# Patient Record
Sex: Female | Born: 1955 | Race: White | Hispanic: No | Marital: Single | Smoking: Former smoker
Health system: Southern US, Community
[De-identification: ages and names within clinical notes are randomized; demographics above are authoritative.]

## PROBLEM LIST (undated history)

## (undated) DIAGNOSIS — E079 Disorder of thyroid, unspecified: Secondary | ICD-10-CM

## (undated) DIAGNOSIS — M722 Plantar fascial fibromatosis: Secondary | ICD-10-CM

## (undated) DIAGNOSIS — C4490 Unspecified malignant neoplasm of skin, unspecified: Secondary | ICD-10-CM

## (undated) DIAGNOSIS — E7800 Pure hypercholesterolemia, unspecified: Secondary | ICD-10-CM

## (undated) DIAGNOSIS — E785 Hyperlipidemia, unspecified: Secondary | ICD-10-CM

## (undated) DIAGNOSIS — D179 Benign lipomatous neoplasm, unspecified: Secondary | ICD-10-CM

## (undated) DIAGNOSIS — F40240 Claustrophobia: Secondary | ICD-10-CM

## (undated) DIAGNOSIS — M545 Low back pain, unspecified: Secondary | ICD-10-CM

## (undated) DIAGNOSIS — R21 Rash and other nonspecific skin eruption: Secondary | ICD-10-CM

## (undated) DIAGNOSIS — J302 Other seasonal allergic rhinitis: Secondary | ICD-10-CM

## (undated) HISTORY — DX: Other seasonal allergic rhinitis: J30.2

## (undated) HISTORY — DX: Claustrophobia: F40.240

## (undated) HISTORY — PX: EYE SURGERY: SHX253

## (undated) HISTORY — PX: SKIN BIOPSY: SHX1

## (undated) HISTORY — DX: Hyperlipidemia, unspecified: E78.5

## (undated) HISTORY — DX: Plantar fascial fibromatosis: M72.2

## (undated) HISTORY — DX: Rash and other nonspecific skin eruption: R21

---

## 1996-05-18 ENCOUNTER — Emergency Department: Admit: 1996-05-18 | Payer: Self-pay | Admitting: Emergency Medicine

## 1999-04-23 ENCOUNTER — Ambulatory Visit
Admit: 1999-04-23 | Disposition: A | Discharge status: Home / Self Care | Payer: Self-pay | Source: Ambulatory Visit | Admitting: Gynecology

## 1999-05-07 ENCOUNTER — Ambulatory Visit
Admit: 1999-05-07 | Disposition: A | Discharge status: Home / Self Care | Payer: Self-pay | Source: Ambulatory Visit | Admitting: Gynecology

## 2000-08-10 ENCOUNTER — Ambulatory Visit
Admit: 2000-08-10 | Disposition: A | Discharge status: Home / Self Care | Payer: Self-pay | Source: Ambulatory Visit | Admitting: Gynecology

## 2001-06-09 HISTORY — PX: LASIK: SHX215

## 2001-11-04 ENCOUNTER — Emergency Department: Admit: 2001-11-04 | Payer: Self-pay | Source: Ambulatory Visit | Admitting: Emergency Medicine

## 2002-04-27 ENCOUNTER — Ambulatory Visit
Admit: 2002-04-27 | Disposition: A | Discharge status: Home / Self Care | Payer: Self-pay | Source: Ambulatory Visit | Admitting: Gynecology

## 2002-05-25 ENCOUNTER — Ambulatory Visit
Admit: 2002-05-25 | Disposition: A | Discharge status: Home / Self Care | Payer: Self-pay | Source: Ambulatory Visit | Admitting: Gynecology

## 2003-03-24 ENCOUNTER — Ambulatory Visit: Admit: 2003-03-24 | Disposition: A | Discharge status: Home / Self Care | Payer: Self-pay | Source: Ambulatory Visit

## 2003-05-05 ENCOUNTER — Ambulatory Visit
Admit: 2003-05-05 | Disposition: A | Discharge status: Home / Self Care | Payer: Self-pay | Source: Ambulatory Visit | Admitting: Gynecology

## 2004-05-23 ENCOUNTER — Ambulatory Visit
Admit: 2004-05-23 | Disposition: A | Discharge status: Home / Self Care | Payer: Self-pay | Source: Ambulatory Visit | Admitting: Gynecology

## 2005-05-05 ENCOUNTER — Ambulatory Visit
Admission: RE | Admit: 2005-05-05 | Disposition: A | Discharge status: Home / Self Care | Payer: Self-pay | Source: Ambulatory Visit | Admitting: Gastroenterology

## 2005-05-23 ENCOUNTER — Ambulatory Visit
Admit: 2005-05-23 | Disposition: A | Discharge status: Home / Self Care | Payer: Self-pay | Source: Ambulatory Visit | Admitting: Gynecology

## 2005-07-14 ENCOUNTER — Ambulatory Visit
Admit: 2005-07-14 | Disposition: A | Discharge status: Home / Self Care | Payer: Self-pay | Source: Ambulatory Visit | Admitting: Gynecology

## 2005-09-12 ENCOUNTER — Ambulatory Visit
Admit: 2005-09-12 | Disposition: A | Discharge status: Home / Self Care | Payer: Self-pay | Source: Ambulatory Visit | Admitting: Dermatology

## 2005-09-12 LAB — CBC WITH AUTO DIFFERENTIAL CERNER
Basophils Absolute: 0 /mm3 (ref 0.0–0.2)
Basophils: 1 % (ref 0–2)
Eosinophils Absolute: 0 /mm3 (ref 0.0–0.7)
Eosinophils: 1 % (ref 0–5)
Granulocytes Absolute: 2.8 /mm3 (ref 1.8–8.1)
Hematocrit: 35.2 % — ABNORMAL LOW (ref 37.0–47.0)
Hgb: 12 G/DL — ABNORMAL LOW (ref 13.0–17.0)
Lymphocytes Absolute: 1 /mm3 (ref 0.5–4.4)
Lymphocytes: 24 % (ref 15–41)
MCH: 34.4 PG — ABNORMAL HIGH (ref 28.0–32.0)
MCHC: 34.1 G/DL (ref 32.0–36.0)
MCV: 100.8 FL — ABNORMAL HIGH (ref 80.0–100.0)
MPV: 7.3 FL — ABNORMAL LOW (ref 7.4–10.4)
Monocytes Absolute: 0.3 /mm3 (ref 0.0–1.2)
Monocytes: 7 % (ref 0–11)
Neutrophils %: 68 % (ref 52–75)
Platelets: 280 /mm3 (ref 140–400)
RBC: 3.49 /mm3 — ABNORMAL LOW (ref 4.20–5.40)
RDW: 13.4 % (ref 11.5–15.0)
WBC: 4.1 /mm3 (ref 3.5–10.8)

## 2005-09-12 LAB — COMPREHENSIVE METABOLIC PANEL
ALT: 26 U/L (ref 9–52)
AST (SGOT): 26 U/L (ref 8–39)
Albumin/Globulin Ratio: 1.4 (ref 1.1–1.8)
Albumin: 3.9 G/DL (ref 3.7–5.1)
Alkaline Phosphatase: 51 U/L (ref 43–122)
BUN: 12 MG/DL (ref 7–21)
Bilirubin, Total: 0.2 MG/DL (ref 0.2–1.3)
CO2: 25 MEQ/L (ref 22–31)
Calcium: 8.9 MG/DL (ref 8.6–10.2)
Chloride: 105 MEQ/L (ref 98–107)
Creatinine: 0.9 MG/DL (ref 0.5–1.4)
Globulin: 2.8 G/DL (ref 2.0–3.7)
Glucose: 94 MG/DL (ref 70–105)
Potassium: 4.2 MEQ/L (ref 3.6–5.0)
Protein, Total: 6.7 G/DL (ref 6.0–8.0)
Sodium: 141 MEQ/L (ref 136–143)

## 2005-09-12 LAB — CALCIUM IONIZED-CALC. CERNER: Calcium Ionized Calculated: 2 mEQ/L (ref 1.9–2.3)

## 2005-09-12 LAB — GFR

## 2005-09-13 LAB — ANA SCREEN REFLEX

## 2005-09-14 LAB — SJOGREN'S SYNDROME AB CERNER

## 2005-09-15 LAB — STREPTOZYME HA: Streptozyme: NEGATIVE

## 2006-07-23 ENCOUNTER — Ambulatory Visit
Admit: 2006-07-23 | Disposition: A | Discharge status: Home / Self Care | Payer: Self-pay | Source: Ambulatory Visit | Admitting: Gynecology

## 2007-01-29 ENCOUNTER — Emergency Department: Admit: 2007-01-29 | Payer: Self-pay | Source: Emergency Department | Admitting: Emergency Medicine

## 2007-08-19 ENCOUNTER — Ambulatory Visit
Admit: 2007-08-19 | Disposition: A | Discharge status: Home / Self Care | Payer: Self-pay | Source: Ambulatory Visit | Admitting: Gynecology

## 2007-09-21 ENCOUNTER — Ambulatory Visit
Admit: 2007-09-21 | Disposition: A | Discharge status: Home / Self Care | Payer: Self-pay | Source: Ambulatory Visit | Admitting: Gynecology

## 2008-03-15 ENCOUNTER — Ambulatory Visit
Admit: 2008-03-15 | Disposition: A | Discharge status: Home / Self Care | Payer: Self-pay | Source: Ambulatory Visit | Admitting: Gynecology

## 2008-09-04 ENCOUNTER — Ambulatory Visit
Admit: 2008-09-04 | Disposition: A | Discharge status: Home / Self Care | Payer: Self-pay | Source: Ambulatory Visit | Admitting: Gynecology

## 2008-09-20 ENCOUNTER — Ambulatory Visit
Admit: 2008-09-20 | Disposition: A | Discharge status: Home / Self Care | Payer: Self-pay | Source: Ambulatory Visit | Admitting: Physician Assistant

## 2009-06-04 ENCOUNTER — Ambulatory Visit
Admit: 2009-06-04 | Disposition: A | Discharge status: Home / Self Care | Payer: Self-pay | Source: Ambulatory Visit | Admitting: Gynecology

## 2009-09-14 ENCOUNTER — Ambulatory Visit
Admit: 2009-09-14 | Disposition: A | Discharge status: Home / Self Care | Payer: Self-pay | Source: Ambulatory Visit | Admitting: Gynecology

## 2010-09-27 ENCOUNTER — Ambulatory Visit: Admit: 2010-09-27 | Discharge: 2010-09-27 | Payer: Self-pay | Source: Ambulatory Visit

## 2011-03-19 ENCOUNTER — Ambulatory Visit
Admit: 2011-03-19 | Discharge: 2011-03-19 | Disposition: A | Discharge status: Home / Self Care | Payer: Self-pay | Source: Ambulatory Visit

## 2011-10-02 ENCOUNTER — Ambulatory Visit
Admit: 2011-10-02 | Discharge: 2011-10-02 | Disposition: A | Discharge status: Home / Self Care | Payer: Self-pay | Source: Ambulatory Visit

## 2012-06-11 NOTE — Op Note (Signed)
PATIENTLEXA, [name]      MEDICAL RECORD NUMBER:         [id number]      PATIENT LOCATION/SERVICE:       [facility]            DATE OF SURGERY:               05/05/2005      SURGEON:                       Nadean Corwin, MD      ASSISTANT 1:            PREOPERATIVE DIAGNOSIS:  Hematochezia.            POSTOPERATIVE DIAGNOSIS:  Internal hemorrhoids.            PROCEDURE PERFORMED:  Colonoscopy.            ANESTHESIA:  IV general.            NOTE:  Informed consent was obtained prior to the procedure. Both risks and      benefits of the procedure were discussed with the patient. Potential      complications discussed included perforation, bleeding, abdominal pain,      adverse reaction to medications, and aspiration.  It was further explained      that any or all of these complications could result in the need for      extended hospitalization, emergency surgery, and/or transfusion with packed      red blood cells (with risk of HIV or hepatitis virus).  It was also      explained that an existing tumor, polyp, or mucosal abnormality might not      be identified at the time of the procedure, thus resulting in a missed      opportunity for early diagnosis and treatment of gastrointestinal      malignancy or disease as well as the development of a gastrointestinal      malignancy or disease.  Last it was explained that complications are not      limited to those listed above. The patient was given the opportunity to ask      questions and to have them answered as well as to discuss opportunity.            OPERATIVE FINDINGS:  The patient was placed in the left lateral decubitus      position, and conscious sedation was administered and titrated to comfort.      A rectal exam was performed which revealed normal tone and no masses.  The      Olympus PCF160AL video colonoscope was advanced under direct vision to the      level of the cecum.  The cecum was easily identified by internal  landmarks.      The appendiceal orifice and ileocecal valve were clearly visualized.  With      the colonoscope in the forward viewing position, it was slowly withdrawn      over a period of seven minutes. The cecum, ascending colon, hepatic      flexure, transverse colon, splenic flexure, descending colon, sigmoid      colon, rectosigmoid colon, as well as retroflexed view of the rectum, were      all fully visualized.  Retroflexed view of  the rectum revealed small,      nonbleeding internal hemorrhoids.  Other than this, there was a  normal      mucosal and vascular pattern throughout.  There were no ulcers, masses, or      polyps noted. The patient tolerated  the procedure well and was transported      to recovery without incident.            IMPRESSION:  Internal hemorrhoids.            RECOMMENDATIONS:      1.    The patient should have repeat colonoscopy in 7 to 10 years.      2.    The patient is to call me immediately post-procedure for nausea,            vomiting, abdominal pain, fever, shaking chills, or any change in            clinical condition.                  Electronic Signing MD: Nadean Corwin, MD                  DEL:amw:SC      D:    05/05/2005      T:    05/06/2005      #:    [id number]      N:    [id number]            cc:   Nadean Corwin, MD            Lurlean Leyden, MD

## 2012-09-30 ENCOUNTER — Other Ambulatory Visit: Payer: Self-pay | Admitting: Gynecology

## 2012-09-30 DIAGNOSIS — Z1231 Encounter for screening mammogram for malignant neoplasm of breast: Secondary | ICD-10-CM

## 2012-10-05 ENCOUNTER — Other Ambulatory Visit: Payer: Self-pay | Admitting: Surgery

## 2012-10-05 ENCOUNTER — Other Ambulatory Visit: Payer: Self-pay | Admitting: Gynecology

## 2012-10-05 ENCOUNTER — Ambulatory Visit
Admission: RE | Admit: 2012-10-05 | Discharge: 2012-10-05 | Disposition: A | Discharge status: Home / Self Care | Payer: BC Managed Care – PPO | Source: Ambulatory Visit | Attending: Gynecology | Admitting: Gynecology

## 2012-10-05 DIAGNOSIS — Z1231 Encounter for screening mammogram for malignant neoplasm of breast: Secondary | ICD-10-CM

## 2012-10-06 ENCOUNTER — Encounter (INDEPENDENT_AMBULATORY_CARE_PROVIDER_SITE_OTHER): Payer: Self-pay | Admitting: Family Medicine

## 2012-10-28 ENCOUNTER — Ambulatory Visit (INDEPENDENT_AMBULATORY_CARE_PROVIDER_SITE_OTHER): Payer: BC Managed Care – PPO | Admitting: Family Medicine

## 2012-10-28 ENCOUNTER — Encounter (INDEPENDENT_AMBULATORY_CARE_PROVIDER_SITE_OTHER): Payer: Self-pay | Admitting: Family Medicine

## 2012-10-28 VITALS — BP 132/84 | HR 94 | Temp 98.7°F | Resp 12 | Ht 61.5 in | Wt 126.0 lb

## 2012-10-28 NOTE — Progress Notes (Signed)
Subjective:       Patient ID: [name] [name] is a 57 y.o. female.    HPI  New patient.  Previously saw Dr. Baldwin [facility])    Reports has high cholesterol.  Reports has had for years.  Reports has been on lipitor off and on for a few years. ?maybe 10 years. Reports previous was given medication that started with z but reports it was ineffective.     Previous medical record was reviewed, patietn was previously given trilipix, fenofibrate, and zocor in the past.      Last lipid check in December 2013 and reports no change in med at that time.   From her previous lab results her triglycerides have been as high as ~ 450 and her LDL as low as single digit.     Reports that she saw gyn in 09/2012 and had endometrial biopsy. 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 Cutting. She was hesitant to return as she stated it was a painfulr procedure but states that Dr. Merdis Delay wanted her to return for another biopsy.     The following portions of the patient's history were reviewed and updated as appropriate: allergies, current medications, past family history, past medical history, past social history, past surgical history and problem list.    Review of Systems   Constitutional: Negative.    Respiratory: Negative for shortness of breath.    Cardiovascular: Negative for chest pain.   Musculoskeletal: Negative for myalgias.           Objective:    Physical Exam   Constitutional: She is oriented to person, place, and time.        Body mass index is 23.42 kg/(m^2).     Eyes: Conjunctivae normal are normal.   Neck: Neck supple. Carotid bruit is not present. No mass and no thyromegaly present.   Cardiovascular: Normal rate, regular rhythm, S1 normal and S2 normal.  Exam reveals no gallop and no friction rub.    No murmur heard.       There is no peripheral edema noted in the lower extremities.      Pulmonary/Chest: Effort normal and breath sounds normal.  She has no decreased breath sounds. She has no wheezes. She has no rhonchi. She has no rales.   Lymphadenopathy:     She has no cervical adenopathy.        Right: No supraclavicular adenopathy present.        Left: No supraclavicular adenopathy present.   Neurological: She is alert and oriented to person, place, and time.   Skin: Skin is warm and dry.     Records also reveal she previous saw Dr. Grier Rocher (gastro and LIpid disorders) and has previous sstarted on levothyroxine 50 mcg for TSh of 4.75 and 4.76.      Assessment:       1. Hypertriglyceridemia  CBC and differential, Comprehensive metabolic panel, Lipid panel, TSH with Free T4, Reflex   2. Hyperlipidemia LDL goal < 100  CBC and differential, Comprehensive metabolic panel, Lipid panel, TSH with Free T4, Reflex   3. Allergy, initial encounter             Plan:    advised to continue to f/u with physicians and midwives as recommended by DR. Cutting  Continue current rx  Continue dietary and lifestyle modification  Return in about 4 weeks (around 11/25/2012).

## 2012-10-29 ENCOUNTER — Encounter (INDEPENDENT_AMBULATORY_CARE_PROVIDER_SITE_OTHER): Payer: Self-pay | Admitting: Family Medicine

## 2012-11-20 LAB — COMPREHENSIVE METABOLIC PANEL
ALT: 26 U/L (ref 6–29)
AST (SGOT): 23 U/L (ref 10–35)
Albumin/Globulin Ratio: 1.6 (ref 1.0–2.5)
Albumin: 4 G/DL (ref 3.6–5.1)
Alkaline Phosphatase: 64 U/L (ref 33–130)
BUN: 12 MG/DL (ref 7–25)
Bilirubin, Total: 0.4 MG/DL (ref 0.2–1.2)
CO2: 23 mmol/L (ref 19–30)
Calcium: 9.1 MG/DL (ref 8.6–10.4)
Chloride: 106 mmol/L (ref 98–110)
Creatinine: 0.72 mg/dL (ref 0.50–1.05)
EGFR African American: 109 mL/min/{1.73_m2} (ref >=60)
EGFR: 94 mL/min/{1.73_m2} (ref >=60)
Globulin: 2.5 G/DL (ref 1.9–3.7)
Glucose: 94 MG/DL (ref 65–99)
Potassium: 4.3 mmol/L (ref 3.5–5.3)
Protein, Total: 6.5 G/DL (ref 6.1–8.1)
Sodium: 140 mmol/L (ref 135–146)

## 2012-11-20 LAB — T4, FREE: T4 Free: 0.9 ng/dL (ref 0.8–1.8)

## 2012-11-20 LAB — LIPID PANEL
Cholesterol / HDL Ratio: 1.7 (ref 0.0–5.0)
Cholesterol: 126 MG/DL (ref 125–200)
HDL: 73 MG/DL (ref >=46)
LDL Calculated: 17 MG/DL (ref <130)
Non HDL Cholesterol (LDL and VLDL): 53 mg/dL
Triglycerides: 179 MG/DL — ABNORMAL HIGH (ref <150)

## 2012-11-20 LAB — CBC AND DIFFERENTIAL
Atypical Lymphocytes %: 0 %
Baso(Absolute): 42 cells/uL (ref 0–200)
Basophils: 0.8 %
Eosinophils Absolute: 80 cells/uL (ref 15–500)
Eosinophils: 1.5 %
Hematocrit: 38 % (ref 35.0–45.0)
Hemoglobin: 12.6 g/dL (ref 11.7–15.5)
Lymphocytes Absolute: 1224 cells/uL (ref 850–3900)
Lymphocytes: 23.1 %
MCH: 32.5 pg (ref 27–33)
MCHC: 33 g/dL (ref 32–36)
MCV: 98 fL (ref 80–100)
MPV: 8.1 fL (ref 7.5–11.5)
Monocytes Absolute: 392 cells/uL (ref 200–950)
Monocytes: 7.4 %
Neutrophils Absolute: 3562 cells/uL (ref 1500–7800)
Neutrophils: 67.2 %
Platelets: 177 10*3/uL (ref 140–400)
RBC: 3.86 10*6/uL (ref 3.80–5.10)
RDW: 13.4 % (ref 11.0–15.0)
WBC: 5.3 10*3/uL (ref 3.8–10.8)

## 2012-11-20 LAB — TSH WITH REFLEX TO FREE T4: TSH: 4.98 mIU/L — ABNORMAL HIGH (ref 0.40–4.50)

## 2012-11-26 ENCOUNTER — Encounter (INDEPENDENT_AMBULATORY_CARE_PROVIDER_SITE_OTHER): Payer: Self-pay | Admitting: Family Medicine

## 2012-11-26 ENCOUNTER — Ambulatory Visit (INDEPENDENT_AMBULATORY_CARE_PROVIDER_SITE_OTHER): Payer: BC Managed Care – PPO | Admitting: Family Medicine

## 2012-11-26 VITALS — BP 152/80 | HR 80 | Temp 97.6°F | Resp 15 | Ht 61.5 in | Wt 127.0 lb

## 2012-11-26 DIAGNOSIS — E038 Other specified hypothyroidism: Secondary | ICD-10-CM

## 2012-11-26 DIAGNOSIS — R030 Elevated blood-pressure reading, without diagnosis of hypertension: Secondary | ICD-10-CM

## 2012-11-26 DIAGNOSIS — E785 Hyperlipidemia, unspecified: Secondary | ICD-10-CM

## 2012-11-26 DIAGNOSIS — E781 Pure hyperglyceridemia: Secondary | ICD-10-CM

## 2012-11-26 MED ORDER — ATORVASTATIN CALCIUM 10 MG PO TABS
10.0000 mg | ORAL_TABLET | Freq: Every day | ORAL | Status: DC
Start: 2012-11-26 — End: 2013-05-19

## 2012-11-28 ENCOUNTER — Encounter (INDEPENDENT_AMBULATORY_CARE_PROVIDER_SITE_OTHER): Payer: Self-pay | Admitting: Family Medicine

## 2012-11-28 NOTE — Progress Notes (Signed)
Subjective:       Patient ID: [name] [name] is a 57 y.o. female.    HPI  Subclinical hypothyroidism - she had "abnorma" thyroid test about 3 years ago.  Reports no previous medication.      Hyperlipidemia -   Lab Results   Component Value Date    CHOL 126 11/19/2012     Lab Results   Component Value Date    HDL 73 11/19/2012     Lab Results   Component Value Date    LDL 17 11/19/2012     Lab Results   Component Value Date    TRIG 179* 11/19/2012   diet - she "loves" carbohydrates.  Excessive consumption of pizza and pasta.  Drinks 2-3 glasses of winter per day.      Elevated bp - no home bp checks, but reports she does have a home bp cuff.    The following portions of the patient's history were reviewed and updated as appropriate: allergies, current medications, past family history, past medical history, past social history, past surgical history and problem list.    Review of Systems   Constitutional: Negative for fatigue.   HENT: Negative for sore throat and neck pain.    Respiratory: Negative for shortness of breath.    Cardiovascular: Negative for chest pain and palpitations.         Objective:    Physical Exam   Constitutional: She appears well-developed and well-nourished. No distress.        Elevated bp   Eyes: Conjunctivae normal and EOM are normal. Pupils are equal, round, and reactive to light.   Neck: Normal range of motion and phonation normal. No mass and no thyromegaly present.   Cardiovascular: Normal rate and regular rhythm.    Pulmonary/Chest: Effort normal and breath sounds normal.   Skin: She is not diaphoretic.     Wt Readings from Last 3 Encounters:   11/26/12 57.607 kg (127 lb)   10/28/12 57.153 kg (126 lb)     Body mass index is 23.61 kg/(m^2).        Assessment:       1. Hypertriglyceridemia  atorvastatin (LIPITOR) 10 MG tablet   2. Hyperlipidemia LDL goal < 100  atorvastatin (LIPITOR) 10 MG tablet   3. Subclinical hypothyroidism  Thyroid peroxidase antibody   4. Elevated blood pressure  reading without diagnosis of hypertension     no thyroid replacement indicated at this time, will order TPO to assess likely progression to overt hypothyroidism      Plan:       Provided patient with handouts and discussed dietary changes -   Reduce simple carbs  Reduce ETOH  Recommend about 2.5 hours per week of moderate intensity aerobic physical activity plus muscle strengthening activities that involve all major muscle groups on 2 or more days per week  Decrease atorvastatin 20 to 10 mg  Further recommendations pending test results.   Return in about 2 weeks (around 12/10/2012).  Will bring her home bp machine for comparison

## 2012-11-29 LAB — THYROID PEROXIDASE ANTIBODY: Thyroid Peroxidase (TPO) AB: <10 IU/mL

## 2012-12-14 ENCOUNTER — Ambulatory Visit (INDEPENDENT_AMBULATORY_CARE_PROVIDER_SITE_OTHER): Payer: BC Managed Care – PPO | Admitting: Family Medicine

## 2012-12-14 ENCOUNTER — Encounter (INDEPENDENT_AMBULATORY_CARE_PROVIDER_SITE_OTHER): Payer: Self-pay | Admitting: Family Medicine

## 2012-12-14 VITALS — BP 144/93 | HR 98 | Temp 98.7°F | Resp 12 | Ht 61.5 in | Wt 129.0 lb

## 2012-12-14 DIAGNOSIS — E039 Hypothyroidism, unspecified: Secondary | ICD-10-CM | POA: Insufficient documentation

## 2012-12-14 DIAGNOSIS — R030 Elevated blood-pressure reading, without diagnosis of hypertension: Secondary | ICD-10-CM | POA: Insufficient documentation

## 2012-12-14 NOTE — Progress Notes (Signed)
1. Have you self referred yourself since we last saw you? no  Refer to care team   Or   Add specialists:

## 2012-12-14 NOTE — Progress Notes (Signed)
Subjective:       Patient ID: [name] [name] is a 57 y.o. female.    HPI    HTN- home bp log brought in today.  Range [phone].  bp elevated in office today.  Taken with her bp monitor was 150/103.  States she is under a lot of stress.      HL- compliant with med    C/o bilateral whole foot pain on the bottom for > 1year.  Hurts to walk on the floor without shoes.  occasional shooting pain in feet.  Occasional cramps.  No previous injury.  No previous treatment.  Slightly worsening.     The following portions of the patient's history were reviewed and updated as appropriate: allergies, current medications, past family history, past medical history, past social history, past surgical history and problem list.    Review of Systems   Eyes: Negative for visual disturbance.   Musculoskeletal: Negative for myalgias and back pain.   Neurological: Negative for light-headedness and headaches.           Objective:    Physical Exam   Constitutional: No distress.        Elevated bp   Cardiovascular: Normal rate and regular rhythm.    Pulmonary/Chest: Effort normal and breath sounds normal.   Musculoskeletal:        Feet bilaterally no edema, there is tenderness of the plantar fascial insertion bilaterally.    No LE edema.  No deformity.    Walks independently.     Skin: She is not diaphoretic.           Assessment:       1. Elevated blood pressure reading without diagnosis of hypertension - white coat hypertension, continue home monitory twice weekly and return if >140/90   2. Hyperlipidemia LDL goal < 100  - continue atorvastatin 10 mg and dietary modification as previously reviewed.  Recheck cholesterol at 6 month f/u; order is in   3. Plantar fasciitis, bilateral  -avoid bare feet and flat shoes. Exercise discussed with patient and handout instructions given (toe curls, calf stretch, foot, ankle ROM exercise. Discussed other treatment options should worsen or no better; ibuprofen 600 mg q6-8 hours prn   4.  Subclinical hypothyroidism   - recheck TSH, T4 in 6 months.            Plan:       Return in about 6 months (around 06/16/2013).

## 2012-12-14 NOTE — Patient Instructions (Signed)
Plantar Fasciitis  The "plantar fascia" is a thick fibrous layer of tissue that covers the bones on the bottom of your foot. It supports the foot bones in an arched position. "Plantar fasciitis" is a painful inflammation of the plantar fascia. This can develop gradually or suddenly. It usually affects one foot at a time. Heel pain can be sharp and feel like a knife sticking in the bottom of your foot. Pain may occur after exercising, long distance jogging, stair climbing, long periods of standing, or after getting up from a seated position.  Risk factors include arthritis, diabetes, obesity or recent weight gain, flat-foot, high arch, wearing high heels or loose shoes or shoes with a poor arch support.  Foot pain from this condition is usually worse in the morning and improves with walking. By the end of the day there may be a dull aching. Treatment requires short-term rest and controlling inflammation. It may take up to nine months before all symptoms go away with the measures described below. Rarely, a steroid injection into the foot or surgery may be needed.  Home Care  1. If you are overweight, lose weight to promote healing.  2. Choose supportive shoes with good arch support and shock absorbency. Replace athletic shoes when they become worn out. Don't walk or run barefoot.  3. Shoe inserts are an important part of treatment. These will provide optimal arch support. While you can buy off-the-shelf shoe inserts inexpensively, the best ones are those made for you by a podiatrist (foot specialist).  4. Night splints (provided by a podiatrist) keep the heel stretched out while you sleep and prevent morning pain.  5. Avoid activities that stress the feet: jogging, prolonged standing or walking, contact sports, etc.  6. First thing in the morning and before sports, stretch the bottom of your feet. Gently flex your ankle so the foot moves toward your knee.  7. Icing may help control heel pain. Apply an ice pack (ice  cubes in a plastic bag, wrapped in a towel) to the heel for 10-20 minutes as a preventive or after an acute flare of symptoms. You may repeat this every 1-2 hours as needed.  8. You may use acetaminophen (Tylenol) or ibuprofen (Motrin, Advil) to control pain, unless another medicine was prescribed. [NOTE: If you have chronic liver or kidney disease or ever had a stomach ulcer or GI bleeding, talk with your doctor before using these medicines.]  Follow Up  with your doctor or a podiatrist (foot specialist) as advised by our staff. Call for an appointment if pain worsens or there is no relief after a few weeks of home treatment. Shoe inserts, a night splint or a special boot may be required.  [NOTE: If x-rays were taken, they will be reviewed by a radiologist. You will be notified of any new findings that may affect your care.]  Return Promptly  or contact your physician if any of the following occurs:   Foot swelling or redness with increasing pain         2000-2014 [employer], [address], [place] [address]. All rights reserved. This information is not intended as a substitute for professional medical care. Always follow your healthcare professional's instructions.

## 2013-05-19 ENCOUNTER — Other Ambulatory Visit (INDEPENDENT_AMBULATORY_CARE_PROVIDER_SITE_OTHER): Payer: Self-pay | Admitting: Family Medicine

## 2013-05-20 MED ORDER — ATORVASTATIN CALCIUM 10 MG PO TABS
10.0000 mg | ORAL_TABLET | Freq: Every day | ORAL | Status: DC
Start: 2013-05-19 — End: 2013-07-05

## 2013-05-25 ENCOUNTER — Telehealth (INDEPENDENT_AMBULATORY_CARE_PROVIDER_SITE_OTHER): Payer: Self-pay | Admitting: Family Medicine

## 2013-05-25 NOTE — Telephone Encounter (Signed)
Lm requesting pt call back to reschduale appt. Dr. Nechama Guard will no longer work on Mondays.

## 2013-06-20 ENCOUNTER — Ambulatory Visit (INDEPENDENT_AMBULATORY_CARE_PROVIDER_SITE_OTHER): Payer: BC Managed Care – PPO | Admitting: Family Medicine

## 2013-07-05 ENCOUNTER — Encounter (INDEPENDENT_AMBULATORY_CARE_PROVIDER_SITE_OTHER): Payer: Self-pay | Admitting: Family Medicine

## 2013-07-05 ENCOUNTER — Ambulatory Visit (INDEPENDENT_AMBULATORY_CARE_PROVIDER_SITE_OTHER): Payer: BC Managed Care – PPO | Admitting: Family Medicine

## 2013-07-05 VITALS — BP 134/80 | HR 98 | Temp 98.5°F | Resp 16 | Ht 61.5 in | Wt 129.4 lb

## 2013-07-05 DIAGNOSIS — E038 Other specified hypothyroidism: Secondary | ICD-10-CM

## 2013-07-05 DIAGNOSIS — L259 Unspecified contact dermatitis, unspecified cause: Secondary | ICD-10-CM

## 2013-07-05 DIAGNOSIS — R030 Elevated blood-pressure reading, without diagnosis of hypertension: Secondary | ICD-10-CM

## 2013-07-05 DIAGNOSIS — E785 Hyperlipidemia, unspecified: Secondary | ICD-10-CM

## 2013-07-05 DIAGNOSIS — L309 Dermatitis, unspecified: Secondary | ICD-10-CM

## 2013-07-05 DIAGNOSIS — E781 Pure hyperglyceridemia: Secondary | ICD-10-CM

## 2013-07-05 MED ORDER — ATORVASTATIN CALCIUM 10 MG PO TABS
10.0000 mg | ORAL_TABLET | Freq: Every day | ORAL | Status: DC
Start: 2013-07-05 — End: 2014-02-15

## 2013-07-05 NOTE — Progress Notes (Signed)
Has patient sought any care outside the [facility]? NO    -Refer to Care Team-    Or  List of Specialties

## 2013-07-05 NOTE — Patient Instructions (Addendum)
[phone] if you are having difficulty accessing your MyChart account.      Eating Heart-Healthy Food: Using the DASH Plan  Eating for your heart doesn't have to be hard or boring. You just need to know how to make healthier choices. The DASH eating plan has been developed to help you do just that. DASH stands for Dietary Approaches to Stop Hypertension. It is a plan that has been proven to be healthier for your heart and to lower your risk for high blood pressure. It can also help lower your risk for cancer, heart disease, osteoporosis, and diabetes.    Choosing from Each Food Group  Choose foods from each of the food groups below each day. Try to get the recommended number of servings for each food group. The serving numbers are based on a diet of 2,000 calories a day.Talk to your doctor if you're unsure about your calorie needs.      Grains  Servings: 7-8 a day  A serving is:   1 slice bread   1 ounce dry cereal   half a cup cooked rice or pasta  Best choices: Whole grains and any grains high in fiber. Vegetables  Servings: 4-5 a day  A serving is:   1 cup raw leafy vegetable   Half a cup cooked vegetable   Three-quarter cup vegetable juice  Best choices: Fresh or frozen vegetable prepared without too much added salt or fat.   Fruits  Servings: 4-5 a day  A serving is:   Three-quarter cup fruit juice   1 medium fruit   One-quarter cup dried fruit   One-half cup fresh, frozen, or canned fruit  Best choices: A variety of fresh fruits of different colors. Whole fruits are a much better choice than fruit juices. Low-fat or Fat Free Dairy  Servings: 2-3 a day  A serving is:   8 ounces milk   1 cup yogurt   One and a half ounces cheese  Best choices: Skim or 1% milk, low-fat or fat free yogurt or buttermilk, and low-fat cheeses.             Meat, Poultry, Fish  Servings: 2 or fewer a day  A serving is:   3 ounces cooked meat, poultry, or fish  Best choices: Lean meats and fish. Trim away  visible fat. Broil, roast, or boil instead of frying. Remove skin from poultry before eating. Nuts, Seeds, Beans  Servings: 4-5 a week  A serving is:   One third cup nuts (or one and a half ounces)   2 tablespoons sunflower seeds   Half a cup cooked beans  Best choices: "Dry roasted" nuts with no salt added, lentils, kidney beans, garbanzo beans, and whole pinto beans.   Fats and Oils  Servings: 2 a day  A serving is:   1 teaspoon vegetable oil   1 teaspoon soft margarine   1 tablespoon low-fat mayonnaise   1 teaspoon regular mayonnaise   2 tablespoons light salad dressing   1 tablespoon regular salad dressing  Best choices: Monounsaturated and polyunsaturated fats such as olive, canola, or safflower oil. Sweets  Servings: 5 a week or fewer  A serving is:   1 tablespoon sugar, maple syrup, or honey   1 tablespoon jam or jelly   1 half-ounce jelly beans (about 15)   8 ounces lemonade  Best choices: Dried fruit can be a satisfying sweet. Choose low-fat sweets when possible. And watch your serving sizes!  For more on the DASH eating plan, visit:  [employer]    [address], [address] [place], [place] [address]. All rights reserved. This information is not intended as a substitute for professional medical care. Always follow your healthcare professional's instructions.

## 2013-07-05 NOTE — Progress Notes (Addendum)
Subjective:       Patient ID: [name] [name] is a 58 y.o. female.  Chief Complaint   Patient presents with   . Follow-up     Hyperlipidemia   . Medication Refill     Lipitor   . Rash     right arm; right hip       HPI  HTN- not checking BP at home; states had a stressful drive in    HLD - diet has been good; noted that she gained weight.  States exercising regularly- uses treadmill and cardio machine 1 hour; went on cruise for 10 days;     Plantar fasciitis - doing the exercises so not as bad, not cramping like before.    Denies arthralgias, cold intolerance, constipation, depression, difficulty concentrating, dry skin, fatigue, hair thinning/loss, memory impairment, myalgias, or weakness    Noted red spot on right arm about 1 week ago less red now has been putting lotion.  Also similar spot on right hip. No pain no itch.    Patient Active Problem List    Diagnosis Date Noted   . Subclinical hypothyroidism 12/14/2012   . Elevated blood pressure reading without diagnosis of hypertension 12/14/2012   . Plantar fasciitis, bilateral 12/14/2012   . Hypertriglyceridemia 10/28/2012   . Hyperlipidemia LDL goal < 100 10/28/2012   . Allergy 10/28/2012     Dust and cats  Receiving immunotherapy       The following portions of the patient's history were reviewed and updated as appropriate: allergies, current medications, past family history, past medical history, past social history, past surgical history and problem list.    Review of Systems   Respiratory: Negative for shortness of breath.    Cardiovascular: Negative for chest pain.   Genitourinary: Negative for difficulty urinating.           Objective:    Physical Exam   Vitals reviewed.  Constitutional: She appears well-developed and well-nourished. No distress.   Neck: Carotid bruit is not present. No mass and no thyromegaly present.   Cardiovascular: Normal rate and regular rhythm.         There is no peripheral edema noted in the lower extremities.       Pulmonary/Chest: Effort normal and breath sounds normal.   Skin: She is not diaphoretic.          Wt Readings from Last 3 Encounters:   07/05/13 58.695 kg (129 lb 6.4 oz)   12/14/12 58.514 kg (129 lb)   11/26/12 57.607 kg (127 lb)           A/P:     1. Hyperlipidemia LDL goal < 100  Lipid panel    TSH with Free T4, Reflex    Urinalysis    High sensitivity CRP    atorvastatin (LIPITOR) 10 MG tablet   2. Subclinical hypothyroidism  TSH with Free T4, Reflex   3. Hypertriglyceridemia  atorvastatin (LIPITOR) 10 MG tablet   4. Elevated blood pressure reading without diagnosis of hypertension     5. Dermatitis  Possible nummular eczema vs tinea Use OTC hydrocortisone, if persists or worsens to return     Further recommendations pending test results.   Continue current rx  Heart healthy diet

## 2013-08-31 LAB — URINALYSIS
Bilirubin, UA: NEGATIVE
Blood, UA: NEGATIVE
Glucose, UA: NEGATIVE
Ketones UA: NEGATIVE
Leukocyte Esterase, UA: NEGATIVE
Nitrite, UA: NEGATIVE
Protein, UA: NEGATIVE
Urine Specific Gravity: 1.02 (ref 1.005–1.030)
Urobilinogen, Ur: 0.2 mg/dL (ref 0.0–1.9)
pH, UA: 6 (ref 5.0–7.5)

## 2013-08-31 LAB — LIPID PANEL, WITHOUT TOTAL CHOLESTEROL/HDL RATIO, SERUM
Cholesterol: 128 mg/dL (ref 100–199)
HDL: 67 mg/dL (ref >39)
LDL Calculated: 26 mg/dL (ref 0–99)
Triglycerides: 175 mg/dL — ABNORMAL HIGH (ref 0–149)
VLDL Calculated: 35 mg/dL (ref 5–40)

## 2013-08-31 LAB — AMBIG ABBREV LP DEFAULT

## 2013-08-31 LAB — HIGH SENSITIVITY CRP: C-Reactive Protein, High Sensitive: 2.63 mg/L (ref 0.00–3.00)

## 2013-09-01 LAB — REFLEX - T4,FREE (DIRECT): T4, Free: 0.97 ng/dL (ref 0.82–1.77)

## 2013-09-01 LAB — THYROID STIMULATING HORMONE (TSH), REFLEX ON ABNORMAL TO FREE T4, SERUM: TSH: 6.67 u[IU]/mL — ABNORMAL HIGH (ref 0.450–4.500)

## 2013-09-09 ENCOUNTER — Encounter (INDEPENDENT_AMBULATORY_CARE_PROVIDER_SITE_OTHER): Payer: Self-pay | Admitting: Family Medicine

## 2013-10-25 ENCOUNTER — Encounter (INDEPENDENT_AMBULATORY_CARE_PROVIDER_SITE_OTHER): Payer: Self-pay | Admitting: Family Medicine

## 2013-12-08 ENCOUNTER — Other Ambulatory Visit: Payer: Self-pay | Admitting: Obstetrics & Gynecology

## 2013-12-08 DIAGNOSIS — Z01419 Encounter for gynecological examination (general) (routine) without abnormal findings: Secondary | ICD-10-CM

## 2013-12-13 ENCOUNTER — Ambulatory Visit
Admission: RE | Admit: 2013-12-13 | Discharge: 2013-12-13 | Disposition: A | Discharge status: Home / Self Care | Payer: BC Managed Care – PPO | Source: Ambulatory Visit | Attending: Obstetrics & Gynecology | Admitting: Obstetrics & Gynecology

## 2013-12-13 ENCOUNTER — Ambulatory Visit: Payer: BC Managed Care – PPO

## 2013-12-13 DIAGNOSIS — Z01419 Encounter for gynecological examination (general) (routine) without abnormal findings: Secondary | ICD-10-CM | POA: Insufficient documentation

## 2013-12-16 ENCOUNTER — Ambulatory Visit
Admission: RE | Admit: 2013-12-16 | Discharge: 2013-12-16 | Disposition: A | Discharge status: Home / Self Care | Payer: BC Managed Care – PPO | Source: Ambulatory Visit | Attending: Allergy | Admitting: Allergy

## 2013-12-16 DIAGNOSIS — R233 Spontaneous ecchymoses: Secondary | ICD-10-CM | POA: Insufficient documentation

## 2013-12-16 DIAGNOSIS — R21 Rash and other nonspecific skin eruption: Secondary | ICD-10-CM | POA: Insufficient documentation

## 2013-12-16 LAB — PT AND APTT
PT INR: 1.1 (ref 0.9–1.1)
PT: 13.6 s (ref 12.6–15.0)
PTT: 24 s (ref 23–37)

## 2013-12-16 LAB — COMPREHENSIVE METABOLIC PANEL
ALT: 47 U/L (ref 0–55)
AST (SGOT): 41 U/L — ABNORMAL HIGH (ref 5–34)
Albumin/Globulin Ratio: 1.3 (ref 0.9–2.2)
Albumin: 3.6 g/dL (ref 3.5–5.0)
Alkaline Phosphatase: 72 U/L (ref 37–106)
BUN: 7 mg/dL (ref 7–19)
Bilirubin, Total: 0.7 mg/dL (ref 0.2–1.2)
CO2: 26 mEq/L (ref 22–29)
Calcium: 8.9 mg/dL (ref 8.5–10.5)
Chloride: 106 mEq/L (ref 100–111)
Creatinine: 0.8 mg/dL (ref 0.6–1.0)
Globulin: 2.7 g/dL (ref 2.0–3.6)
Glucose: 108 mg/dL — ABNORMAL HIGH (ref 70–100)
Potassium: 4.6 mEq/L (ref 3.5–5.1)
Protein, Total: 6.3 g/dL (ref 6.0–8.3)
Sodium: 143 mEq/L (ref 136–145)

## 2013-12-16 LAB — CBC AND DIFFERENTIAL
Basophils Absolute Automated: 0.02 10*3/uL (ref 0.00–0.20)
Basophils Automated: 0 %
Eosinophils Absolute Automated: 0.05 10*3/uL (ref 0.00–0.70)
Eosinophils Automated: 1 %
Hematocrit: 36.5 % — ABNORMAL LOW (ref 37.0–47.0)
Hgb: 12.1 g/dL (ref 12.0–16.0)
Immature Granulocytes Absolute: 0.01 10*3/uL
Immature Granulocytes: 0 %
Lymphocytes Absolute Automated: 0.65 10*3/uL (ref 0.50–4.40)
Lymphocytes Automated: 13 %
MCH: 33.6 pg — ABNORMAL HIGH (ref 28.0–32.0)
MCHC: 33.2 g/dL (ref 32.0–36.0)
MCV: 101.4 fL — ABNORMAL HIGH (ref 80.0–100.0)
MPV: 9.2 fL — ABNORMAL LOW (ref 9.4–12.3)
Monocytes Absolute Automated: 0.3 10*3/uL (ref 0.00–1.20)
Monocytes: 6 %
Neutrophils Absolute: 4.06 10*3/uL (ref 1.80–8.10)
Neutrophils: 80 %
Nucleated RBC: 0 /100 WBC (ref 0–1)
Platelets: 148 10*3/uL (ref 140–400)
RBC: 3.6 10*6/uL — ABNORMAL LOW (ref 4.20–5.40)
RDW: 13 % (ref 12–15)
WBC: 5.08 10*3/uL (ref 3.50–10.80)

## 2013-12-16 LAB — URINALYSIS, REFLEX TO MICROSCOPIC EXAM IF INDICATED
Bilirubin, UA: NEGATIVE
Blood, UA: NEGATIVE
Glucose, UA: NEGATIVE
Ketones UA: NEGATIVE
Leukocyte Esterase, UA: NEGATIVE
Nitrite, UA: NEGATIVE
Protein, UR: NEGATIVE
Specific Gravity UA: 1.004 (ref 1.001–1.035)
Urine pH: 6 (ref 5.0–8.0)
Urobilinogen, UA: NEGATIVE mg/dL

## 2013-12-16 LAB — SEDIMENTATION RATE: Sed Rate: 8 mm/Hr (ref 0–20)

## 2013-12-16 LAB — GFR: EGFR: >60

## 2013-12-19 LAB — IGE: Immunoglobulin E: 68 (ref <=114)

## 2013-12-20 ENCOUNTER — Encounter (INDEPENDENT_AMBULATORY_CARE_PROVIDER_SITE_OTHER): Payer: Self-pay | Admitting: Family Medicine

## 2013-12-23 ENCOUNTER — Encounter (INDEPENDENT_AMBULATORY_CARE_PROVIDER_SITE_OTHER): Payer: Self-pay | Admitting: Family Medicine

## 2014-02-15 ENCOUNTER — Other Ambulatory Visit (INDEPENDENT_AMBULATORY_CARE_PROVIDER_SITE_OTHER): Payer: Self-pay | Admitting: Family Medicine

## 2014-02-15 DIAGNOSIS — E781 Pure hyperglyceridemia: Secondary | ICD-10-CM

## 2014-02-15 MED ORDER — ATORVASTATIN CALCIUM 10 MG PO TABS
10.0000 mg | ORAL_TABLET | Freq: Every day | ORAL | Status: DC
Start: 2014-02-15 — End: 2014-03-09

## 2014-02-15 NOTE — Telephone Encounter (Signed)
Please schedule f/u appointment

## 2014-02-21 NOTE — Telephone Encounter (Signed)
Lm requesting pt contact the office in regards to med refill request. Pt needs fu appt

## 2014-03-09 ENCOUNTER — Encounter (INDEPENDENT_AMBULATORY_CARE_PROVIDER_SITE_OTHER): Payer: Self-pay | Admitting: Family Medicine

## 2014-03-09 ENCOUNTER — Ambulatory Visit (INDEPENDENT_AMBULATORY_CARE_PROVIDER_SITE_OTHER): Payer: BC Managed Care – PPO | Admitting: Family Medicine

## 2014-03-09 ENCOUNTER — Other Ambulatory Visit (FREE_STANDING_LABORATORY_FACILITY): Payer: BC Managed Care – PPO

## 2014-03-09 VITALS — BP 135/81 | HR 80 | Temp 98.7°F | Resp 16 | Ht 62.0 in | Wt 119.0 lb

## 2014-03-09 DIAGNOSIS — E038 Other specified hypothyroidism: Secondary | ICD-10-CM

## 2014-03-09 DIAGNOSIS — Z1331 Encounter for screening for depression: Secondary | ICD-10-CM

## 2014-03-09 DIAGNOSIS — Z23 Encounter for immunization: Secondary | ICD-10-CM

## 2014-03-09 DIAGNOSIS — D7589 Other specified diseases of blood and blood-forming organs: Secondary | ICD-10-CM

## 2014-03-09 DIAGNOSIS — E781 Pure hyperglyceridemia: Secondary | ICD-10-CM

## 2014-03-09 DIAGNOSIS — F1020 Alcohol dependence, uncomplicated: Secondary | ICD-10-CM

## 2014-03-09 DIAGNOSIS — Z1389 Encounter for screening for other disorder: Secondary | ICD-10-CM

## 2014-03-09 LAB — COMPREHENSIVE METABOLIC PANEL
ALT: 30 U/L (ref 0–55)
AST (SGOT): 27 U/L (ref 5–34)
Albumin/Globulin Ratio: 1.2 (ref 0.9–2.2)
Albumin: 3.6 g/dL (ref 3.5–5.0)
Alkaline Phosphatase: 57 U/L (ref 37–106)
BUN: 11 mg/dL (ref 7.0–19.0)
Bilirubin, Total: 0.5 mg/dL (ref 0.1–1.2)
CO2: 25 mEq/L (ref 21–30)
Calcium: 9.5 mg/dL (ref 8.5–10.5)
Chloride: 105 mEq/L (ref 100–111)
Creatinine: 0.8 mg/dL (ref 0.4–1.5)
Globulin: 2.9 g/dL (ref 2.0–3.7)
Glucose: 94 mg/dL (ref 70–100)
Potassium: 4.4 mEq/L (ref 3.5–5.3)
Protein, Total: 6.5 g/dL (ref 6.0–8.3)
Sodium: 142 mEq/L (ref 135–146)

## 2014-03-09 LAB — CBC AND DIFFERENTIAL
Basophils Absolute Automated: 0.02 10*3/uL (ref 0.00–0.20)
Basophils Automated: 0 %
Eosinophils Absolute Automated: 0.03 10*3/uL (ref 0.00–0.70)
Eosinophils Automated: 1 %
Hematocrit: 37.1 % (ref 37.0–47.0)
Hgb: 12 g/dL (ref 12.0–16.0)
Immature Granulocytes Absolute: 0.01 10*3/uL
Immature Granulocytes: 0 %
Lymphocytes Absolute Automated: 1.07 10*3/uL (ref 0.50–4.40)
Lymphocytes Automated: 23 %
MCH: 33.8 pg — ABNORMAL HIGH (ref 28.0–32.0)
MCHC: 32.3 g/dL (ref 32.0–36.0)
MCV: 104.5 fL — ABNORMAL HIGH (ref 80.0–100.0)
MPV: 10.4 fL (ref 9.4–12.3)
Monocytes Absolute Automated: 0.31 10*3/uL (ref 0.00–1.20)
Monocytes: 7 %
Neutrophils Absolute: 3.13 10*3/uL (ref 1.80–8.10)
Neutrophils: 68 %
Nucleated RBC: 0 /100 WBC (ref 0–1)
Platelets: 190 10*3/uL (ref 140–400)
RBC: 3.55 10*6/uL — ABNORMAL LOW (ref 4.20–5.40)
RDW: 13 % (ref 12–15)
WBC: 4.57 10*3/uL (ref 3.50–10.80)

## 2014-03-09 LAB — HEMOLYSIS INDEX: Hemolysis Index: 8 (ref 0–18)

## 2014-03-09 LAB — T4, FREE: T4 Free: 0.87 ng/dL (ref 0.70–1.48)

## 2014-03-09 LAB — LIPID PANEL
Cholesterol / HDL Ratio: 2.3
Cholesterol: 139 mg/dL (ref 0–199)
HDL: 60 mg/dL (ref >40)
LDL Calculated: 4 mg/dL (ref 0–99)
Triglycerides: 374 mg/dL — ABNORMAL HIGH (ref 34–149)
VLDL Calculated: 75 mg/dL — ABNORMAL HIGH (ref 10–40)

## 2014-03-09 LAB — VITAMIN B12: Vitamin B-12: 196 pg/mL — ABNORMAL LOW (ref 211–911)

## 2014-03-09 LAB — TSH: TSH: 3.63 u[IU]/mL (ref 0.35–4.94)

## 2014-03-09 LAB — FOLATE: Folate: 8.8 ng/mL

## 2014-03-09 LAB — GFR: EGFR: >60

## 2014-03-09 MED ORDER — ATORVASTATIN CALCIUM 10 MG PO TABS
10.0000 mg | ORAL_TABLET | Freq: Every day | ORAL | Status: DC
Start: 2014-03-09 — End: 2014-11-22

## 2014-03-09 NOTE — Progress Notes (Signed)
Subjective:       Patient ID: [name] [name] is a 58 y.o. female.    Chief Complaint   Patient presents with   . Medication Refill     Hypothyroidism  Presents for follow-up visit. Symptoms include depressed mood and weight loss. Patient reports no cold intolerance, constipation, fatigue or menstrual problem.   no family history of thyroid problems aside from a niece.     Broke up with her boyfriend of long duration about 5-6 months ago. Has caused great deal of stress, but has strong support network.     HLD - Has been working out more.  Working out almost daily cardio/treadmil, "matrix" weights.   Rare soda  Usually gatorade  Drinks wine > 2 glasses per day    Gyn wants her to stop HRT for a while.     Patient Active Problem List    Diagnosis Date Noted   . Subclinical hypothyroidism 12/14/2012   . Elevated blood pressure reading without diagnosis of hypertension 12/14/2012   . Plantar fasciitis, bilateral 12/14/2012   . Hypertriglyceridemia 10/28/2012   . Hyperlipidemia LDL goal < 100 10/28/2012   . Allergy 10/28/2012     Dust and cats  Receiving immunotherapy       The following portions of the patient's history were reviewed and updated as appropriate: allergies, current medications, past family history, past medical history, past social history, past surgical history and problem list.    Review of Systems   Constitutional: Positive for weight loss. Negative for fatigue.   Gastrointestinal: Negative for constipation.   Endocrine: Negative for cold intolerance.   Genitourinary: Negative for menstrual problem.           Objective:    Physical Exam  BP 135/81 mmHg  Pulse 80  Temp(Src) 98.7 F (37.1 C) (Oral)  Resp 16  Ht 1.575 m (5\' 2" )  Wt 53.978 kg (119 lb)  BMI 21.76 kg/m2  Wt Readings from Last 3 Encounters:   03/09/14 53.978 kg (119 lb)   07/05/13 58.695 kg (129 lb 6.4 oz)   12/14/12 58.514 kg (129 lb)     Lab Results   Component Value Date    WBC 5.08 12/16/2013    HGB 12.1 12/16/2013    HCT 36.5*  12/16/2013    PLT 148 12/16/2013    CHOL 128 08/30/2013    TRIG 175* 08/30/2013    HDL 67 08/30/2013    LDL 26 08/30/2013    ALT 47 12/16/2013    AST 41* 12/16/2013    NA 143 12/16/2013    K 4.6 12/16/2013    CL 106 12/16/2013    CREAT 0.8 12/16/2013    BUN 7 12/16/2013    CO2 26 12/16/2013    TSH 6.670* 08/30/2013    INR 1.1 12/16/2013    GLU 108* 12/16/2013       Component      Latest Ref Rng 11/19/2012 11/26/2012 08/30/2013   TSH      0.450 - 4.500 uIU/mL 4.98 (H)  6.670 (H)   T4 Free      0.82 - 1.77 ng/dL 0.9  1.61   Thyroid Peroxidase (TPO) AB      Less than 35 IU/mL  <10      Lab Results   Component Value Date    CHOL 128 08/30/2013    CHOL 126 11/19/2012     Lab Results   Component Value Date    HDL 67 08/30/2013  HDL 73 11/19/2012     Lab Results   Component Value Date    LDL 26 08/30/2013    LDL 17 11/19/2012     Lab Results   Component Value Date    TRIG 175* 08/30/2013    TRIG 179* 11/19/2012     Lab Results   Component Value Date    WBC 5.08 12/16/2013    HGB 12.1 12/16/2013    HCT 36.5* 12/16/2013    PLT 148 12/16/2013    CHOL 128 08/30/2013    TRIG 175* 08/30/2013    HDL 67 08/30/2013    LDL 26 08/30/2013    ALT 47 12/16/2013    AST 41* 12/16/2013    NA 143 12/16/2013    K 4.6 12/16/2013    CL 106 12/16/2013    CREAT 0.8 12/16/2013    BUN 7 12/16/2013    CO2 26 12/16/2013    TSH 6.670* 08/30/2013    INR 1.1 12/16/2013    GLU 108* 12/16/2013          Assessment:       1. Hypertriglyceridemia    2. Flu vaccine need    3. Macrocytosis    4. ETOHism    5. Screening for depression    6. Subclinical hypothyroidism          Plan:      Procedures      Hypertriglyceridemia  - atorvastatin (LIPITOR) 10 MG tablet; Take 1 tablet (10 mg total) by mouth daily.  - Comprehensive metabolic panel; Future  - Lipid panel; Future  Discussed reduction in ETOH  Dietary improvement   Limit high-fat foods containing saturated fats. These are animal fats found in meat, butter, and whole milk.    Limit trans fats, which are  found in many processed foods like chips and store-bought cookies    Cut back on drinks with added sugars, such as soda    Limit your alcohol intake    Exercise for at least 30 minutes a day, five days a week       Subclinical hypothyroidism  - TSH; Future  - T4, free; Future    Flu vaccine need  - Flu vaccine QUAD 3 yrs & greater PRESERVED IM    Macrocytosis  - Comprehensive metabolic panel; Future  - Lipid panel; Future  - TSH; Future  - T4, free; Future  - CBC and differential; Future  - Vitamin B12; Future  - Folate; Future      Depression screen  BMI level reviewed  Further recommendations pending test results.   Return in about 4 months (around 07/10/2014), or if symptoms worsen or fail to improve.

## 2014-03-12 DIAGNOSIS — D7589 Other specified diseases of blood and blood-forming organs: Secondary | ICD-10-CM | POA: Insufficient documentation

## 2014-03-12 DIAGNOSIS — F1090 Alcohol use, unspecified, uncomplicated: Secondary | ICD-10-CM | POA: Insufficient documentation

## 2014-03-15 ENCOUNTER — Encounter (INDEPENDENT_AMBULATORY_CARE_PROVIDER_SITE_OTHER): Payer: Self-pay | Admitting: Family Medicine

## 2014-03-15 DIAGNOSIS — E538 Deficiency of other specified B group vitamins: Secondary | ICD-10-CM | POA: Insufficient documentation

## 2014-03-15 NOTE — Progress Notes (Signed)
Update med list

## 2014-04-13 ENCOUNTER — Encounter (INDEPENDENT_AMBULATORY_CARE_PROVIDER_SITE_OTHER): Payer: Self-pay

## 2014-04-14 ENCOUNTER — Encounter (INDEPENDENT_AMBULATORY_CARE_PROVIDER_SITE_OTHER): Payer: Self-pay

## 2014-04-17 ENCOUNTER — Encounter (INDEPENDENT_AMBULATORY_CARE_PROVIDER_SITE_OTHER): Payer: Self-pay

## 2014-04-17 NOTE — Progress Notes (Signed)
Date 04/06/14  Accession Number [id number])  Location Lt Post Shoulder  Dx MM   Clark Level IV   Thickness 1.11mm   Miotic Rate 2 per mm   Ulceration Absent   Regression Absent

## 2014-04-19 ENCOUNTER — Ambulatory Visit (INDEPENDENT_AMBULATORY_CARE_PROVIDER_SITE_OTHER): Payer: BC Managed Care – PPO | Admitting: Surgery

## 2014-04-19 ENCOUNTER — Ambulatory Visit (INDEPENDENT_AMBULATORY_CARE_PROVIDER_SITE_OTHER): Payer: BC Managed Care – PPO | Admitting: Hematology & Oncology

## 2014-04-19 ENCOUNTER — Encounter (INDEPENDENT_AMBULATORY_CARE_PROVIDER_SITE_OTHER): Payer: Self-pay | Admitting: Dermatology

## 2014-04-19 ENCOUNTER — Ambulatory Visit: Payer: BC Managed Care – PPO | Attending: Surgery

## 2014-04-19 ENCOUNTER — Encounter (INDEPENDENT_AMBULATORY_CARE_PROVIDER_SITE_OTHER): Payer: Self-pay | Admitting: Hematology & Oncology

## 2014-04-19 ENCOUNTER — Ambulatory Visit (INDEPENDENT_AMBULATORY_CARE_PROVIDER_SITE_OTHER): Payer: BC Managed Care – PPO | Admitting: Dermatology

## 2014-04-19 VITALS — BP 138/66 | HR 89 | Temp 98.5°F | Ht 62.0 in | Wt 116.8 lb

## 2014-04-19 VITALS — BP 138/66 | HR 89 | Temp 98.5°F | Ht 62.0 in | Wt 116.6 lb

## 2014-04-19 DIAGNOSIS — C4359 Malignant melanoma of other part of trunk: Secondary | ICD-10-CM

## 2014-04-19 DIAGNOSIS — C4362 Malignant melanoma of left upper limb, including shoulder: Secondary | ICD-10-CM | POA: Insufficient documentation

## 2014-04-19 DIAGNOSIS — C439 Malignant melanoma of skin, unspecified: Secondary | ICD-10-CM

## 2014-04-19 LAB — HEMOLYSIS INDEX: Hemolysis Index: 2 (ref 0–18)

## 2014-04-19 LAB — LACTATE DEHYDROGENASE: LDH: 170 U/L (ref 125–331)

## 2014-04-19 NOTE — Progress Notes (Signed)
[facility]    Chief Complaint   Patient presents with   . dx MM lt shoulder     HISTORY OF PRESENT ILLNESS  [name] [name] is 58 y.o. female who noted a growing mole which became like "blister" over the past 5 months. Also noted bleeding from the lesion. Saw her dermatologist Dr. Christella Noa who recommended excisional biopsy which was performed by Dr. Webb Silversmith on 04/06/14. Pathology showed 1.47 mm thick, non-ulcerated melanoma with 2 mitoses/mm2. Peripheral margin was close with melanoma in situ. Patient denies any palpable lymph nodes. Patient denies any rash/itching, fatigue, weight loss, headache, cough, shortness of breath, nausea, vomiting, diarrhea or abdominal pain.       Current Outpatient Prescriptions   Medication Sig Dispense Refill   . Ascorbic Acid (VITAMIN C) 500 MG tablet Take 500 mg by mouth daily.     Marland Kitchen aspirin EC 81 MG EC tablet Take 81 mg by mouth daily.     Marland Kitchen atorvastatin (LIPITOR) 10 MG tablet Take 1 tablet (10 mg total) by mouth daily. 90 tablet 1   . Cholecalciferol (VITAMIN D-3) 5000 UNITS TABS Take by mouth.     . loratadine (CLARITIN) 10 MG tablet Take 10 mg by mouth daily.     . Omega-3 Fatty Acids (FISH OIL) 1200 MG CAPS Take 1,200 mg by mouth 2 (two) times daily.     Marland Kitchen PREMARIN 0.3 MG tablet Take 0.3 mg by mouth daily.      . progesterone (PROMETRIUM) 200 MG capsule Take 200 mg by mouth nightly.      . vitamin B-12 (CYANOCOBALAMIN) 1000 MCG tablet Take 1,000 mcg by mouth once a week.       No current facility-administered medications for this visit.     Allergies   Allergen Reactions   . Ciprofloxacin Swelling     Past Medical History   Diagnosis Date   . Seasonal allergies    . Asthma      reports given an inhaler one summer   . Hypertensive disorder    . Hyperlipidemia      Past Surgical History   Procedure Laterality Date   . Lasik       Family History   Problem Relation Age of Onset   . Breast cancer Maternal Grandmother    . Cancer Maternal Grandmother       breast   . Hypertension Father    . Hyperlipidemia Father    . Aneurysm Father    . Heart disease Father      unsure of age   . Thyroid disease Sister    . Asthma Brother    . Hyperlipidemia Brother    . ADD / ADHD Daughter    . Asthma Daughter    . Alcohol abuse Maternal Uncle    . Alcohol abuse Maternal Grandfather      History     Social History   . Marital Status: Single     Spouse Name: N/A     Number of Children: N/A   . Years of Education: N/A     Occupational History   . Not on file.     Social History Main Topics   . Smoking status: Former Smoker -- 1.00 packs/day for 35 years     Types: Cigarettes     Quit date: 10/29/2002   . Smokeless tobacco: Never Used   . Alcohol Use: 0.0 oz/week      Comment: drinks wine nightly (  2 glasses); very rare other alcohol   . Drug Use: No   . Sexual Activity:     Partners: Male     Birth Control/ Protection: None     Other Topics Concern   . Not on file     Social History Narrative       REVIEW OF SYSTEMS  All other systems were reviewed and are negative except as previously noted in the HPI.       Objective:     Filed Vitals:    04/19/14 1025   BP: 138/66   Pulse: 89   Temp: 98.5 F (36.9 C)   SpO2: 99%     ECOG PS: 0  General: Vitals Reviewed. NAD  Eyes: Anicteric, normal conjunctiva  ENT: No sores in mouth  CV: RRR  RESP: CTA B  GI: NT/ND, no HSM  Neuro: AAO x 3  MS: no swelling in arms or legs  SKIN: 3 cm excision scar in left shoulder without swelling, bleeding or tenderness. No evidence of satellite or in-transit lesion.      LYMPH: negative cervical suplarclavicular axillary inguinal.       Radiologic Studies None    Pathology reports   Date 04/06/14  Accession Number [id number])  Location Lt Post Shoulder  Dx MM   Clark Level IV   Thickness 1.53mm   Mitotic Rate 2 per mm   Ulceration Absent   Regression Absent       Assessment & Plan:   1. Melanoma: Stage 1B Left posterior shoulder   I discussed her case at melanoma tumor board. Second path review is  pending.    Based on outside pathology report, I discussed with patient about diagnosis, working stage and further recommendation for melanoma. Patient understands that the melanoma is a type of skin cancer which can be life-threatening if it spreads to distant organs. Based on today's history and exam, patient does not have clinical evidence of metastasis. Therefore, clinical staging of melanoma is IB (T2a). I ordered chest xray and LDH to assess for distant metastasis.  If those are normal, next step would be wide local excision of primary melanoma as well as sentinel lymph node biopsy which will provide pathologic nodal status. Patient is evaluated by surgeon today.  I will see patient postop to review the pathologic staging and further recommendation approximately in 4 weeks.     Tiney Rouge, MD   Director, Melanoma and Cutaneous Oncology Therapeutics and Research  [facility]  P [phone]

## 2014-04-19 NOTE — Progress Notes (Signed)
[place] [facility]  [facility]     CC:  New melanoma     HISTORY OF PRESENT ILLNESS  [name] [name] is 58 y.o. female with PMH of hypertriglyceridemia who comes in for a new diagnosis of malignant melanoma (1.47mm, nonulcerated, left shoulder).   About 5 months ago saw the lesion growing on her left shoulder. Saw her dermatologist Dr. Christella Noa who recommended excisional biopsy, performed by Dr. Webb Silversmith about two weeks ago.     No personal/family history of skin cancer.  No tanning bed use. No blistering sunburns.     Current Outpatient Prescriptions   Medication Sig Dispense Refill   . Ascorbic Acid (VITAMIN C) 500 MG tablet Take 500 mg by mouth daily.     Marland Kitchen aspirin EC 81 MG EC tablet Take 81 mg by mouth daily.     Marland Kitchen atorvastatin (LIPITOR) 10 MG tablet Take 1 tablet (10 mg total) by mouth daily. 90 tablet 1   . Cholecalciferol (VITAMIN D-3) 5000 UNITS TABS Take by mouth.     . loratadine (CLARITIN) 10 MG tablet Take 10 mg by mouth daily.     . Omega-3 Fatty Acids (FISH OIL) 1200 MG CAPS Take 1,200 mg by mouth 2 (two) times daily.     Marland Kitchen PREMARIN 0.3 MG tablet Take 0.3 mg by mouth daily.      . progesterone (PROMETRIUM) 200 MG capsule Take 200 mg by mouth nightly.      . vitamin B-12 (CYANOCOBALAMIN) 1000 MCG tablet Take 1,000 mcg by mouth once a week.       No current facility-administered medications for this visit.     Allergies   Allergen Reactions   . Ciprofloxacin Swelling     Past Medical History   Diagnosis Date   . Seasonal allergies    . Asthma      reports given an inhaler one summer   . Hypertensive disorder    . Hyperlipidemia      Past Surgical History   Procedure Laterality Date   . Lasik       Family History   Problem Relation Age of Onset   . Breast cancer Maternal Grandmother    . Cancer Maternal Grandmother      breast   . Hypertension Father    . Hyperlipidemia Father    . Aneurysm Father    . Heart disease Father      unsure of age   . Thyroid  disease Sister    . Asthma Brother    . Hyperlipidemia Brother    . ADD / ADHD Daughter    . Asthma Daughter    . Alcohol abuse Maternal Uncle    . Alcohol abuse Maternal Grandfather      History     Social History   . Marital Status: Single     Spouse Name: N/A     Number of Children: N/A   . Years of Education: N/A     Occupational History   . Not on file.     Social History Main Topics   . Smoking status: Former Smoker -- 1.00 packs/day for 35 years     Types: Cigarettes     Quit date: 10/29/2002   . Smokeless tobacco: Never Used   . Alcohol Use: 0.0 oz/week      Comment: drinks wine nightly (2 glasses); very rare other alcohol   . Drug Use: No   . Sexual Activity:  Partners: Male     Copy: None     Other Topics Concern   . Not on file     Social History Narrative       REVIEW OF SYSTEMS  Const - no fever no chills  CV - no chest pain  Resp- no cough  Neuro - no headache  Abd - no pain  Integ - no lesions of concern  Psych - no depression or anxiety  All other systems reviewed and negative    Objective:     Filed Vitals:    04/19/14 0953   BP: 138/66   Pulse: 89   Temp: 98.5 F (36.9 C)   SpO2: 99%     Constitutional: General appearance: NAD, conversant    HEENT: normocephalic, atraumatic. Conjunctiva, lids, lips, teeth, gums and oropharynx examined with no suspicious lesions    SKIN:A total body skin examination is completed including:   - palpation of scalp and inspection of hair of scalp, eyebrows, face, chest and extremities   - inspection of eccrine and apocrine glands.   - inspection and/or palpation of the skin and subcutaneous tissues with the following anatomic skin sites examined with the following findings:   1. Head including face - no lesions of concern  2. Neck  - no lesions of concern  3. Chest including breasts, and axillae - no lesions of concern  4. Abdomen - no lesions of concern  5. Genitalia, groin, buttocks - no lesions of concern  6. Back - 5cm right of midline and  5cm above T10 is a 1.2cm pink plaque (rec bx); on back, over atlas is an 8cm lipoma and at the 8:00 position there is a 1.5cm hypopigmented patch  7. Right upper extremity - no lesions of concern  8. Left upper extremity - posterior shoulder vertical scar site of melanoma excisional biopsy well healed (melanoma site)   9. Right lower extremity - no lesions of concern  10. Left lower extremity - no lesions of concern     - Dermatoscopy was used to evaluate the nevi.   - General skin: multiple brown macules, size 2-68mm, approximately 50 in number, distributed primarily on the trunk. Extensive photodamage and rhytids    LYMPHATIC:Examination of lymph node basins is completed, including the H and N, axilla and groin. There is no lymphadenopathy.    NECK: supple, no masses    CVS: no swelling, varicosities or edema    EXTREMITIES: inspection and palpation of digits and nails revealed no abnormalities    GI:  External anus without suspicious lesions    NEURO/PSYCH: alert and oriented, pleasant      Radiologic Studies None    Pathology reports   Date 04/06/14  Accession Number [id number])  Location Lt Post Shoulder  Dx MM   Clark Level IV   Thickness 1.59mm   Mitotic Rate 2 per mm   Ulceration Absent   Regression Absent       Assessment & Plan:   1. Melanoma: Stage 1B Left posterior shoulder   -- Based on the AJCC Staging Guidelines, this is a patient with at least Stage 1B melanoma.  We had the opportunity to review her diagnosis, stage, treatment and follow-up plan in detail. A diagram of the skin was used to discuss the context of Breslow's thickness and to review the biology of melanoma. The rationale for further skin excision and current indications for sentinel lymph node biopsy was reviewed with the patient.   --  We also discussed the potential adverse effects of surgery which will be reviewed in greater detail by the surgeon performing these procedures.   -- As part of this comprehensive consultation, the  patient was counseled on proper sun practices.  -- The patient will see her regular dermaologist for TBSE and LN exam in March and July 2016 and will return to see Dr. Ruel Favors and Dr. Crosby Oyster annually in November 2016.    2. Lesion on back  -- advise patient return to Dr Christella Noa for biopsy however she asked that it be excised at time of melanoma surgery.     3. Follow up:  March 2016 Dr Christella Noa  July 2016 Dr Christella Noa  Nov 2016 [facility] - Dr Ruel Favors (DermOnc) and Dr Crosby Oyster (Melanoma Oncology)        Marylene Land MD  Dermatology Resident PGY5      Gerre Ranum S. Ruel Favors, MD, FAAD  Medical Director, Melanoma and Skin Cancer Center  [facility]  [phone] (t)   [phone] (f)     I personally reviewed the patients history, pathology when relevant, and completed an independent skin examination. I agree with all components of this written note. I have made addendums where/if needed. S. Melquan Ernsberger.

## 2014-04-20 ENCOUNTER — Telehealth (INDEPENDENT_AMBULATORY_CARE_PROVIDER_SITE_OTHER): Payer: Self-pay

## 2014-04-20 NOTE — Telephone Encounter (Signed)
RN called pt re: cxr reviewed as normal. Pt appreciated the call and voiced no further concerns or questions

## 2014-04-20 NOTE — Progress Notes (Signed)
Subjective:   [name] [name] is a 58 y.o. 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 Roughly five months ago she became aware of a new skin lesion on her left shoulder. It started to enlarge and she saw her dermatologist, Dr. Christella Noa, who recommended excision. This was performed by Dr. Webb Silversmith on 04/06/14. The final pathology revealed a malignant melanoma, Breslow's depth of 1.47 with clear deep margin but in situ melanoma involving one margin. Mitotic rate was 2/mm2, no ulcerate, regression, or precursor lesion noted. She denies palpable adenopathy, headaches, weight loss, anorexia, or abdominal pain.        Past Medical History   Diagnosis Date   . Seasonal allergies    . Asthma      reports given an inhaler one summer   . Hypertensive disorder    . Hyperlipidemia      Past Surgical History   Procedure Laterality Date   . Lasik       Family History   Problem Relation Age of Onset   . Breast cancer Maternal Grandmother    . Cancer Maternal Grandmother      breast   . Hypertension Father    . Hyperlipidemia Father    . Aneurysm Father    . Heart disease Father      unsure of age   . Thyroid disease Sister    . Asthma Brother    . Hyperlipidemia Brother    . ADD / ADHD Daughter    . Asthma Daughter    . Alcohol abuse Maternal Uncle    . Alcohol abuse Maternal Grandfather      History     Social History   . Marital Status: Single     Spouse Name: N/A     Number of Children: N/A   . Years of Education: N/A     Occupational History   . Not on file.     Social History Main Topics   . Smoking status: Former Smoker -- 1.00 packs/day for 35 years     Types: Cigarettes     Quit date: 10/29/2002   . Smokeless tobacco: Never Used   . Alcohol Use: 0.0 oz/week      Comment: drinks wine nightly (2 glasses); very rare other alcohol   . Drug Use: No   . Sexual Activity:     Partners: Male     Birth Control/ Protection: None     Other Topics Concern   . Not on file     Social History Narrative      Ciprofloxacin  Current Outpatient Prescriptions   Medication Sig Dispense Refill   . Ascorbic Acid (VITAMIN C) 500 MG tablet Take 500 mg by mouth daily.     Marland Kitchen aspirin EC 81 MG EC tablet Take 81 mg by mouth daily.     Marland Kitchen atorvastatin (LIPITOR) 10 MG tablet Take 1 tablet (10 mg total) by mouth daily. 90 tablet 1   . Cholecalciferol (VITAMIN D-3) 5000 UNITS TABS Take by mouth.     . loratadine (CLARITIN) 10 MG tablet Take 10 mg by mouth daily.     . Omega-3 Fatty Acids (FISH OIL) 1200 MG CAPS Take 1,200 mg by mouth 2 (two) times daily.     Marland Kitchen PREMARIN 0.3 MG tablet Take 0.3 mg by mouth daily.      . progesterone (PROMETRIUM) 200 MG capsule Take 200 mg by mouth nightly.      Marland Kitchen  vitamin B-12 (CYANOCOBALAMIN) 1000 MCG tablet Take 1,000 mcg by mouth once a week.       No current facility-administered medications for this visit.       Review of Systems   Constitutional: Negative for fever, appetite change, fatigue and unexpected weight change.   HENT: Negative.    Eyes: Negative for photophobia.   Respiratory: Negative for chest tightness and shortness of breath.    Cardiovascular: Positive for chest pain. Negative for palpitations.   Gastrointestinal: Negative.    Genitourinary: Negative.    Skin: Negative for color change.   Allergic/Immunologic: Negative for immunocompromised state.   Neurological: Negative for seizures and headaches.   Hematological: Negative for adenopathy. Does not bruise/bleed easily.   Psychiatric/Behavioral: Negative.      Objective:     Filed Vitals:    04/19/14 1027   BP: 138/66   Pulse: 89   Temp: 98.5 F (36.9 C)   SpO2: 99%     Physical Exam   Constitutional: She is oriented to person, place, and time. She appears well-nourished.   HENT:   Head: Normocephalic and atraumatic.   Eyes: No scleral icterus.   Neck: Normal range of motion. Neck supple.   Cardiovascular: Normal rate and regular rhythm.    Pulmonary/Chest: Effort normal and breath sounds normal.   Abdominal: Bowel sounds are normal.    Musculoskeletal: Normal range of motion.   Neurological: She is alert and oriented to person, place, and time.   Skin: Skin is warm and dry.   Left posterior shoulder 3 cm vertical scar at melanoma biopsy site    5cm right of midline and 5cm above T10 is a 1.2cm pink plaque        Psychiatric: She has a normal mood and affect. Her behavior is normal. Judgment and thought content normal.   Nursing note and vitals reviewed.    Assessment:     1. Melanoma  Lactate dehydrogenase    X-ray chest PA and lateral     2. Atypical nevus right mid back  Previous RAD Imaging:  No results found.  Patient Active Problem List    Diagnosis Date Noted   . 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 Low vitamin B12 level 03/15/2014   . Alcohol use disorder 03/12/2014   . Macrocytosis 03/12/2014   . Subclinical hypothyroidism 12/14/2012   . Elevated blood pressure reading without diagnosis of hypertension 12/14/2012   . Plantar fasciitis, bilateral 12/14/2012   . Hypertriglyceridemia 10/28/2012   . Hyperlipidemia LDL goal < 100 10/28/2012   . Allergy 10/28/2012       Plan:     Orders Placed This Encounter   Procedures   . X-ray chest PA and lateral     Standing Status: Future      Number of Occurrences:       Standing Expiration Date: 04/20/2015     Order Specific Question:  Is the patient pregnant?     Answer:  No     Order Specific Question:  Reason for Exam:     Answer:  recently diagnosed melanoma; preop   . Lactate dehydrogenase     Standing Status: Future      Number of Occurrences: 1      Standing Expiration Date: 04/20/2015     [name] [name] was presented at our multidisciplinary tumor board. Her slides were not available for review. She was seen by Dr. Ruel Favors for a dermatology opinion and Dr. Crosby Oyster from  medical oncology, and a consensus treatment plan was reached. I had a detailed discussion with the patient about their diagnosis of melanoma. Regarding surgical management, there are two essential issues: the margin of  normal skin which should be taken in order to minimize the risk of a local recurrence, and the need to evaluate the regional lymphatic bed with a sentinel lymph node biopsy. I explained that a number of randomized trials have given US guidelines for the necessary margins: in situ melanoma can be managed with 0.5 cm margins, melanomas <1 mm can have a 1 cm margin, 1-2mm melanomas can have a 1-2 cm margin, and >2 mm melanomas should have 2 cm margins. Cosmetic allowances can be made depending on the location of the tumor. These can usually be closed primarily, but some may require rotational flaps or even skin grafts. Complications include but are not limited to bleeding, infection, hematoma or seroma formation, poor wound healing, and cosmetic deformity. Sometimes a drain is placed in the wound to evacuate fluid. I also discussed in some detail the concept of the sentinel lymph node biopsy, and I used a schematic drawing to clarify it. This is generally recommended for all intermediate thickness melanomas, as well as some high risk thin melanomas, and certain thick melanomas. I went over the tracers used (isosulfan blue and technetium-99), and their administration. We discussed the low incidence of false negative sentinel nodes, and that melanoma can also spread via the bloodstream. In patients who are found to have a negative sentinel node, no further evaluation of the nodal bed is generally necessary. In patients in whom the sentinel node cannot be identified (<5%), no further surgery is typically done on the nodal bed, but the decision may be made to follow this area with periodic ultrasound. However, in patients with a positive sentinel node, they would need to be scheduled for a completion lymph node dissection. The complication rate from the sentinel lymph node procedure is low, but could include bleeding, infection, hematoma or seroma formation, numbness, parasthesias and lymphedema. I also emphasized that  nodal recurrences or distant disease are still possible even with a negative sentinel lymph node.  I would see the patient about a week after surgery to look at the incision and to review the final pathology report.    In summary, for this clinical Stage IB patient, we have recommended a WLE with 1.5 cm margins and a SLNB. We will also removed the lesion on the right mid back. She would like to be scheduled.

## 2014-04-21 ENCOUNTER — Encounter (INDEPENDENT_AMBULATORY_CARE_PROVIDER_SITE_OTHER): Payer: Self-pay

## 2014-04-26 ENCOUNTER — Other Ambulatory Visit (INDEPENDENT_AMBULATORY_CARE_PROVIDER_SITE_OTHER): Payer: Self-pay | Admitting: Surgery

## 2014-04-26 DIAGNOSIS — C4362 Malignant melanoma of left upper limb, including shoulder: Secondary | ICD-10-CM

## 2014-05-13 ENCOUNTER — Telehealth: Payer: BC Managed Care – PPO

## 2014-05-13 NOTE — Pre-Procedure Instructions (Addendum)
Labs(cbc,cmp 03/09/14) WDL; CXR (04/19/14) WDL; no other pre-op testing required per guidelines

## 2014-05-16 ENCOUNTER — Encounter (INDEPENDENT_AMBULATORY_CARE_PROVIDER_SITE_OTHER): Payer: Self-pay | Admitting: Surgery

## 2014-05-17 ENCOUNTER — Encounter (INDEPENDENT_AMBULATORY_CARE_PROVIDER_SITE_OTHER): Payer: BC Managed Care – PPO | Admitting: Surgery

## 2014-05-17 ENCOUNTER — Encounter (INDEPENDENT_AMBULATORY_CARE_PROVIDER_SITE_OTHER): Payer: BC Managed Care – PPO | Admitting: Hematology & Oncology

## 2014-05-19 ENCOUNTER — Encounter
Admission: RE | Disposition: A | Discharge status: Home / Self Care | Payer: Self-pay | Source: Ambulatory Visit | Attending: Surgery

## 2014-05-19 ENCOUNTER — Ambulatory Visit
Admission: RE | Admit: 2014-05-19 | Discharge: 2014-05-19 | Disposition: A | Discharge status: Home / Self Care | Payer: BC Managed Care – PPO | Source: Ambulatory Visit | Attending: Surgery | Admitting: Surgery

## 2014-05-19 ENCOUNTER — Ambulatory Visit
Admission: RE | Admit: 2014-05-19 | Discharge: 2014-05-19 | Disposition: A | Discharge status: Home / Self Care | Payer: BC Managed Care – PPO | Source: Ambulatory Visit

## 2014-05-19 ENCOUNTER — Ambulatory Visit: Payer: Self-pay

## 2014-05-19 ENCOUNTER — Ambulatory Visit: Payer: BC Managed Care – PPO | Admitting: Anesthesiology

## 2014-05-19 ENCOUNTER — Other Ambulatory Visit: Payer: Self-pay

## 2014-05-19 ENCOUNTER — Ambulatory Visit: Payer: BC Managed Care – PPO | Admitting: Surgery

## 2014-05-19 DIAGNOSIS — D235 Other benign neoplasm of skin of trunk: Secondary | ICD-10-CM | POA: Insufficient documentation

## 2014-05-19 DIAGNOSIS — L989 Disorder of the skin and subcutaneous tissue, unspecified: Secondary | ICD-10-CM | POA: Insufficient documentation

## 2014-05-19 DIAGNOSIS — Z87891 Personal history of nicotine dependence: Secondary | ICD-10-CM | POA: Insufficient documentation

## 2014-05-19 DIAGNOSIS — C4362 Malignant melanoma of left upper limb, including shoulder: Secondary | ICD-10-CM

## 2014-05-19 DIAGNOSIS — E785 Hyperlipidemia, unspecified: Secondary | ICD-10-CM | POA: Insufficient documentation

## 2014-05-19 DIAGNOSIS — J309 Allergic rhinitis, unspecified: Secondary | ICD-10-CM | POA: Insufficient documentation

## 2014-05-19 HISTORY — PX: EXCISION, MELANOMA: SHX3990

## 2014-05-19 HISTORY — DX: Unspecified malignant neoplasm of skin, unspecified: C44.90

## 2014-05-19 HISTORY — PX: BIOPSY, SENTINEL NODE: SHX3265

## 2014-05-19 SURGERY — EXCISION, MELANOMA
Anesthesia: Anesthesia General | Site: Chest | Wound class: Clean

## 2014-05-19 MED ORDER — SODIUM CHLORIDE 0.9 % IV MBP
1.0000 g | INTRAVENOUS | Status: DC
Start: 2014-05-19 — End: 2014-05-19
  Administered 2014-05-19: 1 g via INTRAVENOUS

## 2014-05-19 MED ORDER — LACTATED RINGERS IV SOLN
INTRAVENOUS | Status: DC | PRN
Start: 2014-05-19 — End: 2014-05-19

## 2014-05-19 MED ORDER — ISOSULFAN BLUE 1 % SC SOLN
SUBCUTANEOUS | Status: DC | PRN
Start: 2014-05-19 — End: 2014-05-19
  Administered 2014-05-19: 2 mL via SUBCUTANEOUS

## 2014-05-19 MED ORDER — PROPOFOL INFUSION 10 MG/ML
INTRAVENOUS | Status: DC | PRN
Start: 2014-05-19 — End: 2014-05-19
  Administered 2014-05-19: 20 mg via INTRAVENOUS
  Administered 2014-05-19: 180 mg via INTRAVENOUS

## 2014-05-19 MED ORDER — LIDOCAINE-EPINEPHRINE 1 %-1:100000 IJ SOLN
INTRAMUSCULAR | Status: DC
Start: 2014-05-19 — End: 2014-05-19
  Filled 2014-05-19: qty 20

## 2014-05-19 MED ORDER — GABAPENTIN 100 MG PO CAPS
100.00 mg | ORAL_CAPSULE | Freq: Three times a day (TID) | ORAL | 0 refills | Status: AC
Start: 2014-05-19 — End: 2014-05-23
  Filled 2014-05-19: qty 10, 4d supply, fill #0

## 2014-05-19 MED ORDER — FENTANYL CITRATE 0.05 MG/ML IJ SOLN
INTRAMUSCULAR | Status: AC
Start: 2014-05-19 — End: ?
  Filled 2014-05-19: qty 2

## 2014-05-19 MED ORDER — LACTATED RINGERS IV SOLN
INTRAVENOUS | Status: DC
Start: 2014-05-19 — End: 2014-05-19
  Administered 2014-05-19: 1000 mL via INTRAVENOUS

## 2014-05-19 MED ORDER — FILTERED TECHNETIUM TC 99M SULFUR COLLOID
710.0000 | Freq: Once | Status: AC | PRN
Start: 2014-05-19 — End: 2014-05-19
  Administered 2014-05-19: 710 via INTRADERMAL
  Filled 2014-05-19: qty 20000

## 2014-05-19 MED ORDER — SODIUM CHLORIDE 0.9 % IR SOLN
Status: DC | PRN
Start: 2014-05-19 — End: 2014-05-19
  Administered 2014-05-19: 1000 mL

## 2014-05-19 MED ORDER — EPHEDRINE SULFATE 50 MG/ML IJ SOLN
INTRAMUSCULAR | Status: AC
Start: 2014-05-19 — End: ?
  Filled 2014-05-19: qty 1

## 2014-05-19 MED ORDER — BUPIVACAINE-EPINEPHRINE (PF) 0.25% -1:200000 IJ SOLN
INTRAMUSCULAR | Status: DC | PRN
Start: 2014-05-19 — End: 2014-05-19
  Administered 2014-05-19: 9 mL

## 2014-05-19 MED ORDER — ACETAMINOPHEN 500 MG PO TABS
1000.0000 mg | ORAL_TABLET | Freq: Once | ORAL | Status: AC
Start: 2014-05-19 — End: 2014-05-19
  Administered 2014-05-19: 1000 mg via ORAL

## 2014-05-19 MED ORDER — ACETAMINOPHEN 500 MG PO TABS
ORAL_TABLET | ORAL | Status: DC
Start: 2014-05-19 — End: 2014-05-19
  Filled 2014-05-19: qty 2

## 2014-05-19 MED ORDER — PHENYLEPHRINE 100 MCG/ML IV BOLUS (ANESTHESIA)
PREFILLED_SYRINGE | INTRAVENOUS | Status: AC
Start: 2014-05-19 — End: ?
  Filled 2014-05-19: qty 5

## 2014-05-19 MED ORDER — LIDOCAINE-EPINEPHRINE 1 %-1:100000 IJ SOLN
INTRAMUSCULAR | Status: DC | PRN
Start: 2014-05-19 — End: 2014-05-19
  Administered 2014-05-19: 9 mL

## 2014-05-19 MED ORDER — GABAPENTIN 300 MG PO CAPS
300.0000 mg | ORAL_CAPSULE | Freq: Once | ORAL | Status: AC
Start: 2014-05-19 — End: 2014-05-19
  Administered 2014-05-19: 300 mg via ORAL

## 2014-05-19 MED ORDER — DEXAMETHASONE SODIUM PHOSPHATE 20 MG/5ML IJ SOLN
INTRAMUSCULAR | Status: AC
Start: 2014-05-19 — End: ?
  Filled 2014-05-19: qty 5

## 2014-05-19 MED ORDER — FAMOTIDINE 20 MG/2ML IV SOLN
INTRAVENOUS | Status: DC
Start: 2014-05-19 — End: 2014-05-19
  Filled 2014-05-19: qty 2

## 2014-05-19 MED ORDER — ONDANSETRON HCL 4 MG/2ML IJ SOLN
INTRAMUSCULAR | Status: DC | PRN
Start: 2014-05-19 — End: 2014-05-19
  Administered 2014-05-19: 4 mg via INTRAVENOUS

## 2014-05-19 MED ORDER — LIDOCAINE HCL 2 % IJ SOLN
INTRAMUSCULAR | Status: DC | PRN
Start: 2014-05-19 — End: 2014-05-19
  Administered 2014-05-19: 50 mg

## 2014-05-19 MED ORDER — PHENYLEPHRINE 100 MCG/ML IV BOLUS (ANESTHESIA)
PREFILLED_SYRINGE | INTRAVENOUS | Status: DC | PRN
Start: 2014-05-19 — End: 2014-05-19
  Administered 2014-05-19 (×2): 100 ug via INTRAVENOUS

## 2014-05-19 MED ORDER — PROPOFOL 10 MG/ML IV EMUL
INTRAVENOUS | Status: AC
Start: 2014-05-19 — End: ?
  Filled 2014-05-19: qty 20

## 2014-05-19 MED ORDER — LIDOCAINE HCL (PF) 2 % IJ SOLN
INTRAMUSCULAR | Status: AC
Start: 2014-05-19 — End: ?
  Filled 2014-05-19: qty 5

## 2014-05-19 MED ORDER — BUPIVACAINE-EPINEPHRINE (PF) 0.25% -1:200000 IJ SOLN
INTRAMUSCULAR | Status: DC
Start: 2014-05-19 — End: 2014-05-19
  Filled 2014-05-19: qty 20

## 2014-05-19 MED ORDER — MIDAZOLAM HCL 2 MG/2ML IJ SOLN
INTRAMUSCULAR | Status: DC | PRN
Start: 2014-05-19 — End: 2014-05-19
  Administered 2014-05-19: 2 mg via INTRAVENOUS

## 2014-05-19 MED ORDER — ONDANSETRON HCL 4 MG/2ML IJ SOLN
INTRAMUSCULAR | Status: AC
Start: 2014-05-19 — End: ?
  Filled 2014-05-19: qty 2

## 2014-05-19 MED ORDER — ACETAMINOPHEN 500 MG PO TABS
1000.00 mg | ORAL_TABLET | Freq: Four times a day (QID) | ORAL | Status: AC
Start: 2014-05-19 — End: 2014-05-21

## 2014-05-19 MED ORDER — OXYCODONE HCL 5 MG PO TABS
5.0000 mg | ORAL_TABLET | ORAL | 0 refills | Status: DC | PRN
Start: 2014-05-19 — End: 2014-11-22
  Filled 2014-05-19: qty 20, 4d supply, fill #0

## 2014-05-19 MED ORDER — BUPIVACAINE HCL (PF) 0.5 % IJ SOLN
INTRAMUSCULAR | Status: DC
Start: 2014-05-19 — End: 2014-05-19
  Filled 2014-05-19: qty 20

## 2014-05-19 MED ORDER — MIDAZOLAM HCL 2 MG/2ML IJ SOLN
INTRAMUSCULAR | Status: AC
Start: 2014-05-19 — End: ?
  Filled 2014-05-19: qty 2

## 2014-05-19 MED ORDER — FENTANYL CITRATE 0.05 MG/ML IJ SOLN
INTRAMUSCULAR | Status: DC | PRN
Start: 2014-05-19 — End: 2014-05-19
  Administered 2014-05-19 (×5): 25 ug via INTRAVENOUS

## 2014-05-19 MED ORDER — FAMOTIDINE 10 MG/ML IV SOLN (WRAP)
20.0000 mg | Freq: Once | INTRAVENOUS | Status: AC
Start: 2014-05-19 — End: 2014-05-19
  Administered 2014-05-19: 20 mg via INTRAVENOUS

## 2014-05-19 MED ORDER — GABAPENTIN 300 MG PO CAPS
ORAL_CAPSULE | ORAL | Status: DC
Start: 2014-05-19 — End: 2014-05-19
  Filled 2014-05-19: qty 1

## 2014-05-19 MED ORDER — DEXAMETHASONE SODIUM PHOSPHATE 4 MG/ML IJ SOLN (WRAP)
INTRAMUSCULAR | Status: DC | PRN
Start: 2014-05-19 — End: 2014-05-19
  Administered 2014-05-19: 8 mg via INTRAVENOUS

## 2014-05-19 MED ORDER — ISOSULFAN BLUE 1 % SC SOLN
SUBCUTANEOUS | Status: DC
Start: 2014-05-19 — End: 2014-05-19
  Filled 2014-05-19: qty 5

## 2014-05-19 MED ORDER — EPHEDRINE SULFATE 50 MG/ML IJ SOLN
INTRAMUSCULAR | Status: DC | PRN
Start: 2014-05-19 — End: 2014-05-19
  Administered 2014-05-19: 11:00:00 10 mg via INTRAVENOUS

## 2014-05-19 MED ORDER — CEFAZOLIN 1 GM MBP (CNR)
Status: DC
Start: 2014-05-19 — End: 2014-05-19
  Filled 2014-05-19: qty 100

## 2014-05-19 SURGICAL SUPPLY — 77 items
ADHESIVE SKIN CLOSURE DERMABOND ADVANCED (Skin Closure) ×2
ADHESIVE SKIN CLOSURE DERMABOND ADVANCED .7 ML LIQUID APPLICATOR (Skin Closure) ×2 IMPLANT
ADHESIVE SKIN DERMABOND ADV (Skin Closure) ×1
ADHESIVE SKIN SURGISEAL .35ML (Suture) ×6 IMPLANT
APPLCATOR CHLORAPREP 26ML (Prep) ×3 IMPLANT
CLIP INTERNAL MEDIUM CHEVRON 6 CARTRIDGE (Clips) IMPLANT
CLIP INTERNAL MEDIUM CHEVRON 6 CARTRIDGE LIGATE TRIANGULATE CROSS (Clips) IMPLANT
CLIP INTERNAL SMALL CHEVRON 6 CARTRIDGE (Clips) ×2
CLIP INTERNAL SMALL CHEVRON 6 CARTRIDGE LIGATE TRIANGULATE CROSS (Clips) ×2 IMPLANT
CLIP TITANIUM LIGATING MED (Clips)
CLIP TITANIUM LIGATING SMALL (Clips) ×1
CNTNRW LID CLR 8OZ (Suction) ×1
CONTAINER SPEC 8OZ NS SNPON LID TRNLU (Suction) ×2 IMPLANT
COVER NEOGUARD SURGIBOOT (Drape) ×1
COVER TRANSDUCER L244 CM X W15.2 CM (Drape) ×2 IMPLANT
COVER TRANSDUCER L244 CM X W15.2 CM TELESCOPIC FOLD COLOR ELASTIC BAND (Drape) ×2 IMPLANT
DRAIN SUCTION ROUND 10F (Drain) IMPLANT
DRAPE CARDIOVASCULAR SURGICAL SMS 84IN 38IN (Drape) IMPLANT
DRAPE SRG SMS 84IN 38IN CVARTS 100X60IN (Drape)
DRAPE SURGICAL FANFOLD L98 IN X W72 IN (Procedure Accessories)
DRAPE SURGICAL FANFOLD L98 IN X W72 IN CONVERTORS TIBURON LARGE (Procedure Accessories) IMPLANT
DRAPE UNIVRSL SYS HEAVY DUTY (Drape)
DRESSING FLEXZAN 4X4 (Dressing) ×3 IMPLANT
DRESSING TEGADERM 4X4X3/4IN (Dressing) ×1
DRESSING TRANSPARENT L4 3/4 IN X W4 IN (Dressing) ×2
DRESSING TRANSPARENT L4 3/4 IN X W4 IN POLYURETHANE ADHESIVE (Dressing) ×2 IMPLANT
EVACUATOR WOUND SILICONE 100CC (Drain) IMPLANT
GLOVE SURG BIOGEL INDIC SZ 8.5 (Glove) ×3 IMPLANT
GLOVE SURG BIOGEL SZ 9.0 GREEN (Glove) ×3 IMPLANT
GOWN  XXXL  IMPERVIOUS (Gown) ×1
GOWN SURGICAL 3XL L49 IN POLYETHYLENE (Gown) ×2
GOWN SURGICAL 3XL L49 IN POLYETHYLENE BLUE LEVEL 4 IMPERVIOUS (Gown) ×2 IMPLANT
HRST DONUT HL-IN-ONE (Positioning Supplies) ×3 IMPLANT
KIT MAJOR FFX (Kits) ×3 IMPLANT
NEEDLE ELECTRODE (Cautery) IMPLANT
PAD ARMBOARD 20X8X2IN (Procedure Accessories) ×3 IMPLANT
PAD ELECTROSRG GRND REM W CRD (Procedure Accessories) ×3 IMPLANT
PAD PREP CUFF 24X41IN W 9IN (Prep) ×3 IMPLANT
SHEET LARGE DRAPE 72X86IN (Procedure Accessories)
SLEEVE CMPR MED KN LGTH KDL SCD 21- IN (Sleeve) ×2
SLEEVE COMPRESSION MEDIUM KNEE LENGTH KENDALL SEQUENTIAL OD21- IN (Sleeve) ×2 IMPLANT
SLEEVE SEQ COMP KNEE REGULAR (Sleeve) ×1
SOL NACL.9% 1000ML IRR NONLTX (Irrigation Solutions) ×1
SOLUTION IRRIGATION 0.9% SODIUM CHLORIDE (Irrigation Solutions) ×2
SOLUTION IRRIGATION 0.9% SODIUM CHLORIDE 1000 ML PLASTIC POUR BOTTLE (Irrigation Solutions) ×2 IMPLANT
SPONGE GAUZE L6 3/4 IN X W6 IN MEDIUM (Dressing) ×2 IMPLANT
SPONGE GAUZE L6 3/4 IN X W6 IN MEDIUM ABSORBENT FLUFF DRY CRINKLE (Dressing) ×2 IMPLANT
SPONGE LAP XRAY 18X18IN (Sponge)
SPONGE LAPAROTOMY L18 IN X W18 IN (Sponge)
SPONGE LAPAROTOMY L18 IN X W18 IN PREWASH WHITE (Sponge) IMPLANT
SPONGE PEANUT C5 HOLDER DISSECTOR XRAY (Sponge) ×2 IMPLANT
SPONGE PEANUT C5 HOLDER DISSECTOR XRAY DETECTABLE OD3/8 IN DUKAL (Sponge) ×2 IMPLANT
SPONGE PEANUT RADPQE STRL3/8IN (Sponge) ×1
SPONGE SUPER KRLX MED 6X6.75IN (Dressing) ×1
SPONGE SURGICAL L8 IN X W4 IN 12 PLY (Sponge) IMPLANT
SPONGE SURGICAL L8 IN X W4 IN 12 PLY RADIOPAQUE BAND VISTEC BLUE WHITE (Sponge) IMPLANT
SPONGE XRAY DET 4X8 ST (Sponge)
STOCKINETTE IMPERVIOUS LARGE (Procedure Accessories) IMPLANT
SUT VICRYL 3-0 SH 27IN (Suture) ×1
SUTURE COATED VICRYL 3-0 SH L27 IN BRAID (Suture) ×4 IMPLANT
SUTURE COATED VICRYL 3-0 SH L27 IN BRAID COATED VIOLET ABSORBABLE (Suture) ×2 IMPLANT
SUTURE ETHILON 4-0 PSL 30IN (Suture) IMPLANT
SUTURE MONOCRYL 4-0 PS2 27IN (Suture) IMPLANT
SUTURE SILK 3-0 BLK BR PS-1 18 (Suture) ×1
SUTURE SILK PERMA HAND BLACK 3-0 PS-1 (Suture) ×2
SUTURE SILK PERMA HAND BLACK 3-0 PS-1 L18 IN BRAID NONABSORBABLE (Suture) ×2 IMPLANT
SUTURE VICRYL 3-0 PS2 27IN (Suture) IMPLANT
SUTURE VICRYL 3-0 SH 27IN (Suture) ×1
SYRINGE 10CC COLEMN LUER LOCK (Syringes, Needles) ×3 IMPLANT
SYRINGE LUER-LOK 500 5ML (Syringes, Needles) ×3 IMPLANT
TAPE MICROPORE SURGICAL 3INX10YDS (Tape) ×1
TAPE SURGICAL L10 YD X W3 IN STANDARD (Tape) ×2
TAPE SURGICAL L10 YD X W3 IN STANDARD HYPOALLERGENIC BREATHABLE (Tape) ×2 IMPLANT
TOWEL STERILE REUSABLE 8PK (Procedure Accessories) ×3 IMPLANT
WRAP COBAN SLF-ADHRNT 4INX5YD (Procedure Accessories)
WRAP COMPRESSION L5 YD X W4 IN SELF (Procedure Accessories)
WRAP COMPRESSION L5 YD X W4 IN SELF ADHERENT ELASTIC LIGHTWEIGHT FULL (Procedure Accessories) IMPLANT

## 2014-05-19 NOTE — PACU (Signed)
waiting for pharmacy to fill out prescriptions.

## 2014-05-19 NOTE — Anesthesia Postprocedure Evaluation (Signed)
Anesthesia Post Evaluation    Patient: [name] [name]    Procedures performed: Procedure(s) with comments:  EXCISION, MELANOMA - LEFT SHOULDER MELANOMA WIDE LOCAL EXCISION, RIGHT MID-BACK ATYPICAL NEVUS EXCISION    BIOPSY, SENTINEL NODE -  SENTINEL LYMPH NODE BIOSPY W/ NUC MED @ 8:00AM,     Anesthesia type: General LMA    Patient location:Phase I PACU    Last vitals:   Filed Vitals:    05/19/14 1230   BP: 164/95   Pulse: 73   Temp:    Resp: 12   SpO2: 99%       Post pain: Patient not complaining of pain, continue current therapy      Mental Status:awake    Respiratory Function: tolerating face mask    Cardiovascular: stable    Nausea/Vomiting: patient not complaining of nausea or vomiting    Hydration Status: adequate    Post assessment: no apparent anesthetic complications and no reportable events

## 2014-05-19 NOTE — Brief Op Note (Signed)
BRIEF OP NOTE    Date Time: 05/19/2014 12:10 PM    Patient Name:   [name] [name]    Date of Operation:   05/19/2014    Providers Performing:   Surgeon(s) and Role:     * Lorenza Chick, MD - Primary     * Gary Fleet, MD - Resident - Assisting    Operative Procedure:   Procedure(s) with comments:  EXCISION, MELANOMA - LEFT SHOULDER MELANOMA WIDE LOCAL EXCISION, RIGHT MID-BACK ATYPICAL NEVUS EXCISION    BIOPSY, SENTINEL NODE -  SENTINEL LYMPH NODE BIOSPY W/ NUC MED @ 8:00AM,     Preoperative Diagnosis:   Pre-Op Diagnosis Codes:     * Malignant melanoma of shoulder, left [C43.62]     * Lesion of back [C43.59]    Postoperative Diagnosis:   * No post-op diagnosis entered *    Anesthesia:   General    Fluids (I/O):   EBL: min    Implants:   * No implants in log *    Drains:   None    Specimens:        SPECIMENS (last 24 hours)      Pathology Specimens       05/19/14 1000             Additional Information    Clinical Information MALIGNANT MELANOMA LEFT SHOULDER AND BACK       Send final report to: DR. HAFNER       Specimen Information    Specimen Testing Required Routine Pathology       Specimen ID  A       Specimen Description MELANOMA LEFT POSTERIOR SHOULDER (SHORT STITCH = SUPERIOR, LONG STITCH = LATERAL)       Specimen Information    Specimen Testing Required Routine Pathology       Specimen ID  B       Specimen Description SKIN LESION RIGHT MID-BACK (SHORT STITCH = SUPERIOR, LONG STITCH=LATERAL)       Specimen Information    Specimen Testing Required Routine Pathology       Specimen ID  C       Specimen Description SENTINEL [facility] NODE LEFT SUPRACLAVICULAR FOSSA             Findings:   3x7cm WLE of left posterior shoulder  2x3cm excision right back  +supraclavicular SLN identified and removed    Wound Class:   Clean    Complications:   none    Signed by: Holli [facility] OR

## 2014-05-19 NOTE — Op Note (Signed)
Procedure Date: 05/19/2014     Patient Type: A     SURGEON: Lorenza Chick MD  ASSISTANT:       FIRST ASSISTANT:     Ignacia Bayley MD     PREOPERATIVE DIAGNOSES:    1.  Malignant melanoma, left posterior shoulder.  2.  Suspicious skin lesion, right mid back.       POSTOPERATIVE DIAGNOSES:  1.  Malignant melanoma, left posterior shoulder.  2.  Suspicious skin lesion, right mid back.       TITLE OF PROCEDURE:     Wide local excision of melanoma, left posterior shoulder with intermediate  closure; excision of suspicious skin lesion, right mid back; intraoperative  lymphatic mapping, and sentinel lymph node biopsy left supraclavicular  fossa.     INDICATIONS:  This patient presented with an intermediate thickness melanoma of the left  posterior back,   On dermatologic evaluation she was also found to have a  skin lesion, which was deemed suspicious in the right mid back.  She was  evaluated at our multidisciplinary melanoma tumor board and the consensus  recommendation was for a wide local excision with 1.5 cm margins as well as  excision of the lesion on the right mid back, in addition to having a  sentinel lymph node biopsy of the melanoma.  In the preoperative holding  area the location of the suspicious lesion was confirmed by comparing a  photograph and with confirmation by the patient's daughter.  The site of  the posterior shoulder lesion was also marked.  Preoperative  lymphoscintigraphy in nuclear medicine conveyed to me directly by Dr.  Jamesetta Geralds showed that the sentinel node was located in the anterior,  supraclavicular fossa.     FINDINGS:  The sentinel lymph node was identified and removed.  The biopsy incision of  the melanoma measured 4 cm.  The dimensions were 3 x 7 cm.     DESCRIPTION OF PROCEDURE:  The patient was prepped and draped in the usual sterile fashion.  Isosulfan  blue, 2 ml, was injected intradermally around the biopsy incision of the  melanoma on the left posterior shoulder.  This was massaged  for 5 minutes.   The 2 back lesions were addressed first, with the patient in the right  lateral decubitus position.  I would note that the patient received  intravenous Ancef preoperatively and had SCDs for DVT prophylaxis.  A 1.5  cm margin was marked out around the melanoma biopsy site on the left  posterior shoulder with a total dimensions of the excised specimen of 3 x 7  cm.  After injecting local anesthesia, the incision was taken down sharply  into the subcutaneous fat and using the Bovie cautery was removed and  oriented with silk sutures.  Flaps were created using the Bovie cautery and  an intermediate closure was performed using interrupted 2-0 Vicryl,  followed by interrupted 4-0 nylon in the skin.  Next, the lesion on the  right midback was removed with grossly clear margins.  This specimen  measured approximately 2 x 1 cm.  This was closed with interrupted 3-0  Vicryl followed by interrupted 4-0 nylon.       The patient was then repositioned supine, reprepped and redraped.  I then  performed lymphatic mapping using the Neoprobe to identify the precise  location of the sentinel node, which was in the anterior supraclavicular  fossa just above the clavicle.  After administering local anesthesia, an  incision was made  over this area and taken down with the Bovie cautery  through the platysma.  Careful blunt dissection was then performed, at  which a hot blue-stained lymph node was identified.  This was removed using  a combination of the Bovie cautery as well as clamping off tissue and tying  it off with 3-0 Vicryl sutures.  After removing it.10 second counts of the  specimen were 6627, evaluation of the bed with the specimen removed yielded  a 10-second count of only 84, i.e., well below the 10% threshold to  identify the sentinel lymph node.  The incision was then closed with  interrupted 3-0 Vicryl followed by 4-0 Monocryl in a subcuticular closure.   Dermabond was applied.  Instrument and sponge  counts were reported as  correct.  The patient tolerated the procedure well and was taken to the  recovery room in stable condition.           D:  05/19/2014 11:53 AM by Dr. Lorenza Chick, MD [phone])  T:  05/19/2014 16:20 PM by Effie Berkshire      (Conf: [id number]) (Doc ID: [id number])

## 2014-05-19 NOTE — Anesthesia Preprocedure Evaluation (Signed)
Anesthesia Evaluation    AIRWAY    Mallampati: II    TM distance: >3 FB  Neck ROM: full  Mouth Opening:full   CARDIOVASCULAR    cardiovascular exam normal       DENTAL    no notable dental hx     PULMONARY    pulmonary exam normal     OTHER FINDINGS                      Anesthesia Plan    ASA 2     general                     intravenous induction   Detailed anesthesia plan: general LMA  Monitors/Adjuncts: other    Post Op: other  Post op pain management: per surgeon    informed consent obtained    Plan discussed with CRNA.                   ===============================================================  Inpatient Anesthesia Evaluation    Patient Name: [name] [name]  Surgeon: Lorenza Chick, MD  Patient Age / Sex: 58 y.o. / female    Medical History:     Past Medical History   Diagnosis Date   . Seasonal allergies    . Hyperlipidemia    . Malignant neoplasm of skin      Melanoma on left shoulder back       Past Surgical History   Procedure Laterality Date   . Lasik  2003         Allergies:     Allergies   Allergen Reactions   . Ciprofloxacin Swelling         Medications:     Current Facility-Administered Medications   Medication Dose Route Frequency Last Rate Last Dose   . acetaminophen (TYLENOL) tablet 1,000 mg  1,000 mg Oral Once       . bupivacaine (PF) (MARCAINE) 0.5 % injection           . bupivacaine-EPINEPHrine (PF) (SENSORCAINE/EPINEPHRINE) 0.25% -1:200000 injection           . ceFAZolin (ANCEF) 1 g in sodium chloride 0.9 % 100 mL IVPB mini-bag plus  1 g Intravenous 60 Min Pre-Op       . famotidine (PEPCID) injection 20 mg  20 mg Intravenous Once       . gabapentin (NEURONTIN) capsule 300 mg  300 mg Oral Once       . isosulfan blue (LYMPHAZURIN) 1 % injection           . lactated ringers infusion   Intravenous Continuous       . lidocaine-EPINEPHrine (XYLOCAINE W/EPI) 1 %-1:100000 injection                      Prior to Admission medications    Medication Sig Start Date End Date Taking? Authorizing  Provider   Ascorbic Acid (VITAMIN C) 500 MG tablet Take 500 mg by mouth daily.   Yes [provider]   aspirin EC 81 MG EC tablet Take 81 mg by mouth daily.   Yes [provider]   atorvastatin (LIPITOR) 10 MG tablet Take 1 tablet (10 mg total) by mouth daily. 03/09/14  Yes Bauer, Christina A, DO   Cholecalciferol (VITAMIN D-3) 5000 UNITS TABS Take by mouth.   Yes [provider]   loratadine (CLARITIN) 10 MG tablet Take 10 mg by mouth daily.   Yes  [provider]   Omega-3 Fatty Acids (FISH OIL) 1200 MG CAPS Take 1,200 mg by mouth 2 (two) times daily.   Yes [provider]   PREMARIN 0.3 MG tablet Take 0.3 mg by mouth daily.  10/04/12  Yes [provider]   progesterone (PROMETRIUM) 200 MG capsule Take 200 mg by mouth nightly.  10/04/12  Yes [provider]   vitamin B-12 (CYANOCOBALAMIN) 1000 MCG tablet Take 1,000 mcg by mouth once a week.   Yes [provider]     Vitals   Temp:  [36.3 C (97.3 F)] 36.3 C (97.3 F)  Heart Rate:  [79] 79  Resp Rate:  [16] 16  BP: (155)/(87) 155/87 mmHg    Wt Readings from Last 3 Encounters:   05/13/14 51.71 kg (114 lb)   04/19/14 52.889 kg (116 lb 9.6 oz)   04/19/14 52.98 kg (116 lb 12.8 oz)     BMI (Estimated body mass index is 20.85 kg/(m^2) as calculated from the following:    Height as of this encounter: 1.575 m (5\' 2" ).    Weight as of this encounter: 51.71 kg (114 lb).)  Temp Readings from Last 3 Encounters:   05/19/14 36.3 C (97.3 F) Temporal Artery   04/19/14 36.9 C (98.5 F) Oral   04/19/14 36.9 C (98.5 F) Oral     BP Readings from Last 3 Encounters:   05/19/14 155/87   04/19/14 138/66   04/19/14 138/66     Pulse Readings from Last 3 Encounters:   05/19/14 79   04/19/14 89   04/19/14 89           Labs:   CBC:  Lab Results   Component Value Date    WBC 4.57 03/09/2014    HGB 12.0 03/09/2014    HCT 37.1 03/09/2014    PLT 190 03/09/2014       Chemistries:  Lab Results   Component Value Date    NA 142  03/09/2014    K 4.4 03/09/2014    CL 105 03/09/2014    CO2 25 03/09/2014    BUN 11.0 03/09/2014    CREAT 0.8 03/09/2014    GLU 94 03/09/2014    CA 9.5 03/09/2014    AST 27 03/09/2014       Coags:  Lab Results   Component Value Date    PT 13.6 12/16/2013    PTT 24 12/16/2013    INR 1.1 12/16/2013     _____________________      Signed by: Florestine Avers  05/19/2014   9:28 AM    =============================================================

## 2014-05-19 NOTE — PACU (Signed)
Pt verbalized understanding regarding discharge, will follow with MD in office.

## 2014-05-19 NOTE — H&P (Signed)
General Surgery R3    History and physical from office visit on 04/19/14 reviewed, no changes.  Pt has been well.  Denies cp, sob, n/v, f/c.      Filed Vitals:    05/19/14 0713   BP: 155/87   Pulse: 79   Temp: 97.3 F (36.3 C)   Resp: 16   SpO2: 100%     Gen A+Ox3, NAD   CVS RRR   Pulm CTAB  Abd Soft NT ND  Ext No edema/calf tenderness     A/P [name] [name] is a 58 y.o.  female presenting for wide local excision of left shoulder melanoma, SLNB, and back lesion excisional biopsy.  Discussed risks, benefits, and alternatives with patient who understands, has had all questions answered, and would like to proceed with surgery.   - To OR for planned procedure  - Consent signed  - Preop Ancef, neurontin, tylenol  - SCDs  - Pt to be seen by Dr. Milinda Pointer, MD  General Surgery PGY3  [phone]  Patient was reexamined within 24 hours of surgery and no changes noted. Proceed with WLE melanoma L upper back, excision of lesion right mid back, and SLNB in left supraclavicular fossa. She was told the location of the node in the supraclavicular fossa may be problematic.     Floyde Parkins. Willeen Cass MD, FACS

## 2014-05-19 NOTE — Transfer of Care (Signed)
Anesthesia Transfer of Care Note    Patient: [name] [name]    Procedures performed: Procedure(s) with comments:  EXCISION, MELANOMA - LEFT SHOULDER MELANOMA WIDE LOCAL EXCISION, RIGHT MID-BACK ATYPICAL NEVUS EXCISION    BIOPSY, SENTINEL NODE -  SENTINEL LYMPH NODE BIOSPY W/ NUC MED @ 8:00AM,     Anesthesia type: General LMA    Patient location:Phase I PACU    Last vitals:   Filed Vitals:    05/19/14 1211   BP: 155/80   Pulse: 77   Temp:    Resp: 16   SpO2: 99%       Post pain: Patient not complaining of pain, continue current therapy      Mental Status:awake    Respiratory Function: tolerating face mask    Cardiovascular: stable    Nausea/Vomiting: patient not complaining of nausea or vomiting    Hydration Status: adequate    Post assessment: no apparent anesthetic complications and no reportable events

## 2014-05-19 NOTE — OR Nursing (Signed)
PT REPOSITIONED FROM RIGHT LATERAL POSITION TO SUPINE POSITION AT 1113 FOR SECOND PART OF PROCEDURE PER DR. HAFNER

## 2014-05-19 NOTE — Discharge Instructions (Signed)
Take tylenol 1000mg  every 6 hr x 48 hrs post op and then every 8 hr as needed for pain (Do not exceed 4000mg  of tylenol in a 24hr period)    Take Neurontin every 8 hr x 48 hrs post op and then every 8 hr as needed for pain.  Hold Neurontin for excessive drowsiness.    Can take oxycodone 1-2 pills (5-10mg ) every 4 hrs as needed for pain if the above medications does not control the pain adequately.    Surgical Discharge Instructions:    Procedure Date:  05/19/2014  Procedure:  EXCISION, MELANOMA, BIOPSY, SENTINEL NODE  Surgeon: Lorenza Chick, MD    Wound Care:  keep wound clean and dry  You have glue or surgical tapes on the skin.  The stitches under the skin will dissolve.       Pain Control:  Some pain and swelling is normal after surgery.  You may also ice your incision with an ice pack wrapped in a towel for 20 min as needed.     You will be given a prescription for a narcotic pain medicine; a stool softener (which may be bought over-the-counter) may be taken while using the narcotic.  After 2-5 days many patients are able to wean themselves from the narcotic medications.  You do not have to use the pain pills if your discomfort is minimal.   Many people find that Nonsteroidal Antiinflammatory drugs will help relieve their discomfort very well, without the narcotic side effects, especially if they are taken routinely for the first few days after surgery.  You may choose to take:   Ibuprofen (Advil, Motrin): 600-800mg  (3-4 of the over-the-counter pills) every 8 hours around-the-clock, with some food)   Naproxen (Aleve, Naprosyn): 250-500mg  (1-2 pills) every 12 hours around-the-clock, with some food   Celecoxib (Celebrex): 100-200mg  twice a day, around-the-clock   Acetaminophen (Tylenol): 650mg -1gm every 6 hours   Please check with your physician before using these medications if you have chronic liver or kidney diseases    Activity:   You may walk without restrictions after leaving the hospital.  Bedrest  is not encouraged; you should be walking a few times a day at least.     Driving:  You may drive when you are no longer taking narcotic pain medications.    Bathing:    You may shower 24 hours after surgery. The wound may be washed gently with soap and water. Gently blot or dab dry following cleansing. Do not take baths, use swimming pools, or use hot tubs for ten days.    Diet: regular diet  You may not feel hungry all them time or you may eat a small amount and feel full.  This is ok.  Some people find eating multiple small meals is easier than 3 large meals.  Continue to drink lots of clear fluids.    Over the Counter Medications:  You may taken an over the counter stool softener while you are taking narcotic pain medications.    If you do have trouble with constipation from the pain medication, you may use an over the counter laxative such as Milk of Magnesia, Magnesium Citrate or Miralax.   A high-fiber diet (which includes beans, bran, whole grains, peas, dried fruit, fresh fruits such as as berries, plums, pears, apples, corn, broccoli, dark leafy greens and nuts) and drinking a lot water can help prevent constipation.  Drinking a tablespoon of psyllium power (such as Metamucil, Konsyl, Fibercon, Benefiber) mixed with  water or juice in the mornings is a good way to get enough fiber.    If you develop diarrhea, you may try an over-the-counter anti-diarrhea medication from the drugstore.  Please let us know on your follow-up visit if you have persistent diarrhea.    Medications to Resume Later:  n/a    Call the doctor if:   Fever greater than 102 F (38.9 C)   Pus draining from your wound   You notice a foul smell coming from a wound or dressing.   Redness around your wound that is spreading.   There is a breaking open of the wound (edges not staying together).   Pain not controlled with medications   Persistent nausea/vomiting    Dizzy episodes or fainting while standing.    SEEK IMMEDIATE MEDICAL  CARE IF:  You develop a rash.  You have difficulty breathing.  You have chest pain  You develop any reaction or side effects to medications given.

## 2014-05-21 ENCOUNTER — Encounter: Payer: Self-pay | Admitting: Surgery

## 2014-05-21 DIAGNOSIS — L989 Disorder of the skin and subcutaneous tissue, unspecified: Secondary | ICD-10-CM | POA: Insufficient documentation

## 2014-05-23 LAB — LAB USE ONLY - HISTORICAL SURGICAL PATHOLOGY

## 2014-05-25 ENCOUNTER — Encounter (INDEPENDENT_AMBULATORY_CARE_PROVIDER_SITE_OTHER): Payer: Self-pay

## 2014-05-31 ENCOUNTER — Ambulatory Visit (INDEPENDENT_AMBULATORY_CARE_PROVIDER_SITE_OTHER): Payer: BC Managed Care – PPO | Admitting: Hematology & Oncology

## 2014-05-31 ENCOUNTER — Encounter (INDEPENDENT_AMBULATORY_CARE_PROVIDER_SITE_OTHER): Payer: Self-pay | Admitting: Surgery

## 2014-05-31 ENCOUNTER — Ambulatory Visit (INDEPENDENT_AMBULATORY_CARE_PROVIDER_SITE_OTHER): Payer: BC Managed Care – PPO | Admitting: Surgery

## 2014-05-31 VITALS — BP 137/81 | HR 102 | Temp 98.1°F | Ht 62.0 in | Wt 117.0 lb

## 2014-05-31 VITALS — BP 137/81 | HR 102 | Temp 98.1°F | Ht 62.01 in | Wt 117.0 lb

## 2014-05-31 DIAGNOSIS — Z85820 Personal history of malignant melanoma of skin: Secondary | ICD-10-CM

## 2014-05-31 DIAGNOSIS — C4362 Malignant melanoma of left upper limb, including shoulder: Secondary | ICD-10-CM

## 2014-05-31 NOTE — Progress Notes (Signed)
[name]    Chief Complaint   Patient presents with   . Post-op     HISTORY OF PRESENT ILLNESS  [name] [name] is 58 y.o. female who noted a growing mole which became like "blister" over the past 5 months. Also noted bleeding from the lesion. Saw her dermatologist Dr. Christella Noa who recommended excisional biopsy which was performed by Dr. Webb Silversmith on 04/06/14. Pathology showed 1.47 mm thick, non-ulcerated melanoma with 2 mitoses/mm2. Peripheral margin was close with melanoma in situ. Preop CXR and LDH were normal. She underwent wide local excision and sentinel lymph node biopsy on 05/19/14 which showed no residual melanoma at primary site and two negative left supraclavicular lymph nodes.  Surgical sites are healing well.   Patient denies any rash/itching, fatigue, weight loss, headache, cough, shortness of breath, nausea, vomiting, diarrhea or abdominal pain.       Current Outpatient Prescriptions   Medication Sig Dispense Refill   . atorvastatin (LIPITOR) 10 MG tablet Take 1 tablet (10 mg total) by mouth daily. 90 tablet 1   . Ascorbic Acid (VITAMIN C) 500 MG tablet Take 500 mg by mouth daily.     Marland Kitchen aspirin EC 81 MG EC tablet Take 81 mg by mouth daily.     . Cholecalciferol (VITAMIN D-3) 5000 UNITS TABS Take by mouth.     . loratadine (CLARITIN) 10 MG tablet Take 10 mg by mouth daily.     . Omega-3 Fatty Acids (FISH OIL) 1200 MG CAPS Take 1,200 mg by mouth 2 (two) times daily.     Marland Kitchen oxyCODONE (ROXICODONE) 5 MG immediate release tablet Take 1 tablet (5 mg total) by mouth every 4 (four) hours as needed for Pain. 20 tablet 0   . PREMARIN 0.3 MG tablet Take 0.3 mg by mouth daily.      . progesterone (PROMETRIUM) 200 MG capsule Take 200 mg by mouth nightly.      . vitamin B-12 (CYANOCOBALAMIN) 1000 MCG tablet Take 1,000 mcg by mouth once a week.       No current facility-administered medications for this visit.     Allergies   Allergen Reactions   . Ciprofloxacin Swelling     Past Medical  History   Diagnosis Date   . Seasonal allergies    . Hyperlipidemia    . Malignant neoplasm of skin      Melanoma on left shoulder back     Past Surgical History   Procedure Laterality Date   . Lasik  2003   . Excision, melanoma Bilateral 05/19/2014     Procedure: EXCISION, MELANOMA;  Surgeon: Lorenza Chick, MD;  Location: [facility] OR;  Service: General;  Laterality: Bilateral;  LEFT SHOULDER MELANOMA WIDE LOCAL EXCISION, RIGHT MID-BACK ATYPICAL NEVUS EXCISION     . Biopsy, sentinel node N/A 05/19/2014     Procedure: BIOPSY, SENTINEL NODE;  Surgeon: Lorenza Chick, MD;  Location: [facility] OR;  Service: General;  Laterality: N/A;   SENTINEL LYMPH NODE BIOSPY W/ NUC MED @ 8:00AM,      Family History   Problem Relation Age of Onset   . Breast cancer Maternal Grandmother    . Cancer Maternal Grandmother      breast   . Hypertension Father    . Hyperlipidemia Father    . Aneurysm Father    . Heart disease Father      unsure of age   . Thyroid disease Sister    . Asthma Brother    .  Hyperlipidemia Brother    . ADD / ADHD Daughter    . Asthma Daughter    . Alcohol abuse Maternal Uncle    . Alcohol abuse Maternal Grandfather      History     Social History   . Marital Status: Single     Spouse Name: N/A     Number of Children: N/A   . Years of Education: N/A     Occupational History   . Not on file.     Social History Main Topics   . Smoking status: Former Smoker -- 1.00 packs/day for 35 years     Types: Cigarettes     Quit date: 10/29/2002   . Smokeless tobacco: Never Used   . Alcohol Use: 0.0 oz/week      Comment: drinks wine nightly (2 glasses); very rare other alcohol   . Drug Use: No   . Sexual Activity:     Partners: Male     Birth Control/ Protection: None     Other Topics Concern   . Not on file     Social History Narrative       REVIEW OF SYSTEMS  All other systems were reviewed and are negative except as previously noted in the HPI.       Objective:     Filed Vitals:    05/31/14 1539   BP: 137/81   Pulse:  102   Temp: 98.1 F (36.7 C)   SpO2: 98%     ECOG PS: 0  General: Vitals Reviewed. NAD  Eyes: Anicteric, normal conjunctiva  ENT: No sores in mouth  CV: RRR  RESP: CTA B  GI: NT/ND, no HSM  Neuro: AAO x 3  MS: no swelling in arms or legs  SKIN: healing 8 cm wide local excision scar in left shoulder without swelling, bleeding or tenderness. No evidence of satellite or in-transit lesion.      LYMPH: negative cervical suplarclavicular axillary inguinal. Healing left supraclavicular scar    Pathology reports   Date 04/06/14  Accession Number [id number])  Location Lt Post Shoulder  Dx MM   Clark Level IV   Thickness 1.18mm   Mitotic Rate 2 per mm   Ulceration Absent   Regression Absent       Assessment & Plan:   1. Melanoma: Stage 1B Left posterior shoulder   I discussed with patient about diagnosis, stage and further recommendation for his melanoma. Patient understands that the melanoma is a type of skin cancer which can be life-threatening if it spreads to distant organs. Patient's pathologic staging is 1B. Per patient's request, I provided 5-year estimated survival of 90% per AJCC staging 7th edition. Since patient has excellent prognosis, there is no adjuvant therapy recommended. I discussed small possibility of melanoma recurrence - local, nodal, distant - and reviewed symptoms or signs potentially related to melanoma recurrence.   I recommend monthly self-exam of melanoma excision scar and SLN site. I recommend every 3months visit for first two years then every 6months for the next 3years. I will obtain CXR, LDH during every other visit.   She prefers seeing Dr.Venna for skin exam. She will return to clinic in 3 months to see me and Dr.Venna.    Tiney Rouge, MD   Director, Melanoma and Cutaneous Oncology Therapeutics and Research  [facility]  P [phone]

## 2014-05-31 NOTE — Progress Notes (Signed)
[name] underwent a wide local excision of a back melanoma as well as a sentinel lymph node biopsy in the left supraclavicular region on December 11.  I spoke with her by phone 2 days ago and told her that the final pathology report demonstrates no residual melanoma, and at the sentinel lymph nodes were negative.  She has no complaints about her postop recovery, although she did say that the Neurontin made her feel "weird".    Physical exam: Both incisions are healing well with no evidence of infection.  The sutures on the back incision were removed, and Mastisol and Steri-Strips applied.    Impression: Recovering nicely from wide local excision and sentinel lymph node biopsy for Stage IB (T2aN0) melanoma of the back.    Plan: She will be speaking with Dr. Crosby Oyster about followup strategies. No need for further surgery at this time.

## 2014-06-14 ENCOUNTER — Encounter (INDEPENDENT_AMBULATORY_CARE_PROVIDER_SITE_OTHER): Payer: Self-pay | Admitting: Family Medicine

## 2014-10-04 ENCOUNTER — Encounter (INDEPENDENT_AMBULATORY_CARE_PROVIDER_SITE_OTHER): Payer: Self-pay | Admitting: Hematology & Oncology

## 2014-10-04 ENCOUNTER — Ambulatory Visit (INDEPENDENT_AMBULATORY_CARE_PROVIDER_SITE_OTHER): Payer: BC Managed Care – PPO | Admitting: Hematology & Oncology

## 2014-10-04 ENCOUNTER — Ambulatory Visit (INDEPENDENT_AMBULATORY_CARE_PROVIDER_SITE_OTHER): Payer: BC Managed Care – PPO | Admitting: Surgery

## 2014-10-04 ENCOUNTER — Ambulatory Visit (INDEPENDENT_AMBULATORY_CARE_PROVIDER_SITE_OTHER): Payer: BC Managed Care – PPO | Admitting: Dermatology

## 2014-10-04 VITALS — BP 148/76 | HR 93 | Temp 98.7°F | Ht 62.0 in | Wt 123.6 lb

## 2014-10-04 DIAGNOSIS — D170 Benign lipomatous neoplasm of skin and subcutaneous tissue of head, face and neck: Secondary | ICD-10-CM

## 2014-10-04 DIAGNOSIS — C4362 Malignant melanoma of left upper limb, including shoulder: Secondary | ICD-10-CM

## 2014-10-04 NOTE — Progress Notes (Signed)
[name]    Chief Complaint   Patient presents with   . 3 month follow up     HISTORY OF PRESENT ILLNESS  [name] [name] is 59 y.o. female who noted a growing mole in left shoulder which became like "blister" over 5 months. Also noted bleeding from the lesion. Saw her dermatologist Dr. Christella Noa who recommended excisional biopsy which was performed by Dr. Webb Silversmith on 04/06/14. Pathology showed 1.47 mm thick, non-ulcerated [name] with 2 mitoses/mm2. Peripheral margin was close with [name] in situ. Preop CXR and LDH were normal. She underwent wide local excision and sentinel lymph node biopsy on 05/19/14 which showed no residual [name] at primary site and two negative left supraclavicular lymph nodes.    She's here for surveillance. Surgical sites are healed well. No palpable lymph node. Patient denies any rash/itching, fatigue, weight loss, headache, cough, shortness of breath, nausea, vomiting, diarrhea or abdominal pain.       Current Outpatient Prescriptions   Medication Sig Dispense Refill   . Ascorbic Acid (VITAMIN C) 500 MG tablet Take 500 mg by mouth daily.     Marland Kitchen aspirin EC 81 MG EC tablet Take 81 mg by mouth daily.     Marland Kitchen atorvastatin (LIPITOR) 10 MG tablet Take 1 tablet (10 mg total) by mouth daily. 90 tablet 1   . Cholecalciferol (VITAMIN D-3) 5000 UNITS TABS Take by mouth.     . loratadine (CLARITIN) 10 MG tablet Take 10 mg by mouth daily.     . Omega-3 Fatty Acids (FISH OIL) 1200 MG CAPS Take 1,200 mg by mouth 2 (two) times daily.     Marland Kitchen oxyCODONE (ROXICODONE) 5 MG immediate release tablet Take 1 tablet (5 mg total) by mouth every 4 (four) hours as needed for Pain. 20 tablet 0   . vitamin B-12 (CYANOCOBALAMIN) 1000 MCG tablet Take 1,000 mcg by mouth once a week.     Marland Kitchen PREMARIN 0.3 MG tablet Take 0.3 mg by mouth daily.      . progesterone (PROMETRIUM) 200 MG capsule Take 200 mg by mouth nightly.        No current facility-administered medications for this visit.      Allergies   Allergen Reactions   . Ciprofloxacin Swelling     Past Medical History   Diagnosis Date   . Seasonal allergies    . Hyperlipidemia    . Malignant neoplasm of skin      [name] on left shoulder back     Past Surgical History   Procedure Laterality Date   . Lasik  2003   . Excision, [name] Bilateral 05/19/2014     Procedure: EXCISION, [name];  Surgeon: Lorenza Chick, MD;  Location: [facility] OR;  Service: General;  Laterality: Bilateral;  LEFT SHOULDER [name] WIDE LOCAL EXCISION, RIGHT MID-BACK ATYPICAL NEVUS EXCISION     . Biopsy, sentinel node N/A 05/19/2014     Procedure: BIOPSY, SENTINEL NODE;  Surgeon: Lorenza Chick, MD;  Location: [facility] OR;  Service: General;  Laterality: N/A;   SENTINEL LYMPH NODE BIOSPY W/ NUC MED @ 8:00AM,      Family History   Problem Relation Age of Onset   . Breast cancer Maternal Grandmother    . Cancer Maternal Grandmother      breast   . Hypertension Father    . Hyperlipidemia Father    . Aneurysm Father    . Heart disease Father      unsure of age   .  Thyroid disease Sister    . Asthma Brother    . Hyperlipidemia Brother    . ADD / ADHD Daughter    . Asthma Daughter    . Alcohol abuse Maternal Uncle    . Alcohol abuse Maternal Grandfather      History     Social History   . Marital Status: Single     Spouse Name: N/A   . Number of Children: N/A   . Years of Education: N/A     Occupational History   . Not on file.     Social History Main Topics   . Smoking status: Former Smoker -- 1.00 packs/day for 35 years     Types: Cigarettes     Quit date: 10/29/2002   . Smokeless tobacco: Never Used   . Alcohol Use: 0.0 oz/week      Comment: drinks wine nightly (2 glasses); very rare other alcohol   . Drug Use: No   . Sexual Activity:     Partners: Male     Birth Control/ Protection: None     Other Topics Concern   . Not on file     Social History Narrative       REVIEW OF SYSTEMS  All other systems were reviewed and are negative except as previously noted in the  HPI.       Objective:     Filed Vitals:    10/04/14 1442   BP: 148/76   Pulse: 93   Temp: 98.7 F (37.1 C)   SpO2: 96%     ECOG PS: 0  General: Vitals Reviewed. NAD  Eyes: Anicteric, normal conjunctiva  ENT: No sores in mouth  CV: RRR  RESP: CTA B  GI: NT/ND, no HSM  Neuro: AAO x 3  MS: no swelling in arms or legs  SKIN: 8 cm wide local excision scar in left shoulder without swelling, bleeding or tenderness. No evidence of satellite or in-transit lesion.      LYMPH: negative cervical suplarclavicular axillary inguinal.     Pathology reports   Date 04/06/14  Accession Number [id number])  Location Lt Post Shoulder  Dx MM   Clark Level IV   Thickness 1.13mm   Mitotic Rate 2 per mm   Ulceration Absent   Regression Absent       Assessment & Plan:   1. [name]: Stage 1B Left posterior shoulder   No clinical evidence of recurrence.   Return to clinic in 3 months. CXR, LDH at next visit.    Tiney Rouge, MD   Medical Oncology  Director, [name] and Cutaneous Oncology Therapeutics and Research  [facility]  P [phone]

## 2014-10-04 NOTE — Progress Notes (Signed)
[facility]  [facility]     Chief Complaint   Patient presents with   . history of mealnoma       HISTORY OF PRESENT ILLNESS  [name] is 59 y.o. female with a history of Stage 1B Left posterior shoulder.Patient noted a growing mole which became like a "pinkish/purpleish blister"  over the past 5 months, + bleeding. Saw her dermatologist Dr. Christella Noa who recommended excisional biopsy which was performed by Dr. Webb Silversmith on 04/06/14. Pathology showed 1.47 mm thick, non-ulcerated melanoma with 2 mitoses/mm2. Peripheral margin was close with melanoma in situ. She underwent wide local excision and sentinel lymph node biopsy on 05/19/14 which showed no residual melanoma at primary site and two negative left supraclavicular lymph nodes. Pre op CXR and LDH were normal.    No personal/family history of skin cancer. No tanning bed use. No blistering sunburns.    Presents today for TBSE. Denies any other new, changing, bleeding, or itching skin lesions. Denies tender or palpable lymph nodes.       Current Outpatient Prescriptions   Medication Sig Dispense Refill   . Ascorbic Acid (VITAMIN C) 500 MG tablet Take 500 mg by mouth daily.     Marland Kitchen aspirin EC 81 MG EC tablet Take 81 mg by mouth daily.     Marland Kitchen atorvastatin (LIPITOR) 10 MG tablet Take 1 tablet (10 mg total) by mouth daily. 90 tablet 1   . Cholecalciferol (VITAMIN D-3) 5000 UNITS TABS Take by mouth.     . loratadine (CLARITIN) 10 MG tablet Take 10 mg by mouth daily.     . Omega-3 Fatty Acids (FISH OIL) 1200 MG CAPS Take 1,200 mg by mouth 2 (two) times daily.     Marland Kitchen oxyCODONE (ROXICODONE) 5 MG immediate release tablet Take 1 tablet (5 mg total) by mouth every 4 (four) hours as needed for Pain. 20 tablet 0   . PREMARIN 0.3 MG tablet Take 0.3 mg by mouth daily.      . progesterone (PROMETRIUM) 200 MG capsule Take 200 mg by mouth nightly.      . vitamin B-12 (CYANOCOBALAMIN) 1000 MCG tablet Take 1,000 mcg by mouth  once a week.       No current facility-administered medications for this visit.     Allergies   Allergen Reactions   . Ciprofloxacin Swelling     Past Medical History   Diagnosis Date   . Seasonal allergies    . Hyperlipidemia    . Malignant neoplasm of skin      Melanoma on left shoulder back     Past Surgical History   Procedure Laterality Date   . Lasik  2003   . Excision, melanoma Bilateral 05/19/2014     Procedure: EXCISION, MELANOMA;  Surgeon: Lorenza Chick, MD;  Location: [facility] OR;  Service: General;  Laterality: Bilateral;  LEFT SHOULDER MELANOMA WIDE LOCAL EXCISION, RIGHT MID-BACK ATYPICAL NEVUS EXCISION     . Biopsy, sentinel node N/A 05/19/2014     Procedure: BIOPSY, SENTINEL NODE;  Surgeon: Lorenza Chick, MD;  Location: [facility] OR;  Service: General;  Laterality: N/A;   SENTINEL LYMPH NODE BIOSPY W/ NUC MED @ 8:00AM,      Family History   Problem Relation Age of Onset   . Breast cancer Maternal Grandmother    . Cancer Maternal Grandmother      breast   . Hypertension Father    . Hyperlipidemia Father    .  Aneurysm Father    . Heart disease Father      unsure of age   . Thyroid disease Sister    . Asthma Brother    . Hyperlipidemia Brother    . ADD / ADHD Daughter    . Asthma Daughter    . Alcohol abuse Maternal Uncle    . Alcohol abuse Maternal Grandfather      History     Social History   . Marital Status: Single     Spouse Name: N/A   . Number of Children: N/A   . Years of Education: N/A     Occupational History   . Not on file.     Social History Main Topics   . Smoking status: Former Smoker -- 1.00 packs/day for 35 years     Types: Cigarettes     Quit date: 10/29/2002   . Smokeless tobacco: Never Used   . Alcohol Use: 0.0 oz/week      Comment: drinks wine nightly (2 glasses); very rare other alcohol   . Drug Use: No   . Sexual Activity:     Partners: Male     Birth Control/ Protection: None     Other Topics Concern   . Not on file     Social History Narrative       REVIEW OF  SYSTEMS  Const - no fever no chills  CV - no chest pain  Resp- no cough  Neuro - no headache  Abd - no pain  Integ - no lesions of concern  Psych - no depression or anxiety  All other systems reviewed and negative    Objective:     There were no vitals filed for this visit.   Constitutional: General appearance: NAD, conversant    HEENT: normocephalic, atraumatic. Conjunctiva, lids, lips, teeth, gums and oropharynx examined with no suspicious lesions    SKIN:A total body skin examination is completed including:   - palpation of scalp and inspection of hair of scalp, eyebrows, face, chest and extremities   - inspection of eccrine and apocrine glands.   - inspection and/or palpation of the skin and subcutaneous tissues with the following anatomic skin sites examined with the following findings:   1. Head including face - no lesions of concern  2. Neck  - no lesions of concern  3. Chest including breasts, and axillae - no lesions of concern  4. Abdomen - no lesions of concern  5. Genitalia, groin, buttocks - no lesions of concern  6. Back -   a. A. upper mid back 6 cm soft non tender SQ nodule  (Referred for excision with Dr. Willeen Cass)  7. Right upper extremity - no lesions of concern  8. Left upper extremity -   a. posterior shoulder 6 cm well healed scar (MM site 10/15) no pigmentation or nodulatrity  9. Right lower extremity - no lesions of concern  10. Left lower extremity - no lesions of concern     - Dermatoscopy was used to evaluate the nevi.   - General skin: multiple brown macules, size 2-49mm, approximately 50 in number, distributed primarily on the trunk. Extensive photodamage and rhytids    LYMPHATIC:Examination of lymph node basins is completed, including the H and N, axilla and groin. There is no lymphadenopathy.    NECK: supple, no masses    CVS: no swelling, varicosities or edema    EXTREMITIES: inspection and palpation of digits and nails revealed no abnormalities  GI:  External anus without suspicious  lesions    NEURO/PSYCH: alert and oriented, pleasant      Radiologic Studies None    Pathology reports   Date 04/06/14  Accession Number [id number])  Location Lt Post Shoulder  Dx MM   Clark Level IV   Thickness 1.56mm   Mitotic Rate 2 per mm   Ulceration Absent   Regression Absent       Assessment & Plan:   59 y.o. female with a history of Stage 1B Left posterior shoulder 04/06/14 s/p WLE and negative SLN     1. Melanoma: Stage 1B Left posterior shoulder 03/2014  - no evidence of recurrence    2. Lipoma upper mid back  - Referred for excision with Dr. Willeen Cass    3. TBSE  -Sun protective measure reviewed, discussed risk factors for skin cancer and how to avoid excessive sun exposure. Reviewed use of sunscreens with appropriate broad spectrum coverage, sun protective clothing and other measures to avoid further damage.   -ABCDE's of melanoma reviewed  -Monthly self skin exam was advised and the patient was instructed to return to the clinic prior to scheduled visit if any worrisome findings are discovered.    -Next TBSE in 3 months, concurrent visit with Dr. Crosby Oyster (Recommend TBSE with Dr. Christella Noa, patient prefers to have her skin checks here)      Tonye Becket, FNP-BC  Nurse Practitioner, [facility]    Suraj S. Ruel Favors, MD, FAAD  Medical Director, [facility]  [facility]  [phone] (t)   [phone] (f)     I personally reviewed the patients history, pathology when relevant, and completed an independent skin examination. I agree with all components of this written note. I have made addendums where/if needed. S. VENNA.

## 2014-10-05 NOTE — Progress Notes (Signed)
Dr. Ruel Favors asked me to see [name] [name] because of a mass at the base of her posterior neck.  This has been present for a number of years, is not uncomfortable, but it does bother her cosmetically.  She does not think it is enlarged, particularly.    Physical exam: At the base of the posterior neck is an approximately 4 x 4 centimeters smooth subcutaneous mass, typical of a lipoma.    Impression: Lipoma.    Plan: I explained the benign nature of these lesions.  Observation is certainly reasonable, although in her case, she would like to have this removed.  Given its size and location, I believe this could be done most comfortably and safely as an outpatient procedure using local anesthesia and intravenous sedation.  Complications are infrequent, but would include bleeding, infection, poor wound healing or recurrence.  I would note that recurrences of lipomas are quite rare.  The specimen would be sent to pathology for analysis and I would see her in follow-up to inspect the incision, and discuss the final report.  She would like to be scheduled.

## 2014-11-16 ENCOUNTER — Ambulatory Visit: Payer: BC Managed Care – PPO

## 2014-11-16 NOTE — Pre-Procedure Instructions (Addendum)
Interview RN note for day of surgery 11/24/14:   Connected patient with surgical scheduler Lorenda of Dr. Willeen Cass via conference call in regard to time discrepancy.   Currently, patient will arrive 0600 for 0730 surgery; patient stated she might have a post-op ride problem.   Patient will call Lorenda tomorrow to confirm whether she can come on 11/24/14 or to postpone.   No testing needed per anesthesia guidelines and none requested by surgeon per patient report.   Patient has scheduled for routine physical at [facility] PCP on 11/22/14 at 1030.

## 2014-11-22 ENCOUNTER — Encounter (INDEPENDENT_AMBULATORY_CARE_PROVIDER_SITE_OTHER): Payer: Self-pay | Admitting: Family Medicine

## 2014-11-22 ENCOUNTER — Ambulatory Visit (FREE_STANDING_LABORATORY_FACILITY): Payer: BC Managed Care – PPO | Admitting: Family Medicine

## 2014-11-22 VITALS — BP 145/78 | HR 93 | Temp 98.4°F | Resp 16 | Ht 62.0 in | Wt 124.8 lb

## 2014-11-22 DIAGNOSIS — E781 Pure hyperglyceridemia: Secondary | ICD-10-CM

## 2014-11-22 DIAGNOSIS — E039 Hypothyroidism, unspecified: Secondary | ICD-10-CM

## 2014-11-22 DIAGNOSIS — R030 Elevated blood-pressure reading, without diagnosis of hypertension: Secondary | ICD-10-CM

## 2014-11-22 DIAGNOSIS — E785 Hyperlipidemia, unspecified: Secondary | ICD-10-CM

## 2014-11-22 DIAGNOSIS — D7589 Other specified diseases of blood and blood-forming organs: Secondary | ICD-10-CM

## 2014-11-22 DIAGNOSIS — E538 Deficiency of other specified B group vitamins: Secondary | ICD-10-CM

## 2014-11-22 DIAGNOSIS — E038 Other specified hypothyroidism: Secondary | ICD-10-CM

## 2014-11-22 LAB — CBC AND DIFFERENTIAL
Basophils Absolute Automated: 0.03 10*3/uL (ref 0.00–0.20)
Basophils Automated: 0 %
Eosinophils Absolute Automated: 0.05 10*3/uL (ref 0.00–0.70)
Eosinophils Automated: 1 %
Hematocrit: 40.1 % (ref 37.0–47.0)
Hgb: 12.8 g/dL (ref 12.0–16.0)
Immature Granulocytes Absolute: 0.02 10*3/uL
Immature Granulocytes: 0 %
Lymphocytes Absolute Automated: 1.3 10*3/uL (ref 0.50–4.40)
Lymphocytes Automated: 20 %
MCH: 33.9 pg — ABNORMAL HIGH (ref 28.0–32.0)
MCHC: 31.9 g/dL — ABNORMAL LOW (ref 32.0–36.0)
MCV: 106.1 fL — ABNORMAL HIGH (ref 80.0–100.0)
MPV: 10.4 fL (ref 9.4–12.3)
Monocytes Absolute Automated: 0.37 10*3/uL (ref 0.00–1.20)
Monocytes: 6 %
Neutrophils Absolute: 4.73 10*3/uL (ref 1.80–8.10)
Neutrophils: 73 %
Nucleated RBC: 0 /100 WBC (ref 0–1)
Platelets: 197 10*3/uL (ref 140–400)
RBC: 3.78 10*6/uL — ABNORMAL LOW (ref 4.20–5.40)
RDW: 13 % (ref 12–15)
WBC: 6.5 10*3/uL (ref 3.50–10.80)

## 2014-11-22 LAB — COMPREHENSIVE METABOLIC PANEL
ALT: 30 U/L (ref 0–55)
AST (SGOT): 28 U/L (ref 5–34)
Albumin/Globulin Ratio: 1.3 (ref 0.9–2.2)
Albumin: 3.6 g/dL (ref 3.5–5.0)
Alkaline Phosphatase: 73 U/L (ref 37–106)
BUN: 9 mg/dL (ref 7.0–19.0)
Bilirubin, Total: 0.9 mg/dL (ref 0.1–1.2)
CO2: 27 mEq/L (ref 21–30)
Calcium: 9.4 mg/dL (ref 8.5–10.5)
Chloride: 104 mEq/L (ref 100–111)
Creatinine: 0.7 mg/dL (ref 0.4–1.5)
Globulin: 2.8 g/dL (ref 2.0–3.7)
Glucose: 107 mg/dL — ABNORMAL HIGH (ref 70–100)
Potassium: 4.2 mEq/L (ref 3.5–5.3)
Protein, Total: 6.4 g/dL (ref 6.0–8.3)
Sodium: 142 mEq/L (ref 135–146)

## 2014-11-22 LAB — LIPID PANEL
Cholesterol / HDL Ratio: 3.6
Cholesterol: 276 mg/dL — ABNORMAL HIGH (ref 0–199)
HDL: 76 mg/dL (ref >40)
Triglycerides: 423 mg/dL — ABNORMAL HIGH (ref 34–149)

## 2014-11-22 LAB — HEMOLYSIS INDEX: Hemolysis Index: 10 (ref 0–18)

## 2014-11-22 LAB — HEMOGLOBIN A1C: Hemoglobin A1C: 5.3 % (ref 0.0–6.0)

## 2014-11-22 LAB — VITAMIN B12: Vitamin B-12: 178 pg/mL — ABNORMAL LOW (ref 211–911)

## 2014-11-22 LAB — T4, FREE: T4 Free: 0.71 ng/dL (ref 0.70–1.48)

## 2014-11-22 LAB — TSH: TSH: 4.49 u[IU]/mL (ref 0.35–4.94)

## 2014-11-22 LAB — GFR: EGFR: >60

## 2014-11-22 MED ORDER — ATORVASTATIN CALCIUM 10 MG PO TABS
10.0000 mg | ORAL_TABLET | Freq: Every day | ORAL | Status: DC
Start: 2014-11-22 — End: 2015-05-15

## 2014-11-22 NOTE — Progress Notes (Signed)
1. Have you self referred yourself since we last saw you?    Refer to care team   Or  Add specialists:    NO

## 2014-11-22 NOTE — Progress Notes (Signed)
Subjective:       Patient ID: [name] [name] is a 59 y.o. female.    Chief Complaint   Patient presents with   . Hyperlipidemia   . Hypertension   . Thyroid Problem   . Alopecia       HPI    Hx of alcohol use disorder - still drinking 3 glasses of wine per day  Hx of 35 year smoking, quit 2004.  Denies breathing problem, cough, SOB  Getting orders/pap from gyn today Dr. Merdis Delay.     HTN- not checking at home. Has a machine.   Sees a Systems analyst on regular basis. Medical record shows trend of elevated BP.  Discussed this with patient.     Elevated TGs- most likely due to ETOH; LDL is low.    C/o hair loss, general, no scalp issue.    Hx subclinical hypothyroidism - last tsh 3.63    Lipoma on back - has plan for removal upcoming.    Going to see her gyn today    Patient Care Team:  Jules Husbands, DO as PCP - General (Family Medicine)  Cutting, Corrie Dandy, MD as Consulting Physician (Obstetrics and Gynecology)  Marrion Coy Roseanna Rainbow, MD as Consulting Physician (Ophthalmology)  Dr. Marijo Sanes (Gastroenterology)  Quentin Angst, MD as Consulting Physician (Dermatology)    Patient Active Problem List    Diagnosis Date Noted   . Lipoma 11/24/2014   . Skin lesion    . 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 Low vitamin B12 level 03/15/2014   . Alcohol use disorder 03/12/2014   . Macrocytosis 03/12/2014   . Subclinical hypothyroidism 12/14/2012   . Elevated blood pressure reading without diagnosis of hypertension 12/14/2012   . Plantar fasciitis, bilateral 12/14/2012   . Hypertriglyceridemia 10/28/2012   . Hyperlipidemia with target LDL less than 100 10/28/2012   . Allergy 10/28/2012     Dust and cats  Receiving immunotherapy       History     Social History   . Marital Status: Single     Spouse Name: N/A   . Number of Children: N/A   . Years of Education: N/A     Social History Main Topics   . Smoking status: Former Smoker -- 1.00 packs/day for 35 years     Types: Cigarettes     Quit date: 10/29/2002   .  Smokeless tobacco: Never Used   . Alcohol Use: 0.0 oz/week      Comment: drinks wine nightly (2 glasses); very rare other alcohol   . Drug Use: No   . Sexual Activity:     Partners: Male     Birth Control/ Protection: None     Other Topics Concern   . None     Social History Narrative       Current Outpatient Prescriptions   Medication Sig Dispense Refill   . ACETAMINOPHEN ER PO Take by mouth as needed.     . Ascorbic Acid (VITAMIN C) 500 MG tablet Take 500 mg by mouth daily.     Marland Kitchen aspirin EC 81 MG EC tablet Take 81 mg by mouth daily.     Marland Kitchen atorvastatin (LIPITOR) 10 MG tablet Take 1 tablet (10 mg total) by mouth daily. 90 tablet 1   . Cholecalciferol (VITAMIN D-3) 5000 UNITS TABS Take by mouth.     . loratadine (CLARITIN) 10 MG tablet Take 10 mg by mouth daily.     . Omega-3  Fatty Acids (FISH OIL) 1200 MG CAPS Take 1,200 mg by mouth 2 (two) times daily.     . vitamin B-12 (CYANOCOBALAMIN) 1000 MCG tablet Take 1,000 mcg by mouth once a week.     . gabapentin (NEURONTIN) 300 MG capsule Take 1 capsule (300 mg total) by mouth 3 (three) times daily. 9 capsule 0   . oxyCODONE (ROXICODONE) 5 MG immediate release tablet Take 1 tablet (5 mg total) by mouth every 4 (four) hours as needed for Pain. 10 tablet 0     No current facility-administered medications for this visit.   did not take vitamin b12 previously recommended.  Usually takes fish oil vit D but stopped for upcoming surgery.       The following portions of the patient's history were reviewed and updated as appropriate: allergies, current medications, past family history, past medical history, past social history, past surgical history and problem list.    Review of Systems   Cardiovascular: Negative for palpitations and leg swelling.   Psychiatric/Behavioral: Negative for dysphoric mood and agitation. The patient is not nervous/anxious.            Objective:    Physical Exam  BP 145/78 mmHg  Pulse 93  Temp(Src) 98.4 F (36.9 C) (Oral)  Resp 16  Ht 1.575 m (5'  2")  Wt 56.609 kg (124 lb 12.8 oz)  BMI 22.82 kg/m2  Wt Readings from Last 3 Encounters:   11/24/14 56.6 kg (124 lb 12.5 oz)   11/22/14 56.609 kg (124 lb 12.8 oz)   10/04/14 56.065 kg (123 lb 9.6 oz)     Component      Latest Ref Rng 11/19/2012 11/26/2012 08/30/2013 03/09/2014   TSH      0.35 - 4.94 uIU/mL 4.98 (H)  6.670 (H) 3.63   T4 Free      0.70 - 1.48 ng/dL 0.9   5.28     Component      Latest Ref Rng 11/19/2012 08/30/2013 03/09/2014   Cholesterol      0 - 199 mg/dL 413 244 010   Triglycerides      34 - 149 mg/dL 272 (H) 536 (H) 644 (H)   HDL      >40 mg/dL 73 67 60   Calculated LDL      0 - 99 mg/dL 17 26 4    CHOL/HDL Ratio      See Below 1.7  2.3   Non HDL Chol. (LDL+VLDL)       53     VLDL Cholesterol Cal      10 - 40 mg/dL  35 75 (H)         Assessment:       1. Hypertriglyceridemia    2. Hyperlipidemia with target LDL less than 100    3. Subclinical hypothyroidism    4. Elevated blood pressure reading without diagnosis of hypertension    5. Macrocytosis    6. Low vitamin B12 level            Plan:      Procedures        Hypertriglyceridemia  Reduce ETOH    Hyperlipidemia with target LDL less than 100  Continue current rx    Subclinical hypothyroidism  Check labs    Elevated blood pressure reading without diagnosis of hypertension  -   MA/LPN to  re-measure blood pressure  Need to start checking BP at home AM and PM  Discussed likely need to start antihypertensive,  will let patient check bp at home twice daily for two weeks for review first. Pt to submit log for review in 2 weeks.    Macrocytosis  Low vitamin B12 level  Excess ETOH consumption. Counseling of reduction. Consider referral to [facility]      Orders:  -     CBC and differential  -     Comprehensive metabolic panel  -     Lipid panel  -     Hemoglobin A1C  -     Vitamin B12  -     Urinalysis  -     TSH  -     T4, free  -     Thyroid peroxidase antibody

## 2014-11-23 LAB — URINALYSIS
Bilirubin, UA: NEGATIVE
Blood, UA: NEGATIVE
Glucose, UA: NEGATIVE
Ketones UA: NEGATIVE
Leukocyte Esterase, UA: NEGATIVE
Nitrite, UA: NEGATIVE
Protein, UR: NEGATIVE
Specific Gravity UA: 1.015 (ref 1.001–1.035)
Urine pH: 6 (ref 5.0–8.0)
Urobilinogen, UA: 0.2 (ref 0.2–2.0)

## 2014-11-23 NOTE — Anesthesia Preprocedure Evaluation (Addendum)
Anesthesia Evaluation    AIRWAY    Mallampati: I         CARDIOVASCULAR    regular       DENTAL         PULMONARY    clear to auscultation     OTHER FINDINGS                      Anesthesia Plan    ASA 2     general                     intravenous induction   Detailed anesthesia plan: general IV        Post op pain management: per surgeon    informed consent obtained    Plan discussed with CRNA.      pertinent labs reviewed             ===============================================================  Inpatient Anesthesia Evaluation    Patient Name: [name] [name],[name] [name]  Surgeon: Lorenza Chick, MD  Patient Age / Sex: 59 y.o. / female    Medical History:     Past Medical History   Diagnosis Date   . Seasonal allergies    . Hyperlipidemia    . 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 Lipoma      base of posterior neck       Past Surgical History   Procedure Laterality Date   . Lasik  2003   . Excision, melanoma Bilateral 05/19/2014     Procedure: EXCISION, MELANOMA;  Surgeon: Lorenza Chick, MD;  Location: [facility] OR;  Service: General;  Laterality: Bilateral;  LEFT SHOULDER MELANOMA WIDE LOCAL EXCISION, RIGHT MID-BACK ATYPICAL NEVUS EXCISION     . Biopsy, sentinel node N/A 05/19/2014     Procedure: BIOPSY, SENTINEL NODE;  Surgeon: Lorenza Chick, MD;  Location: [facility] OR;  Service: General;  Laterality: N/A;   SENTINEL LYMPH NODE BIOSPY W/ NUC MED @ 8:00AM,          Allergies:     Allergies   Allergen Reactions   . Ciprofloxacin Swelling         Medications:     No current facility-administered medications for this encounter.              Prior to Admission medications    Medication Sig Start Date End Date Taking? Authorizing Provider   ACETAMINOPHEN ER PO Take by mouth as needed.   Yes [provider]   Ascorbic Acid (VITAMIN C) 500 MG tablet Take 500 mg by mouth daily.    [provider]   aspirin EC 81 MG EC tablet Take 81 mg by mouth daily.    [provider]   atorvastatin (LIPITOR) 10 MG tablet Take 1 tablet (10 mg total) by mouth daily. 11/22/14   Quincy Simmonds A, DO   Cholecalciferol (VITAMIN D-3) 5000 UNITS TABS Take by mouth.    [provider]   loratadine (CLARITIN) 10 MG tablet Take 10 mg by mouth daily.    [provider]   Omega-3 Fatty Acids (FISH OIL) 1200 MG CAPS Take 1,200 mg by mouth 2 (two) times daily.    [provider]   vitamin B-12 (CYANOCOBALAMIN) 1000 MCG tablet Take 1,000 mcg by mouth once a week.    [provider]     Vitals        Wt Readings  from Last 3 Encounters:   11/22/14 56.609 kg (124 lb 12.8 oz)   11/16/14 55.792 kg (123 lb)   10/04/14 56.065 kg (123 lb 9.6 oz)     BMI (Estimated body mass index is 22.49 kg/(m^2) as calculated from the following:    Height as of this encounter: 1.575 m (5\' 2" ).    Weight as of this encounter: 55.792 kg (123 lb).)  Temp Readings from Last 3 Encounters:   11/22/14 36.9 C (98.4 F) Oral   10/04/14 37.1 C (98.7 F) Oral   05/31/14 36.7 C (98.1 F)      BP Readings from Last 3 Encounters:   11/22/14 145/78   10/04/14 148/76   05/31/14 137/81     Pulse Readings from Last 3 Encounters:   11/22/14 93   10/04/14 93   05/31/14 102           Labs:   CBC:  Lab Results   Component Value Date    WBC 6.50 11/22/2014    HGB 12.8 11/22/2014    HCT 40.1 11/22/2014    PLT 197 11/22/2014       Chemistries:  Lab Results   Component Value Date    NA 142 11/22/2014    K 4.2 11/22/2014    CL 104 11/22/2014    CO2 27 11/22/2014    BUN 9.0 11/22/2014    CREAT 0.7 11/22/2014    GLU 107* 11/22/2014    HGBA1C 5.3 11/22/2014    CA 9.4 11/22/2014    AST 28 11/22/2014       Coags:  Lab Results   Component Value Date    PT 13.6 12/16/2013    PTT 24 12/16/2013    INR 1.1 12/16/2013     _____________________      Signed by: Caroleen Hamman  11/23/2014   4:52 PM    =============================================================

## 2014-11-24 ENCOUNTER — Ambulatory Visit: Payer: BC Managed Care – PPO | Admitting: Surgery

## 2014-11-24 ENCOUNTER — Ambulatory Visit: Payer: BC Managed Care – PPO | Admitting: Anesthesiology

## 2014-11-24 ENCOUNTER — Ambulatory Visit: Payer: Self-pay

## 2014-11-24 ENCOUNTER — Other Ambulatory Visit: Payer: Self-pay

## 2014-11-24 ENCOUNTER — Ambulatory Visit
Admission: RE | Admit: 2014-11-24 | Discharge: 2014-11-24 | Disposition: A | Discharge status: Home / Self Care | Payer: BC Managed Care – PPO | Source: Ambulatory Visit | Attending: Surgery | Admitting: Surgery

## 2014-11-24 ENCOUNTER — Encounter
Admission: RE | Disposition: A | Discharge status: Home / Self Care | Payer: Self-pay | Source: Ambulatory Visit | Attending: Surgery

## 2014-11-24 DIAGNOSIS — Z87891 Personal history of nicotine dependence: Secondary | ICD-10-CM | POA: Insufficient documentation

## 2014-11-24 DIAGNOSIS — Z7982 Long term (current) use of aspirin: Secondary | ICD-10-CM | POA: Insufficient documentation

## 2014-11-24 DIAGNOSIS — Z85820 Personal history of malignant melanoma of skin: Secondary | ICD-10-CM | POA: Insufficient documentation

## 2014-11-24 DIAGNOSIS — D179 Benign lipomatous neoplasm, unspecified: Secondary | ICD-10-CM | POA: Diagnosis present

## 2014-11-24 DIAGNOSIS — D171 Benign lipomatous neoplasm of skin and subcutaneous tissue of trunk: Secondary | ICD-10-CM

## 2014-11-24 DIAGNOSIS — E785 Hyperlipidemia, unspecified: Secondary | ICD-10-CM | POA: Insufficient documentation

## 2014-11-24 HISTORY — PX: EXCISION, LIPOMA: SHX3984

## 2014-11-24 HISTORY — DX: Benign lipomatous neoplasm, unspecified: D17.9

## 2014-11-24 LAB — THYROID PEROXIDASE ANTIBODY: Anti-thyroid peroxidase AB: <1 (ref <9)

## 2014-11-24 SURGERY — EXCISION, LIPOMA
Anesthesia: Anesthesia General | Wound class: Clean

## 2014-11-24 MED ORDER — NEOSTIGMINE METHYLSULFATE 1 MG/ML IJ SOLN
INTRAMUSCULAR | Status: DC | PRN
Start: 2014-11-24 — End: 2014-11-24
  Administered 2014-11-24 (×3): 1 mg via INTRAVENOUS

## 2014-11-24 MED ORDER — OXYCODONE HCL 5 MG PO TABS
5.0000 mg | ORAL_TABLET | ORAL | Status: DC | PRN
Start: 2014-11-24 — End: 2015-03-21

## 2014-11-24 MED ORDER — ACETAMINOPHEN-CODEINE #3 300-30 MG PO TABS
1.0000 | ORAL_TABLET | Freq: Once | ORAL | Status: AC | PRN
Start: 2014-11-24 — End: 2014-11-24

## 2014-11-24 MED ORDER — LIDOCAINE-EPINEPHRINE 1 %-1:100000 IJ SOLN
INTRAMUSCULAR | Status: DC | PRN
Start: 2014-11-24 — End: 2014-11-24
  Administered 2014-11-24: 2 mL

## 2014-11-24 MED ORDER — SODIUM CHLORIDE 0.9 % IV MBP
1.0000 g | Freq: Once | INTRAVENOUS | Status: DC
Start: 2014-11-24 — End: 2014-11-24

## 2014-11-24 MED ORDER — FENTANYL CITRATE (PF) 50 MCG/ML IJ SOLN (WRAP)
INTRAMUSCULAR | Status: AC
Start: 2014-11-24 — End: ?
  Filled 2014-11-24: qty 2

## 2014-11-24 MED ORDER — PROPOFOL 10 MG/ML IV EMUL (WRAP)
INTRAVENOUS | Status: AC
Start: 2014-11-24 — End: ?
  Filled 2014-11-24: qty 50

## 2014-11-24 MED ORDER — ACETAMINOPHEN 500 MG PO TABS
1000.0000 mg | ORAL_TABLET | Freq: Once | ORAL | Status: AC
Start: 2014-11-24 — End: 2014-11-24
  Administered 2014-11-24: 1000 mg via ORAL

## 2014-11-24 MED ORDER — ROCURONIUM BROMIDE 50 MG/5ML IV SOLN
INTRAVENOUS | Status: DC | PRN
Start: 2014-11-24 — End: 2014-11-24
  Administered 2014-11-24: 35 mg via INTRAVENOUS

## 2014-11-24 MED ORDER — MEPERIDINE HCL 25 MG/ML IJ SOLN
25.0000 mg | INTRAMUSCULAR | Status: DC | PRN
Start: 2014-11-24 — End: 2014-11-24

## 2014-11-24 MED ORDER — CEFAZOLIN 1 GM MBP (CNR)
Status: AC
Start: 2014-11-24 — End: ?
  Filled 2014-11-24: qty 100

## 2014-11-24 MED ORDER — GABAPENTIN 300 MG PO CAPS
300.0000 mg | ORAL_CAPSULE | Freq: Three times a day (TID) | ORAL | Status: DC
Start: 2014-11-24 — End: 2014-11-24

## 2014-11-24 MED ORDER — PROPOFOL INFUSION 10 MG/ML
INTRAVENOUS | Status: DC | PRN
Start: 2014-11-24 — End: 2014-11-24
  Administered 2014-11-24: 200 mg via INTRAVENOUS

## 2014-11-24 MED ORDER — GABAPENTIN 300 MG PO CAPS
ORAL_CAPSULE | ORAL | Status: AC
Start: 2014-11-24 — End: ?
  Filled 2014-11-24: qty 1

## 2014-11-24 MED ORDER — DIPHENHYDRAMINE HCL 50 MG/ML IJ SOLN
6.2500 mg | Freq: Four times a day (QID) | INTRAMUSCULAR | Status: DC | PRN
Start: 2014-11-24 — End: 2014-11-24

## 2014-11-24 MED ORDER — ROCURONIUM BROMIDE 50 MG/5ML IV SOLN
INTRAVENOUS | Status: AC
Start: 2014-11-24 — End: ?
  Filled 2014-11-24: qty 5

## 2014-11-24 MED ORDER — ONDANSETRON HCL 4 MG/2ML IJ SOLN
INTRAMUSCULAR | Status: AC
Start: 2014-11-24 — End: 2014-11-24
  Administered 2014-11-24: 4 mg via INTRAVENOUS
  Filled 2014-11-24: qty 2

## 2014-11-24 MED ORDER — ACETAMINOPHEN-CODEINE #3 300-30 MG PO TABS
ORAL_TABLET | ORAL | Status: AC
Start: 2014-11-24 — End: 2014-11-24
  Administered 2014-11-24: 1 via ORAL
  Filled 2014-11-24: qty 1

## 2014-11-24 MED ORDER — LACTATED RINGERS IV SOLN
INTRAVENOUS | Status: DC
Start: 2014-11-24 — End: 2014-11-24

## 2014-11-24 MED ORDER — LIDOCAINE-EPINEPHRINE 1 %-1:100000 IJ SOLN
INTRAMUSCULAR | Status: DC
Start: 2014-11-24 — End: 2014-11-24
  Filled 2014-11-24: qty 20

## 2014-11-24 MED ORDER — BUPIVACAINE-EPINEPHRINE (PF) 0.5% -1:200000 IJ SOLN
INTRAMUSCULAR | Status: DC
Start: 2014-11-24 — End: 2014-11-24
  Filled 2014-11-24: qty 20

## 2014-11-24 MED ORDER — PROMETHAZINE HCL 25 MG/ML IJ SOLN
6.2500 mg | Freq: Once | INTRAMUSCULAR | Status: DC | PRN
Start: 2014-11-24 — End: 2014-11-24

## 2014-11-24 MED ORDER — LACTATED RINGERS IV SOLN
INTRAVENOUS | Status: DC | PRN
Start: 2014-11-24 — End: 2014-11-24

## 2014-11-24 MED ORDER — ONDANSETRON HCL 4 MG/2ML IJ SOLN
INTRAMUSCULAR | Status: AC
Start: 2014-11-24 — End: ?
  Filled 2014-11-24: qty 2

## 2014-11-24 MED ORDER — BUPIVACAINE-EPINEPHRINE (PF) 0.5% -1:200000 IJ SOLN
INTRAMUSCULAR | Status: DC | PRN
Start: 2014-11-24 — End: 2014-11-24
  Administered 2014-11-24: 2 mL via INTRAMUSCULAR

## 2014-11-24 MED ORDER — LIDOCAINE HCL 2 % IJ SOLN
INTRAMUSCULAR | Status: DC | PRN
Start: 2014-11-24 — End: 2014-11-24
  Administered 2014-11-24: 100 mg via INTRAVENOUS

## 2014-11-24 MED ORDER — MIDAZOLAM HCL 2 MG/2ML IJ SOLN
INTRAMUSCULAR | Status: AC
Start: 2014-11-24 — End: ?
  Filled 2014-11-24: qty 2

## 2014-11-24 MED ORDER — ONDANSETRON HCL 4 MG/2ML IJ SOLN
INTRAMUSCULAR | Status: DC | PRN
Start: 2014-11-24 — End: 2014-11-24
  Administered 2014-11-24: 4 mg via INTRAVENOUS

## 2014-11-24 MED ORDER — GLYCOPYRROLATE 0.2 MG/ML IJ SOLN
INTRAMUSCULAR | Status: AC
Start: 2014-11-24 — End: ?
  Filled 2014-11-24: qty 3

## 2014-11-24 MED ORDER — ONDANSETRON HCL 4 MG/2ML IJ SOLN
4.0000 mg | Freq: Once | INTRAMUSCULAR | Status: AC | PRN
Start: 2014-11-24 — End: 2014-11-24

## 2014-11-24 MED ORDER — GLYCOPYRROLATE 0.2 MG/ML IJ SOLN
INTRAMUSCULAR | Status: DC | PRN
Start: 2014-11-24 — End: 2014-11-24
  Administered 2014-11-24 (×3): 0.2 mg via INTRAVENOUS

## 2014-11-24 MED ORDER — PROPOFOL 10 MG/ML IV EMUL (WRAP)
INTRAVENOUS | Status: AC
Start: 2014-11-24 — End: ?
  Filled 2014-11-24: qty 20

## 2014-11-24 MED ORDER — FENTANYL CITRATE (PF) 50 MCG/ML IJ SOLN (WRAP)
INTRAMUSCULAR | Status: DC | PRN
Start: 2014-11-24 — End: 2014-11-24
  Administered 2014-11-24: 100 ug via INTRAVENOUS

## 2014-11-24 MED ORDER — MIDAZOLAM HCL 2 MG/2ML IJ SOLN
INTRAMUSCULAR | Status: DC | PRN
Start: 2014-11-24 — End: 2014-11-24
  Administered 2014-11-24: 2 mg via INTRAVENOUS

## 2014-11-24 MED ORDER — SODIUM CHLORIDE 0.9 % IR SOLN
Status: DC | PRN
Start: 2014-11-24 — End: 2014-11-24
  Administered 2014-11-24: 1000 mL

## 2014-11-24 MED ORDER — GABAPENTIN 300 MG PO CAPS
300.0000 mg | ORAL_CAPSULE | Freq: Once | ORAL | Status: AC
Start: 2014-11-24 — End: 2014-11-24
  Administered 2014-11-24: 300 mg via ORAL

## 2014-11-24 MED ORDER — ACETAMINOPHEN 500 MG PO TABS
ORAL_TABLET | ORAL | Status: AC
Start: 2014-11-24 — End: ?
  Filled 2014-11-24: qty 2

## 2014-11-24 MED ORDER — FENTANYL CITRATE (PF) 50 MCG/ML IJ SOLN (WRAP)
50.0000 ug | INTRAMUSCULAR | Status: DC | PRN
Start: 2014-11-24 — End: 2014-11-24

## 2014-11-24 MED ORDER — GABAPENTIN 300 MG PO CAPS
300.0000 mg | ORAL_CAPSULE | Freq: Three times a day (TID) | ORAL | 0 refills | Status: AC
Start: 2014-11-24 — End: 2014-11-27
  Filled 2014-11-24: qty 9, 3d supply, fill #0

## 2014-11-24 MED ORDER — HYDROMORPHONE HCL 1 MG/ML IJ SOLN
0.5000 mg | INTRAMUSCULAR | Status: DC | PRN
Start: 2014-11-24 — End: 2014-11-24

## 2014-11-24 SURGICAL SUPPLY — 35 items
ADHESIVE SKIN CLOSURE DERMABOND ADVANCED (Skin Closure) ×1 IMPLANT
ADHESIVE SKIN CLOSURE DERMABOND ADVANCED .7 ML LIQUID APPLICATOR (Skin Closure) ×1 IMPLANT
ADHESIVE SKNCLS 2 OCTYL CYNCRLT .7ML (Skin Closure) ×1
APPLCATOR CHLORAPREP 26ML (Prep) ×2 IMPLANT
CONTAINER SPEC 8OZ NS SNPON LID TRNLU (Suction) ×2 IMPLANT
DRAPE CARDIOVASCULAR SURGICAL SMS 84IN 38IN (Drape) IMPLANT
DRAPE SRG SMS 84IN 38IN CVARTS 100X60IN (Drape)
GLOVE SURG BIOGEL INDIC SZ 8.5 (Glove) ×2 IMPLANT
GLOVE SURG BIOGEL SZ 9.0 GREEN (Glove) ×2 IMPLANT
GOWN SRG PE 3XL AURR 49IN LF STRL LVL 4 (Gown) ×1
GOWN SURGICAL 3XL L49 IN POLYETHYLENE (Gown) ×1
GOWN SURGICAL 3XL L49 IN POLYETHYLENE BLUE LEVEL 4 IMPERVIOUS (Gown) ×1 IMPLANT
KIT MAJOR FFX (Kits) ×2 IMPLANT
NEEDLE ELECTRODE (Cautery) IMPLANT
PAD ELECTROSRG GRND REM W CRD (Procedure Accessories) ×2 IMPLANT
SOL NACL .9% IRRIG 250ML NLTX (IV Solutions) ×1
SOLUTION IRR 0.9% NACL 1000ML LF STRL (Irrigation Solutions) ×1
SOLUTION IRRIGATION 0.9% SODIUM CHLORIDE (IV Solutions) ×1
SOLUTION IRRIGATION 0.9% SODIUM CHLORIDE (Irrigation Solutions) ×1
SOLUTION IRRIGATION 0.9% SODIUM CHLORIDE 1000 ML PLASTIC POUR BOTTLE (Irrigation Solutions) ×1 IMPLANT
SOLUTION IRRIGATION 0.9% SODIUM CHLORIDE 250 ML PLASTIC POUR BOTTLE (IV Solutions) ×1 IMPLANT
SPONGE GAUZE L6 3/4 IN X W6 IN MEDIUM (Dressing) ×1
SPONGE GAUZE L6 3/4 IN X W6 IN MEDIUM ABSORBENT FLUFF DRY CRINKLE (Dressing) ×1 IMPLANT
SPONGE GZE CTTN MED KRLX 6.75X6IN LF (Dressing) ×1
SPONGE LAP 18X18IN PREWASH WHT (Sponge)
SPONGE LAPAROTOMY L18 IN X W18 IN (Sponge)
SPONGE LAPAROTOMY L18 IN X W18 IN PREWASH WHITE (Sponge) IMPLANT
SUTURE ABS 3-0 SH VCL 27IN BRD COAT UD (Suture)
SUTURE COATED VICRYL 3-0 SH L27 IN BRAID (Suture) IMPLANT
SUTURE MONOCRYL 4-0 PS2 27IN (Suture) IMPLANT
SUTURE VICRYL 3-0 PS2 27IN (Suture) IMPLANT
TAPE MICROPORE SURGICAL 3INX10YDS (Tape) ×1
TAPE SURGICAL L10 YD X W3 IN STANDARD (Tape) ×1
TAPE SURGICAL L10 YD X W3 IN STANDARD HYPOALLERGENIC BREATHABLE (Tape) ×1 IMPLANT
TOWEL STERILE REUSABLE 8PK (Procedure Accessories) ×2 IMPLANT

## 2014-11-24 NOTE — Anesthesia Postprocedure Evaluation (Signed)
Anesthesia Post Evaluation    Patient: [name] [name]    Procedure(s) with comments:  EXCISION, LIPOMA OF BASE OF NECK - BACK LIPOMA EXCISION    Anesthesia type: general    Last Vitals:   Filed Vitals:    11/24/14 0950   BP: 144/73   Pulse: 66   Temp:    Resp:    SpO2: 98%       Patient Location: Phase I PACU      Post Pain: Patient not complaining of pain, continue current therapy    Mental Status: awake    Respiratory Function: tolerating face mask    Cardiovascular: stable    Nausea/Vomiting: patient not complaining of nausea or vomiting    Hydration Status: adequate    Post Assessment: no apparent anesthetic complications, no reportable events and no evidence of recall          Anesthesia Qualified Clinical Data Registry    Central Line      CVC insertion : NO                                               Perioperative temperature management      General/neuraxial anesthesia > or = 60 minutes (excluding CABG) : YES              > Use of intraoperative active warming : YES              > Temperature > or = 36 degrees Centigrade (96.8 degrees Farenheit) during time span from 30 minutes before up to 15 minutes after anesthesia end time : YES      Administration of antibiotic prophylaxis      Age > or = 18, with IV access, with surgical procedure for which antibiotic prophylaxis indicated, and not on chronic antibiotics : YES              > Prophylactic antibiotics within 1 hour of incision (or fluroroquinolone/vancomycin within 2 hours of incision) : YES    Medication Administration      Ordering or administration of drug inconsistent with intended drug, dose, delivery or timing : NO      Dental loss/damage      Dental injury with administration of anesthesia : NO      Difficult intubation due to unrecognized difficult airway        Elective airway procedure including but not limited to: tracheostomy, fiberoptic bronchoscopy, rigid bronchoscopy; jet ventilation; or elective use of a device to facilitate airway  management such as a Glidescope : NO                > Unanticipated difficult intubation post pre-evaluation : NO      Aspiration of gastric contents        Aspiration of gastric contents : NO                    Surgical fire        Procedure requiring electrocautery/laser : YES                > Ignition/burning in invasive procedure location : NO      Immediate perioperative cardiac arrest        Cardiac arrest in OR or PACU : NO  Unplanned hospital admission        Unplanned hospital admission for initially intended outpatient anesthesia service : NO      Unplanned ICU admission        Unplanned ICU admission related to anesthesia occurring within 24 hours of induction or start of MAC : NO      Surgical case cancellation        Cancellation of procedure after care already started by anesthesia care team : NO      Post-anesthesia transfer of care checklist/protocol to PACU        Transfer from OR to PACU upon case conclusion : YES              > Use of PACU transfer checklist/protocol : YES     (Includes the key elements of: patient identification, responsible practitioner identification (PACU nurse or advanced practitioner), discussion of pertinent history and procedure course, intraoperative anesthetic management, post-procedure plans, acknowledgement/questions)    Post-anesthesia transfer of care checklist/protocol to ICU        Transfer from OR to ICU upon case conclusion : NO                    Post-operative nausea/vomiting risk protocol        Post-operative nausea/vomiting risk protocol : YES  Patient > or = 18 with care initiated by anesthesia team that has a risk factor screen for post-op nausea/vomiting (Includes female, hx PONV, or motion sickness, non-smoker, intended opioid administration for post-op analgesia.)    Anaphylaxis        Anaphylaxis during anesthesia services : NO    (Inclusive of any suspected transfusion reaction in association with blood-bank confirmed blood product  incompatibility)              Caroleen Hamman, 11/24/2014 12:43 PM

## 2014-11-24 NOTE — Op Note (Signed)
FULL OPERATIVE NOTE    Date Time: 11/24/2014 8:43 AM  Patient Name: [name] [name]  Attending Physician: Lorenza Chick, MD      Date of Operation:   11/24/2014    Providers Performing:   Surgeon(s):  Lorenza Chick, MD  Tahja Liao, Ardyth Harps, MD    Circulator: Kinnie Scales, RN  Scrub Person: Burlene Arnt  Preceptor: Pincus Large, RN    Operative Procedure:   Procedure(s):  EXCISION, LIPOMA OF BASE OF NECK    Preoperative Diagnosis:   Pre-Op Diagnosis Codes:     * Lipoma of back [D17.1]    Postoperative Diagnosis:   Post-Op Diagnosis Codes:     * Lipoma of back [D17.1]    Indications:   The patient is a 59 year old female with a several year history of a slowly growing lipoma at the base of her neck and her upper back. She is bothered by the increasing size and cosmesis and would like it excised.    Operative Notes:   After informed consent was obtained, the patient was taken to the operating room and intubated by Anesthesia. She was turned to the operative table in prone position with all appropriate padding in place. Her posterior neck and upper back was prepped and draped in the usual fashion. All time outs were performed per protocol. 4 ml of local was injected over the planned incision. A transverse incision was made over the mass. Incision was taken down to the lipoma, which was dissected free from the surrounding soft tissue with a combination of sharp incision and electrocautery. The lipoma was approximately 4x5 cm and was sent to pathology. The wound was irrigated with saline and hemostasis was confirmed. Deep dermal sutures were placed with 3-0 Vicryl and the skin was closed with 4-0 Monocryl. Dermabond was used for dressing. The patient was returned to supine position and extubated. She was taken to PACU in stable condition. All counts were correct.    Estimated Blood Loss:   2 mL    Implants:   * No implants in log *    Drains:   Drains: no    Specimens:        SPECIMENS (last 24 hours)       Pathology Specimens       11/24/14 0800             Specimen Information    Specimen Testing Required Routine Pathology       Specimen ID  [id number]       Specimen Description LYPOMA OF BASE OF NECK           Complications:   none      Signed by: Illene Labrador, MD

## 2014-11-24 NOTE — Discharge Instructions (Signed)
[facility]: General Surgery & Surgical Specialties  Surgery Aftercare    YOUR PROCEDURE:  You have had a excision of a lipoma.    CONTACTING us AFTER SURGERY:  If you have any problems or questions after discharge, or need to schedule your follow-up appointment with Dr. Lorenza Chick  for {[phone] weeks after your procedure, please call our clinic office during business hours (8:30am-5pm) at [phone]. For problems after hours, the office number will reach our answering service, who will contact the doctor on call.  If you have difficulty contacting the answering service, call [phone] and ask to have Dr. Lorenza Chick  or the covering General Surgeon on call paged.  For a medical emergency, call 911.    HOME CARE INSTRUCTIONS AFTER YOUR SURGERY:   You may do most activities, except those that hurt your incision site.   You have special glue or tape on your skin that is protecting your incision.  It will start to fall off on its own in 2-3 weeks.  The stitches under the skin will dissolve. Keep the wound dry and clean. You may shower 24 hours after surgery. The wound may be washed gently with soap and water. Gently blot or dab dry following cleansing. Do not take baths, use swimming pools, or use hot tubs for ten days.    Once home, an ice pack applied to the operative site may help with discomfort and keep swelling down.  You can apply ice as often as needed.  After several days heat may help.   Many people find that Nonsteroidal Antiinflammatory drugs will help relieve their discomfort very well, without the narcotic side effects, especially if they are taken routinely for the first few days after surgery.  You may choose to take:   Ibuprofen (Advil, Motrin): 600-800mg  (3-4 of the over-the-counter pills) every 8 hours around-the-clock, with some food)   Naproxen (Aleve, Naprosyn): 250-500mg  (1-2 pills) every 12 hours around-the-clock, with some food   Celecoxib (Celebrex):  100-200mg  twice a day, around-the-clock   Acetaminophen (Tylenol): 650mg -1gm every 6 hours   Please check with your physician before using these medications if you have chronic liver or kidney diseases     You may be given a prescription for a narcotic pain medicine; a Docusate stool softener (which may be bought over-the-counter) may be taken while using the narcotic.  After 2-5 days many patients are able to wean themselves from the narcotic medications.  You do not have to use the pain pills if your discomfort is minimal.  If you do have trouble with constipation from the pain medication (no bowel movement for 2-3 days), you may use an over the counter laxative without contacting your surgeon.   Stimulant laxatives: Bisacodyl, Senna, castor oil   Osmotic laxatives: Milk of Magnesia, Magnesium Citrate or Miralax.  Glycerin or Sodium Phosphate enema.    Many patients find that for the first month or so after surgery, they get full very quickly.  Try to eat multiple (4-6) smaller meals daily, with healthy choices such as lean proteins.     CALL us IF:  There is redness, swelling, or increasing pain from a wound.   There is pus coming from a wound.   There is drainage from a wound lasting longer than one day.  An unexplained oral temperature above 102 F (38.9 C) develops.  You notice a foul smell coming from a wound or dressing.  There is a breaking open of the wound (  edges not staying together).  You notice increasing pain in the right lower part of your abdomen.  You notice increasing pain in the shoulders (shoulder strap areas) or at the operative site.  You develop dizzy episodes or fainting while standing.  You develop persistent nausea or vomiting.    SEEK IMMEDIATE MEDICAL CARE IF:  You develop a rash.  You have difficulty breathing.  You develop any reaction or side effects to medications given.      You have a skin dressing of Dermabond (skin glue), it is waterproof.    All stitches are below the  skin and dissolvable and do not need to be removed.      May shower, no soaking in tub for 2 weeks.      Take tylenol 1000mg  every 8 hr x 48 hrs post op and then every 8 hr as needed for pain (Do not exceed 4000mg  of tylenol in a 24hr period)    Take Neurontin every 8 hr x 48 hrs post op and then every 8 hr as needed for pain.  Hold Neurontin for excessive drowsiness.    Can take oxycodone 1-2 pills (5-10mg ) every 4 hrs as needed for pain if the above medications does not control the pain adequately.    No driving while taking oxycodone or other narcotics  Recommend stool softener while taking narcotics to avoid constipation.      Discharge Instructions: After Your Surgery  You've just had surgery. During surgery you were given medicine called anesthesia to keep you relaxed and free of pain. After surgery you may have some pain or nausea. This is common. Here are some tips for feeling better and getting well after surgery.    Going home  Your doctor or nurse will show you how to take care of yourself when you go home. He or she will also answer your questions. Have an adult family member or friend drive you home. For the first 24 hours after your surgery:   Do not drive or use heavy equipment.   Do not make important decisions or sign legal papers.   Do not drink alcohol.   Have someone stay with you, if needed. He or she can watch for problems and help keep you safe.  Be sure to go to all follow-up visits with your doctor. And rest after your surgery for as long as your doctor tells you to.  Coping with pain  If you have pain after surgery, pain medicine will help you feel better. Take it as told, before pain becomes severe. Also, ask your doctor or pharmacist about other ways to control pain. This might be with heat, ice, or relaxation. And follow any other instructions your surgeon or nurse gives you.  Tips for taking pain medicine  To get the best relief possible, remember these points:   Pain medicines can  upset your stomach. Taking them with a little food may help.   Most pain relievers taken by mouth need at least 20 to 30 minutes to start to work.   Taking medicine on a schedule can help you remember to take it. Try to time your medicine so that you can take it before starting an activity. This might be before you get dressed, go for a walk, or sit down for dinner.   Constipation is a common side effect of pain medicines. Call your doctor before taking any medicines such as laxatives or stool softeners to help ease constipation. Also ask if  you should skip any foods. Drinkinglots of fluids andeating foodssuch as fruits and vegetables that are high in fiber can also help. Remember, do not take laxatives unless your surgeon has prescribed them.   Drinking alcohol and taking pain medicine can cause dizziness and slow your breathing. It can even be deadly. Do not drink alcohol while taking pain medicine.   Pain medicine can make you react more slowly to things. Do not drive or run machinery while taking pain medicine.  Your health care providermay tell you to take acetaminophen to help ease your pain. Ask him or her how much you are supposed to take each day. Acetaminophen or other pain relievers may interact with your prescription medicines or other over-the-counter (OTC) drugs. Some prescription medicines have acetaminophen and other ingredients.Using both prescription and OTC acetaminophenfor paincan cause you to overdose. Readthe labels on your OTC medicineswith care. This will help youto clearly know the list of ingredients, how much to take, and anywarnings. It may also help you not take too muchacetaminophen.If you have questions or do not understand the information, ask your pharmacist or health care provider to explain it to you before you take the OTC medicine.  Managing nausea  Some people have an upset stomach after surgery. This is often because of anesthesia, pain, or pain medicine, or  the stress of surgery. These tips will help you handle nausea and eat healthy foods as you get better. If you were on a special food plan before surgery, ask your doctor if you should follow it while you get better. These tips may help:   Do not push yourself to eat. Your body will tell you when to eat and how much.   Start off with clear liquids and soup. They are easier to digest.   Next try semi-solid foods, such as mashed potatoes, applesauce, and gelatin, as you feel ready.   Slowly move to solid foods. Don't eat fatty, rich, or spicy foods at first.   Do not force yourself to have 3 large meals a day. Instead eat smaller amounts more often.   Take pain medicines with a small amount of solid food, such as crackers or toast, to avoid nausea.         If you have obstructive sleep apnea  You were given anesthesia medicine during surgery to keep you comfortable and free of pain. After surgery, you may have more apnea spells because of this medicine and other medicines you were given. The spells may last longer than usual.  At home:   Keep using the continuous positive airway pressure (CPAP) device when you sleep. Unless your health care provider tells you not to, use it when you sleep, day or night. CPAP is a common device used to treat obstructive sleep apnea.   Talk with your provider before taking any pain medicine, muscle relaxants, or sedatives. Your provider will tell you about the possible dangers of taking these medicines.   [address] [employer] [address], [place], [place] [address]. All rights reserved. This information is not intended as a substitute for professional medical care. Always follow your healthcare professional's instructions.

## 2014-11-24 NOTE — Transfer of Care (Signed)
Anesthesia Transfer of Care Note    Patient: [name] [name]    Procedures performed: Procedure(s) with comments:  EXCISION, LIPOMA OF BASE OF NECK - BACK LIPOMA EXCISION    Anesthesia type: General ETT    Patient location:Phase I PACU    Last vitals:   Filed Vitals:    11/24/14 0842   BP: 152/72   Pulse: 82   Temp: 36.2 C (97.2 F)   Resp: 14   SpO2: 100%       Post pain: Patient not complaining of pain, continue current therapy      Mental Status:awake and alert     Respiratory Function: tolerating face mask    Cardiovascular: stable    Nausea/Vomiting: patient not complaining of nausea or vomiting    Hydration Status: adequate    Post assessment: no apparent anesthetic complications and no reportable events

## 2014-11-24 NOTE — H&P (Addendum)
HISTORY AND PHYSICAL EXAM  Surgery  Team 5: General Surgery, [facility] [phone]    Date Time: 11/24/2014 6:55 AM  Patient Name: [name] [name]  Attending Physician: Lorenza Chick, MD  Chief Complaint: lipoma     History of Present Illness:   [name] [name] is a 59 y.o. female who presents to the hospital for elective excision of her posterior neck lipoma, which has been present for several years, but has grown and bothers her cosmetically. She would likely to have it removed.    Past Medical History:     Past Medical History   Diagnosis Date   . Seasonal allergies    . Hyperlipidemia    . 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 Lipoma      base of posterior neck       Past Surgical History:     Past Surgical History   Procedure Laterality Date   . Lasik  2003   . Excision, melanoma Bilateral 05/19/2014     Procedure: EXCISION, MELANOMA;  Surgeon: Lorenza Chick, MD;  Location: [facility] OR;  Service: General;  Laterality: Bilateral;  LEFT SHOULDER MELANOMA WIDE LOCAL EXCISION, RIGHT MID-BACK ATYPICAL NEVUS EXCISION     . Biopsy, sentinel node N/A 05/19/2014     Procedure: BIOPSY, SENTINEL NODE;  Surgeon: Lorenza Chick, MD;  Location: [facility] OR;  Service: General;  Laterality: N/A;   SENTINEL LYMPH NODE BIOSPY W/ NUC MED @ 8:00AM,        Family History:     Family History   Problem Relation Age of Onset   . Breast cancer Maternal Grandmother    . Cancer Maternal Grandmother      breast   . Hypertension Father    . Hyperlipidemia Father    . Aneurysm Father    . Heart disease Father      unsure of age   . Thyroid disease Sister    . Asthma Brother    . Hyperlipidemia Brother    . ADD / ADHD Daughter    . Asthma Daughter    . Alcohol abuse Maternal Uncle    . Alcohol abuse Maternal Grandfather        Social History:     History     Social History   . Marital Status: Single     Spouse Name: N/A   . Number of Children: N/A   . Years of Education: N/A     Social History Main Topics   .  Smoking status: Former Smoker -- 1.00 packs/day for 35 years     Types: Cigarettes     Quit date: 10/29/2002   . Smokeless tobacco: Never Used   . Alcohol Use: 0.0 oz/week      Comment: drinks wine nightly (2 glasses); very rare other alcohol   . Drug Use: No   . Sexual Activity:     Partners: Male     Birth Control/ Protection: None     Other Topics Concern   . Not on file     Social History Narrative       Allergies:     Allergies   Allergen Reactions   . Ciprofloxacin Swelling       Medications:     Prior to Admission medications    Medication Sig Start Date End Date Taking? Authorizing Provider   ACETAMINOPHEN ER PO Take by mouth as needed.   Yes [provider]  Ascorbic Acid (VITAMIN C) 500 MG tablet Take 500 mg by mouth daily.   Yes [provider]   aspirin EC 81 MG EC tablet Take 81 mg by mouth daily.   Yes [provider]   atorvastatin (LIPITOR) 10 MG tablet Take 1 tablet (10 mg total) by mouth daily. 11/22/14  Yes Bauer, Christina A, DO   Cholecalciferol (VITAMIN D-3) 5000 UNITS TABS Take by mouth.   Yes [provider]   loratadine (CLARITIN) 10 MG tablet Take 10 mg by mouth daily.   Yes [provider]   Omega-3 Fatty Acids (FISH OIL) 1200 MG CAPS Take 1,200 mg by mouth 2 (two) times daily.   Yes [provider]   vitamin B-12 (CYANOCOBALAMIN) 1000 MCG tablet Take 1,000 mcg by mouth once a week.   Yes [provider]       Review of Systems:   General ROS: negative  ENT ROS: negative  Cardiovascular ROS: no chest pain or dyspnea on exertion  Respiratory ROS: no cough, shortness of breath, or wheezing  Gastrointestinal ROS: no abdominal pain, change in bowel habits, or black or bloody stools  Genito-Urinary ROS: no dysuria, trouble voiding, or hematuria  Hematological and Lymphatic ROS: negative  Endocrine ROS: negative  Musculoskeletal ROS: negative  Neurological ROS: no TIA or stroke symptoms     Physical Exam:     Filed Vitals:     11/24/14 0643   BP: 136/65   Pulse: 72   Temp: 98 F (36.7 C)   SpO2: 96%       Body mass index is 22.82 kg/(m^2).    Intake and Output Summary (Last 24 hours) at Date Time  No intake or output data in the 24 hours ending 11/24/14 0655    General appearance - alert, well appearing, and in no distress  Mental status - alert, oriented to person, place, and time  Eyes - sclera anicteric  Mouth - mucous membranes moist, pharynx normal without lesions  Chest - no tachypnea, retractions or cyanosis  Heart - normal rate and regular rhythm  Abdomen - soft, nontender, nondistended, no masses or organomegaly  Neurological - alert, oriented, normal speech, no focal findings or movement disorder noted  Extremities - no pedal edema noted  Skin - normal coloration and turgor, no rashes, no suspicious skin lesions noted  5x5 soft tissue mass on the midline posterior upper back/neck. No skin changes, nontender      Problem List:     Patient Active Problem List   Diagnosis   . Hypertriglyceridemia   . Hyperlipidemia with target LDL less than 100   . Allergy   . Subclinical hypothyroidism   . Elevated blood pressure reading without diagnosis of hypertension   . Plantar fasciitis, bilateral   . Alcohol use disorder   . Macrocytosis   . Low vitamin B12 level   . Malignant melanoma of left shoulder   . Skin lesion       Assessment:   59 y.o. female with posterior neck lipoma    Plan:   Proceed with planned procedure  Consent signed  Pre-op abx, tylenol, gabapentin    Signed by: Wenda Low A Allred  Patient was reexamined within 24 hours of surgery and no changes noted.  Floyde Parkins. Willeen Cass MD, FACS

## 2014-11-26 ENCOUNTER — Encounter: Payer: Self-pay | Admitting: Surgery

## 2014-11-27 ENCOUNTER — Telehealth (INDEPENDENT_AMBULATORY_CARE_PROVIDER_SITE_OTHER): Payer: Self-pay | Admitting: Family Medicine

## 2014-11-27 DIAGNOSIS — E785 Hyperlipidemia, unspecified: Secondary | ICD-10-CM

## 2014-11-27 DIAGNOSIS — E538 Deficiency of other specified B group vitamins: Secondary | ICD-10-CM

## 2014-11-27 DIAGNOSIS — D7589 Other specified diseases of blood and blood-forming organs: Secondary | ICD-10-CM

## 2014-11-27 LAB — LAB USE ONLY - HISTORICAL SURGICAL PATHOLOGY

## 2014-11-27 NOTE — Telephone Encounter (Signed)
Called re test results

## 2014-11-27 NOTE — Telephone Encounter (Signed)
Spoke to patient, reviewed lab results.   Discussed [place] CAT[facility] program for ETOH, pt states she has no desire to stop or drink less ETOH.    States when she is not drinking she denies any physical symptomatology.   Discussed elevated TG - importance to reduce etoh, dietary modification.  Resume omega 3 when ok by surgery.  Advised to take vitamin b12 1000 mcg daily and recheck b12 in 4 weeks. Will also check folate.   Recheck lipid in 8 weeks.

## 2014-12-14 ENCOUNTER — Encounter (INDEPENDENT_AMBULATORY_CARE_PROVIDER_SITE_OTHER): Payer: Self-pay | Admitting: Surgery

## 2014-12-14 ENCOUNTER — Ambulatory Visit (INDEPENDENT_AMBULATORY_CARE_PROVIDER_SITE_OTHER): Payer: BC Managed Care – PPO | Admitting: Surgery

## 2014-12-14 VITALS — BP 134/81 | HR 81 | Temp 97.4°F | Ht 62.0 in | Wt 124.0 lb

## 2014-12-14 DIAGNOSIS — Z9889 Other specified postprocedural states: Secondary | ICD-10-CM

## 2014-12-17 NOTE — Progress Notes (Signed)
[name] [name] underwent excision of a subcutaneous mass on the back of her neck on 6/17; the final pathology report confirms this was a benign lipoma.She reports she is doing well and has no complaints.    PE: AVSS  Incision healing well. Slight seroma    Imp: Doing well following excision of benign lipoma    Plan: I explained again the benign nature of lipomas; recurrences are rare. The seroma will almost certainly be reabsorbed on its own.I will step back from her care.

## 2015-01-05 ENCOUNTER — Other Ambulatory Visit: Payer: Self-pay | Admitting: Obstetrics & Gynecology

## 2015-01-05 DIAGNOSIS — Z1231 Encounter for screening mammogram for malignant neoplasm of breast: Secondary | ICD-10-CM

## 2015-01-10 ENCOUNTER — Ambulatory Visit
Admission: RE | Admit: 2015-01-10 | Discharge: 2015-01-10 | Disposition: A | Discharge status: Home / Self Care | Payer: BC Managed Care – PPO | Source: Ambulatory Visit | Attending: Obstetrics & Gynecology | Admitting: Obstetrics & Gynecology

## 2015-01-10 ENCOUNTER — Ambulatory Visit (INDEPENDENT_AMBULATORY_CARE_PROVIDER_SITE_OTHER): Payer: BC Managed Care – PPO | Admitting: Dermatology

## 2015-01-10 ENCOUNTER — Encounter (INDEPENDENT_AMBULATORY_CARE_PROVIDER_SITE_OTHER): Payer: Self-pay | Admitting: Dermatology

## 2015-01-10 ENCOUNTER — Other Ambulatory Visit: Payer: Self-pay | Admitting: Obstetrics & Gynecology

## 2015-01-10 ENCOUNTER — Ambulatory Visit (INDEPENDENT_AMBULATORY_CARE_PROVIDER_SITE_OTHER): Payer: BC Managed Care – PPO | Admitting: Hematology & Oncology

## 2015-01-10 ENCOUNTER — Encounter (INDEPENDENT_AMBULATORY_CARE_PROVIDER_SITE_OTHER): Payer: Self-pay | Admitting: Hematology & Oncology

## 2015-01-10 VITALS — BP 151/83 | HR 88 | Temp 99.0°F | Ht 62.0 in | Wt 124.8 lb

## 2015-01-10 DIAGNOSIS — Z1231 Encounter for screening mammogram for malignant neoplasm of breast: Secondary | ICD-10-CM

## 2015-01-10 DIAGNOSIS — R922 Inconclusive mammogram: Secondary | ICD-10-CM

## 2015-01-10 DIAGNOSIS — C4362 Malignant melanoma of left upper limb, including shoulder: Secondary | ICD-10-CM

## 2015-01-10 NOTE — Progress Notes (Signed)
[facility]  [facility]     Chief Complaint   Patient presents with   . 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 She reports 2 weeks ago she noticed a new mass on the left lateral hip which is uncomfortable when she walks or moves around. Otherwise she denies any other new, changing, bleeding, or itching skin lesions. No tender or palpable lymph nodes. No recent weight loss, fevers, chest pain, cough, shortness of breath. She had the lipoma on her upper back noted during her previous visit removed in 06/16 by Dr. Willeen Cass.     Detailed:  [name] [name] is 59 y.o. female with a history of Stage 1B Left posterior shoulder.Patient noted a growing mole which became like a "pinkish/purpleish blister"  over the past 5 months, + bleeding. Saw her dermatologist Dr. Christella Noa who recommended excisional biopsy which was performed by Dr. Webb Silversmith on 04/06/14. Pathology showed 1.47 mm thick, non-ulcerated melanoma with 2 mitoses/mm2. Peripheral margin was close with melanoma in situ. She underwent wide local excision and sentinel lymph node biopsy on 05/19/14 which showed no residual melanoma at primary site and two negative left supraclavicular lymph nodes. Pre op CXR and LDH were normal.    No personal/family history of skin cancer. No tanning bed use. No blistering sunburns.      Current Outpatient Prescriptions   Medication Sig Dispense Refill   . ACETAMINOPHEN ER PO Take by mouth as needed.     . Ascorbic Acid (VITAMIN C) 500 MG tablet Take 500 mg by mouth daily.     Marland Kitchen aspirin EC 81 MG EC tablet Take 81 mg by mouth daily.     Marland Kitchen atorvastatin (LIPITOR) 10 MG tablet Take 1 tablet (10 mg total) by mouth daily. 90 tablet 1   . calcium & magnesium carbonates (MYLANTA) 311-232 MG per tablet Take 1 tablet by mouth daily.     . Cholecalciferol (VITAMIN D-3) 5000 UNITS  TABS Take by mouth.     . loratadine (CLARITIN) 10 MG tablet Take 10 mg by mouth daily.     . Omega-3 Fatty Acids (FISH OIL) 1200 MG CAPS Take 1,200 mg by mouth 2 (two) times daily.     Marland Kitchen oxyCODONE (ROXICODONE) 5 MG immediate release tablet Take 1 tablet (5 mg total) by mouth every 4 (four) hours as needed for Pain. 10 tablet 0   . vitamin B-12 (CYANOCOBALAMIN) 1000 MCG tablet Take 1,000 mcg by mouth once a week.       No current facility-administered medications for this visit.     Allergies   Allergen Reactions   . Ciprofloxacin Swelling     Past Medical History   Diagnosis Date   . Seasonal allergies    . Hyperlipidemia    . 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 Lipoma      base of posterior neck     Past Surgical History   Procedure Laterality Date   . Lasik  2003   . Excision, melanoma Bilateral 05/19/2014     Procedure: EXCISION, MELANOMA;  Surgeon: Lorenza Chick, MD;  Location: [facility] OR;  Service: General;  Laterality: Bilateral;  LEFT SHOULDER MELANOMA WIDE LOCAL EXCISION, RIGHT MID-BACK ATYPICAL NEVUS EXCISION     . Biopsy, sentinel node N/A  05/19/2014     Procedure: BIOPSY, SENTINEL NODE;  Surgeon: Lorenza Chick, MD;  Location: [facility] OR;  Service: General;  Laterality: N/A;   SENTINEL LYMPH NODE BIOSPY W/ NUC MED @ 8:00AM,    . Excision, lipoma N/A 11/24/2014     Procedure: EXCISION, LIPOMA OF BASE OF NECK;  Surgeon: Lorenza Chick, MD;  Location: [facility];  Service: General;  Laterality: N/A;  BACK LIPOMA EXCISION     Family History   Problem Relation Age of Onset   . Breast cancer Maternal Grandmother    . Cancer Maternal Grandmother      breast   . Hypertension Father    . Hyperlipidemia Father    . Aneurysm Father    . Heart disease Father      unsure of age   . Thyroid disease Sister    . Asthma Brother    . Hyperlipidemia Brother    . ADD / ADHD Daughter    . Asthma Daughter    . Alcohol abuse Maternal Uncle    . Alcohol abuse Maternal Grandfather       Social History     Social History   . Marital Status: Single     Spouse Name: N/A   . Number of Children: N/A   . Years of Education: N/A     Occupational History   . Not on file.     Social History Main Topics   . Smoking status: Former Smoker -- 1.00 packs/day for 35 years     Types: Cigarettes     Quit date: 10/29/2002   . Smokeless tobacco: Never Used   . Alcohol Use: 0.0 oz/week      Comment: drinks wine nightly (2 glasses); very rare other alcohol   . Drug Use: No   . Sexual Activity:     Partners: Male     Birth Control/ Protection: None     Other Topics Concern   . Not on file     Social History Narrative       REVIEW OF SYSTEMS  Const - no fever no chills  CV - no chest pain  Resp- no cough  Neuro - no headache  Abd - no pain  Integ - See HPI  Psych - no depression or anxiety  All other systems reviewed and negative    Objective:     Filed Vitals:    01/10/15 1431   BP: 151/83   Pulse: 88   Temp: 99 F (37.2 C)   SpO2: 98%      Constitutional: General appearance: NAD, conversant    HEENT: normocephalic, atraumatic. Conjunctiva, lids, lips, teeth, gums and oropharynx examined with no suspicious lesions    SKIN:A total body skin examination is completed including:   - palpation of scalp and inspection of hair of scalp, eyebrows, face, chest and extremities   - inspection of eccrine and apocrine glands.   - inspection and/or palpation of the skin and subcutaneous tissues with the following anatomic skin sites examined with the following findings:   1. Head including face - no lesions of concern  2. Neck  - no lesions of concern  3. Chest including breasts, and axillae - no lesions of concern  4. Abdomen - no lesions of concern  5. Genitalia, groin, buttocks - no lesions of concern  6. Back -   a. A. Posterior base of neck-healed scar (excised lipoma site)  7. Right upper extremity - no lesions  of concern  8. Left upper extremity -   a. posterior shoulder 6 cm well healed scar (MM site 10/15) no  pigmentation or nodulatrity  9. Right lower extremity - no lesions of concern  10. Left lower extremity -   a. Left outer thigh - tender 11 cm mobile subcutaneous nodule with consistency of fat (lesion of concern, to be further evaluated by ultrasound)     - Dermatoscopy was used to evaluate the nevi.   - General skin: multiple brown macules, size 2-6mm, approximately 50 in number, distributed primarily on the trunk. Extensive photodamage and rhytids    LYMPHATIC:Examination of lymph node basins is completed, including the H and N, axilla and groin. There is no lymphadenopathy.    NECK: supple, no masses    CVS: no swelling, varicosities or edema    EXTREMITIES: inspection and palpation of digits and nails revealed no abnormalities    GI:  External anus without suspicious lesions    NEURO/PSYCH: alert and oriented, pleasant      Radiologic Studies None    Pathology reports   Date 04/06/14  Accession Number [id number])  Location Lt Post Shoulder  Dx MM   Clark Level IV   Thickness 1.62mm   Mitotic Rate 2 per mm   Ulceration Absent   Regression Absent       Assessment & Plan:   59 y.o. female with a history of Stage 1B Left posterior shoulder 04/06/14 s/p WLE and negative SLN     1. Melanoma: Stage 1B Left posterior shoulder 03/2014  - no evidence of recurrence    2. TBSE  - Very few nevi on exam  -Sun protective measure reviewed, discussed risk factors for skin cancer and how to avoid excessive sun exposure. Reviewed use of sunscreens with appropriate broad spectrum coverage, sun protective clothing and other measures to avoid further damage.   -ABCDE's of melanoma reviewed  -Monthly self skin exam was advised and the patient was instructed to return to the clinic prior to scheduled visit if any worrisome findings are discovered.    3. Growing tumor left outer thigh  - On physical exam this is most c/w lipoma  - Due to sxs and growth this requires further workup to r/o melanoma recurrence or other malignancy. Plan  for ultrasound.       -Next TBSE in 3 months, concurrent visit with Dr. Crosby Oyster (Recommend TBSE with Dr. Christella Noa, patient prefers to have her skin checks here)      Piedad Climes  [facility] M3    Suraj S. Ruel Favors, MD, FAAD  Medical Director, [facility]  [facility]  [phone] (t)   [phone] (f)     I personally reviewed the patients history, pathology when relevant, and completed an independent skin examination. I agree with all components of this written note. I have made addendums where/if needed. S. VENNA.

## 2015-01-10 NOTE — Progress Notes (Signed)
[facility]    Chief Complaint   Patient presents with   . follow up     HISTORY OF PRESENT ILLNESS  [name] [name] is 59 y.o. female who noted a growing mole in left shoulder which became like "blister" over 5 months. Also noted bleeding from the lesion. Saw her dermatologist Dr. Christella Noa who recommended excisional biopsy which was performed by Dr. Webb Silversmith on 04/06/14. Pathology showed 1.47 mm thick, non-ulcerated melanoma with 2 mitoses/mm2. Peripheral margin was close with melanoma in situ. Preop CXR and LDH were normal. She underwent wide local excision and sentinel lymph node biopsy on 05/19/14 which showed no residual melanoma at primary site and two negative left supraclavicular lymph nodes.    She's here for surveillance. She had lipoma in base of neck removed by Dr.Hafner in June, 2016. She noticed new soft lump in left hip area for the last 2 weeks.   No palpable lymph node. Patient denies any rash/itching, fatigue, weight loss, headache, cough, shortness of breath, nausea, vomiting, diarrhea or abdominal pain.       Current Outpatient Prescriptions   Medication Sig Dispense Refill   . ACETAMINOPHEN ER PO Take by mouth as needed.     . Ascorbic Acid (VITAMIN C) 500 MG tablet Take 500 mg by mouth daily.     Marland Kitchen aspirin EC 81 MG EC tablet Take 81 mg by mouth daily.     Marland Kitchen atorvastatin (LIPITOR) 10 MG tablet Take 1 tablet (10 mg total) by mouth daily. 90 tablet 1   . calcium & magnesium carbonates (MYLANTA) 311-232 MG per tablet Take 1 tablet by mouth daily.     . Cholecalciferol (VITAMIN D-3) 5000 UNITS TABS Take by mouth.     . loratadine (CLARITIN) 10 MG tablet Take 10 mg by mouth daily.     . Omega-3 Fatty Acids (FISH OIL) 1200 MG CAPS Take 1,200 mg by mouth 2 (two) times daily.     Marland Kitchen oxyCODONE (ROXICODONE) 5 MG immediate release tablet Take 1 tablet (5 mg total) by mouth every 4 (four) hours as needed for Pain. 10 tablet 0   . vitamin B-12 (CYANOCOBALAMIN) 1000 MCG tablet Take  1,000 mcg by mouth once a week.       No current facility-administered medications for this visit.     Allergies   Allergen Reactions   . Ciprofloxacin Swelling     Past Medical History   Diagnosis Date   . Seasonal allergies    . Hyperlipidemia    . 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 Lipoma      base of posterior neck     Past Surgical History   Procedure Laterality Date   . Lasik  2003   . Excision, melanoma Bilateral 05/19/2014     Procedure: EXCISION, MELANOMA;  Surgeon: Lorenza Chick, MD;  Location: [facility] OR;  Service: General;  Laterality: Bilateral;  LEFT SHOULDER MELANOMA WIDE LOCAL EXCISION, RIGHT MID-BACK ATYPICAL NEVUS EXCISION     . Biopsy, sentinel node N/A 05/19/2014     Procedure: BIOPSY, SENTINEL NODE;  Surgeon: Lorenza Chick, MD;  Location: [facility] OR;  Service: General;  Laterality: N/A;   SENTINEL LYMPH NODE BIOSPY W/ NUC MED @ 8:00AM,    . Excision, lipoma N/A 11/24/2014     Procedure: EXCISION, LIPOMA OF BASE OF NECK;  Surgeon: Lorenza Chick, MD;  Location: [facility];  Service:  General;  Laterality: N/A;  BACK LIPOMA EXCISION     Family History   Problem Relation Age of Onset   . Breast cancer Maternal Grandmother    . Cancer Maternal Grandmother      breast   . Hypertension Father    . Hyperlipidemia Father    . Aneurysm Father    . Heart disease Father      unsure of age   . Thyroid disease Sister    . Asthma Brother    . Hyperlipidemia Brother    . ADD / ADHD Daughter    . Asthma Daughter    . Alcohol abuse Maternal Uncle    . Alcohol abuse Maternal Grandfather      Social History     Social History   . Marital Status: Single     Spouse Name: N/A   . Number of Children: N/A   . Years of Education: N/A     Occupational History   . Not on file.     Social History Main Topics   . Smoking status: Former Smoker -- 1.00 packs/day for 35 years     Types: Cigarettes     Quit date: 10/29/2002   . Smokeless tobacco: Never Used   . Alcohol Use: 0.0  oz/week      Comment: drinks wine nightly (2 glasses); very rare other alcohol   . Drug Use: No   . Sexual Activity:     Partners: Male     Birth Control/ Protection: None     Other Topics Concern   . Not on file     Social History Narrative       REVIEW OF SYSTEMS  All other systems were reviewed and are negative except as previously noted in the HPI.       Objective:     Filed Vitals:    01/10/15 1430   BP: 151/83   Pulse: 88   Temp: 99 F (37.2 C)   SpO2: 98%     ECOG PS: 0  General: Vitals Reviewed. NAD  Eyes: Anicteric, normal conjunctiva  ENT: No sores in mouth  CV: RRR  RESP: CTA B  GI: NT/ND, no HSM  Neuro: AAO x 3  MS: no swelling in arms or legs  SKIN: 8 cm wide local excision scar in left shoulder without swelling, bleeding or tenderness. No evidence of satellite or in-transit lesion. 9cm soft movable mass in left lateral upper thigh     LYMPH: negative cervical suplarclavicular axillary inguinal.     Pathology reports   Date 04/06/14  Accession Number [id number])  Location Lt Post Shoulder  Dx MM   Clark Level IV   Thickness 1.47mm   Mitotic Rate 2 per mm   Ulceration Absent   Regression Absent       Assessment & Plan:   1. Melanoma: Stage 1B Left posterior shoulder   No clinical evidence of recurrence.   CXR, LDH today. If normal, return to clinic in 4-6 months.     2. Left thigh mass  Likely lipoma. Ordered ultrasound for further evaluation    Tiney Rouge, MD   Medical Oncology  Director, Melanoma and Cutaneous Oncology Therapeutics and Research  [facility]  P [phone]

## 2015-01-11 ENCOUNTER — Telehealth (INDEPENDENT_AMBULATORY_CARE_PROVIDER_SITE_OTHER): Payer: Self-pay

## 2015-01-11 ENCOUNTER — Encounter (INDEPENDENT_AMBULATORY_CARE_PROVIDER_SITE_OTHER): Payer: Self-pay | Admitting: Hematology & Oncology

## 2015-01-11 LAB — LACTATE DEHYDROGENASE: LDH: 212 U/L (ref 120–250)

## 2015-01-11 NOTE — Telephone Encounter (Signed)
Informed normal chest PA & Lateral result.

## 2015-01-11 NOTE — Telephone Encounter (Signed)
-----   Message from Tiney Rouge, MD sent at 01/11/2015 10:18 AM EDT -----  Normal LDH

## 2015-01-11 NOTE — Telephone Encounter (Signed)
Spoke with patient in regards to normal LDH levels DOS 01/10/15.

## 2015-01-12 ENCOUNTER — Ambulatory Visit
Admission: RE | Admit: 2015-01-12 | Discharge: 2015-01-12 | Disposition: A | Discharge status: Home / Self Care | Payer: BC Managed Care – PPO | Source: Ambulatory Visit | Attending: Obstetrics & Gynecology | Admitting: Obstetrics & Gynecology

## 2015-01-12 DIAGNOSIS — R922 Inconclusive mammogram: Secondary | ICD-10-CM | POA: Insufficient documentation

## 2015-01-18 ENCOUNTER — Encounter (INDEPENDENT_AMBULATORY_CARE_PROVIDER_SITE_OTHER): Payer: Self-pay

## 2015-01-18 ENCOUNTER — Encounter (INDEPENDENT_AMBULATORY_CARE_PROVIDER_SITE_OTHER): Payer: Self-pay | Admitting: Hematology & Oncology

## 2015-01-18 ENCOUNTER — Other Ambulatory Visit (INDEPENDENT_AMBULATORY_CARE_PROVIDER_SITE_OTHER): Payer: Self-pay

## 2015-01-18 ENCOUNTER — Telehealth (INDEPENDENT_AMBULATORY_CARE_PROVIDER_SITE_OTHER): Payer: Self-pay

## 2015-01-18 DIAGNOSIS — D2372 Other benign neoplasm of skin of left lower limb, including hip: Secondary | ICD-10-CM

## 2015-01-18 NOTE — Telephone Encounter (Signed)
Spoke with [name] [name] to notify her of the results of the left hip ultrasound performed on 01/17/2015.  Reported that the mass is most likely a lipoma but an MRI of the left hip is recommended for further evaluation.  Patient verbalized understanding and will call to schedule an appointment.

## 2015-01-18 NOTE — Progress Notes (Signed)
MRI of left hip with and without contrast order faxed to [facility] center.  Patient scheduled for 02/01/2015.

## 2015-02-01 ENCOUNTER — Ambulatory Visit: Payer: BC Managed Care – PPO

## 2015-02-05 ENCOUNTER — Telehealth (INDEPENDENT_AMBULATORY_CARE_PROVIDER_SITE_OTHER): Payer: Self-pay

## 2015-02-05 NOTE — Telephone Encounter (Signed)
Received a call from [facility].  Patient reports she was unable to tolerate the confinement of the MRI.  Will notify MD.

## 2015-02-19 ENCOUNTER — Telehealth (INDEPENDENT_AMBULATORY_CARE_PROVIDER_SITE_OTHER): Payer: Self-pay

## 2015-02-19 NOTE — Telephone Encounter (Signed)
Left a message with Sumner Boast to inform her that after consulting, Dr. Crosby Oyster and Dr. Willeen Cass it is  Recommended that a follow up appointment with Dr. Willeen Cass be Su [place] the MRI was not able to be completed due to patient inability to tolerate.  Explained to patient that Dr. Talmage Nap office has been notified that patient will be calling for appointment.  Encouraged patient to call with any questions or concerns.

## 2015-02-19 NOTE — Telephone Encounter (Signed)
Left a message for Dr. Roger Shelter Hafner's schedule to be alerted to a phone call from [facility] to schedule an appointment.  Left my contact info if there are any questions.

## 2015-03-07 ENCOUNTER — Institutional Professional Consult (permissible substitution) (INDEPENDENT_AMBULATORY_CARE_PROVIDER_SITE_OTHER): Payer: BC Managed Care – PPO | Admitting: Surgery

## 2015-03-21 ENCOUNTER — Ambulatory Visit (INDEPENDENT_AMBULATORY_CARE_PROVIDER_SITE_OTHER): Payer: BC Managed Care – PPO | Admitting: Surgery

## 2015-03-21 ENCOUNTER — Institutional Professional Consult (permissible substitution) (INDEPENDENT_AMBULATORY_CARE_PROVIDER_SITE_OTHER): Payer: BC Managed Care – PPO | Admitting: Surgery

## 2015-03-21 ENCOUNTER — Encounter (INDEPENDENT_AMBULATORY_CARE_PROVIDER_SITE_OTHER): Payer: Self-pay | Admitting: Surgery

## 2015-03-21 VITALS — BP 137/84 | HR 87 | Temp 98.3°F | Ht 62.0 in | Wt 126.0 lb

## 2015-03-21 DIAGNOSIS — M799 Soft tissue disorder, unspecified: Secondary | ICD-10-CM

## 2015-03-21 DIAGNOSIS — M7989 Other specified soft tissue disorders: Secondary | ICD-10-CM

## 2015-03-21 NOTE — Progress Notes (Signed)
Marissah became aware of a soft subcutaneous mass on her left upper thigh about two months ago. She was told that an [place] would not be useful by a radiologist for this. She saw Dr. Crosby Oyster who ordered an MRI, but the patient could not tolerate it. The mass is not painful, but she is concerned because she feels it has been rapidly enlarging.    PE: AVSS  6 x 6 soft, mobile, subcutaneous mass left lateral thigh.    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 I have ordered a CT to provide more information. However, we will plan on surgery as an outpatient procedure under IV sedation or general anesthesia. Should the CT show concern for malignancy, then we will discuss the more aggressive approach necessary for that lesion. Otherwise we will plan on enucleation of the lipoma with a low risk of bleeding, infection, hematoma or seroma formation, or wound healing difficulties. She would like to be scheduled.

## 2015-03-22 ENCOUNTER — Other Ambulatory Visit: Payer: Self-pay | Admitting: Surgery

## 2015-03-29 ENCOUNTER — Encounter (INDEPENDENT_AMBULATORY_CARE_PROVIDER_SITE_OTHER): Payer: Self-pay | Admitting: Surgery

## 2015-04-14 ENCOUNTER — Ambulatory Visit: Payer: BC Managed Care – PPO

## 2015-04-17 NOTE — Pre-Procedure Instructions (Signed)
   No testing required per anesthesia guidelines   No testing ordered by surgeon   Ct pelvis 03/22/15 in chart = abn   Cbc,cmp,lipids,a1c,b12,ua,tsh,t4,hemolysis, gfr 11/22/14 in chart = wdl

## 2015-04-18 ENCOUNTER — Ambulatory Visit
Admission: RE | Admit: 2015-04-18 | Discharge: 2015-04-18 | Disposition: A | Discharge status: Home / Self Care | Payer: BC Managed Care – PPO | Source: Ambulatory Visit | Attending: Surgery | Admitting: Surgery

## 2015-04-18 ENCOUNTER — Ambulatory Visit: Payer: BC Managed Care – PPO | Admitting: Certified Registered"

## 2015-04-18 ENCOUNTER — Encounter
Admission: RE | Disposition: A | Discharge status: Home / Self Care | Payer: Self-pay | Source: Ambulatory Visit | Attending: Surgery

## 2015-04-18 ENCOUNTER — Other Ambulatory Visit: Payer: Self-pay

## 2015-04-18 ENCOUNTER — Ambulatory Visit: Payer: BC Managed Care – PPO | Admitting: Surgery

## 2015-04-18 DIAGNOSIS — D171 Benign lipomatous neoplasm of skin and subcutaneous tissue of trunk: Secondary | ICD-10-CM

## 2015-04-18 DIAGNOSIS — Z87891 Personal history of nicotine dependence: Secondary | ICD-10-CM | POA: Insufficient documentation

## 2015-04-18 DIAGNOSIS — Z85820 Personal history of malignant melanoma of skin: Secondary | ICD-10-CM | POA: Insufficient documentation

## 2015-04-18 DIAGNOSIS — D1724 Benign lipomatous neoplasm of skin and subcutaneous tissue of left leg: Secondary | ICD-10-CM | POA: Insufficient documentation

## 2015-04-18 DIAGNOSIS — E785 Hyperlipidemia, unspecified: Secondary | ICD-10-CM | POA: Insufficient documentation

## 2015-04-18 DIAGNOSIS — M799 Soft tissue disorder, unspecified: Secondary | ICD-10-CM

## 2015-04-18 HISTORY — PX: EXCISION, MASS: SHX3988

## 2015-04-18 HISTORY — DX: Low back pain, unspecified: M54.50

## 2015-04-18 SURGERY — EXCISION, MASS
Anesthesia: Anesthesia General | Site: Thigh | Laterality: Left | Wound class: Clean

## 2015-04-18 MED ORDER — ACETAMINOPHEN 500 MG PO TABS
ORAL_TABLET | ORAL | Status: AC
Start: 2015-04-18 — End: 2015-04-18
  Administered 2015-04-18: 1000 mg via ORAL
  Filled 2015-04-18: qty 2

## 2015-04-18 MED ORDER — MIDAZOLAM HCL 2 MG/2ML IJ SOLN
INTRAMUSCULAR | Status: AC
Start: 2015-04-18 — End: ?
  Filled 2015-04-18: qty 2

## 2015-04-18 MED ORDER — MIDAZOLAM HCL 2 MG/2ML IJ SOLN
INTRAMUSCULAR | Status: DC | PRN
Start: 2015-04-18 — End: 2015-04-18
  Administered 2015-04-18: 2 mg via INTRAVENOUS

## 2015-04-18 MED ORDER — LIDOCAINE-EPINEPHRINE 1 %-1:100000 IJ SOLN
INTRAMUSCULAR | Status: AC
Start: 2015-04-18 — End: ?
  Filled 2015-04-18: qty 20

## 2015-04-18 MED ORDER — PROPOFOL 10 MG/ML IV EMUL (WRAP)
INTRAVENOUS | Status: AC
Start: 2015-04-18 — End: ?
  Filled 2015-04-18: qty 20

## 2015-04-18 MED ORDER — DEXAMETHASONE SODIUM PHOSPHATE 4 MG/ML IJ SOLN (WRAP)
INTRAMUSCULAR | Status: DC | PRN
Start: 2015-04-18 — End: 2015-04-18
  Administered 2015-04-18: 8 mg via INTRAVENOUS

## 2015-04-18 MED ORDER — BUPIVACAINE-EPINEPHRINE (PF) 0.25% -1:200000 IJ SOLN
INTRAMUSCULAR | Status: AC
Start: 2015-04-18 — End: ?
  Filled 2015-04-18: qty 10

## 2015-04-18 MED ORDER — BACITRACIN ZINC 500 UNIT/GM EX OINT
TOPICAL_OINTMENT | CUTANEOUS | Status: AC
Start: 2015-04-18 — End: ?
  Filled 2015-04-18: qty 28.35

## 2015-04-18 MED ORDER — LIDOCAINE HCL 2 % IJ SOLN
INTRAMUSCULAR | Status: DC | PRN
Start: 2015-04-18 — End: 2015-04-18
  Administered 2015-04-18: 50 mg

## 2015-04-18 MED ORDER — HYDROMORPHONE HCL 1 MG/ML IJ SOLN
0.5000 mg | INTRAMUSCULAR | Status: DC | PRN
Start: 2015-04-18 — End: 2015-04-18

## 2015-04-18 MED ORDER — BUPIVACAINE-EPINEPHRINE (PF) 0.25% -1:200000 IJ SOLN
INTRAMUSCULAR | Status: DC | PRN
Start: 2015-04-18 — End: 2015-04-18
  Administered 2015-04-18: 3.5 mL
  Administered 2015-04-18: 2.5 mL

## 2015-04-18 MED ORDER — FENTANYL CITRATE (PF) 50 MCG/ML IJ SOLN (WRAP)
25.0000 ug | INTRAMUSCULAR | Status: DC | PRN
Start: 2015-04-18 — End: 2015-04-18
  Administered 2015-04-18: 25 ug via INTRAVENOUS

## 2015-04-18 MED ORDER — FENTANYL CITRATE (PF) 50 MCG/ML IJ SOLN (WRAP)
INTRAMUSCULAR | Status: AC
Start: 2015-04-18 — End: 2015-04-18
  Administered 2015-04-18: 25 ug via INTRAVENOUS
  Filled 2015-04-18: qty 2

## 2015-04-18 MED ORDER — PHENYLEPHRINE 100 MCG/ML IN NACL 0.9% IV SOSY
PREFILLED_SYRINGE | INTRAVENOUS | Status: AC
Start: 2015-04-18 — End: ?
  Filled 2015-04-18: qty 5

## 2015-04-18 MED ORDER — LACTATED RINGERS IV SOLN
INTRAVENOUS | Status: DC
Start: 2015-04-18 — End: 2015-04-18
  Administered 2015-04-18: 1000 mL via INTRAVENOUS

## 2015-04-18 MED ORDER — SODIUM CHLORIDE 0.9% BAG (IRRIGATION USE)
INTRAVENOUS | Status: DC | PRN
Start: 2015-04-18 — End: 2015-04-18
  Administered 2015-04-18: 1000 mL

## 2015-04-18 MED ORDER — ONDANSETRON HCL 4 MG/2ML IJ SOLN
4.0000 mg | Freq: Once | INTRAMUSCULAR | Status: DC | PRN
Start: 2015-04-18 — End: 2015-04-18

## 2015-04-18 MED ORDER — HYDROMORPHONE HCL 2 MG PO TABS
ORAL_TABLET | ORAL | Status: AC
Start: 2015-04-18 — End: 2015-04-18
  Administered 2015-04-18: 2 mg via ORAL
  Filled 2015-04-18: qty 1

## 2015-04-18 MED ORDER — PROPOFOL 10 MG/ML IV EMUL (WRAP)
INTRAVENOUS | Status: DC | PRN
Start: 2015-04-18 — End: 2015-04-18
  Administered 2015-04-18: 150 mg via INTRAVENOUS

## 2015-04-18 MED ORDER — FAMOTIDINE 20 MG/2ML IV SOLN
INTRAVENOUS | Status: DC
Start: 2015-04-18 — End: 2015-04-18
  Filled 2015-04-18: qty 2

## 2015-04-18 MED ORDER — GABAPENTIN 100 MG PO CAPS
200.0000 mg | ORAL_CAPSULE | Freq: Once | ORAL | Status: DC
Start: 2015-04-18 — End: 2015-04-18

## 2015-04-18 MED ORDER — HYDROMORPHONE HCL 2 MG PO TABS
2.0000 mg | ORAL_TABLET | Freq: Once | ORAL | Status: AC | PRN
Start: 2015-04-18 — End: 2015-04-18

## 2015-04-18 MED ORDER — LACTATED RINGERS IV SOLN
100.0000 mL/h | INTRAVENOUS | Status: DC
Start: 2015-04-18 — End: 2015-04-18
  Administered 2015-04-18: 500 mL via INTRAVENOUS

## 2015-04-18 MED ORDER — ACETAMINOPHEN 500 MG PO TABS
1000.0000 mg | ORAL_TABLET | Freq: Three times a day (TID) | ORAL | Status: AC
Start: 2015-04-18 — End: 2015-04-20

## 2015-04-18 MED ORDER — EPHEDRINE SULFATE 50 MG/ML IJ SOLN
INTRAMUSCULAR | Status: AC
Start: 2015-04-18 — End: ?
  Filled 2015-04-18: qty 1

## 2015-04-18 MED ORDER — FENTANYL CITRATE (PF) 50 MCG/ML IJ SOLN (WRAP)
INTRAMUSCULAR | Status: AC
Start: 2015-04-18 — End: ?
  Filled 2015-04-18: qty 2

## 2015-04-18 MED ORDER — ACETAMINOPHEN 500 MG PO TABS
1000.0000 mg | ORAL_TABLET | Freq: Once | ORAL | Status: DC
Start: 2015-04-18 — End: 2015-04-18

## 2015-04-18 MED ORDER — LIDOCAINE HCL (PF) 2 % IJ SOLN
INTRAMUSCULAR | Status: AC
Start: 2015-04-18 — End: ?
  Filled 2015-04-18: qty 5

## 2015-04-18 MED ORDER — PROMETHAZINE HCL 25 MG/ML IJ SOLN
6.2500 mg | Freq: Once | INTRAMUSCULAR | Status: DC | PRN
Start: 2015-04-18 — End: 2015-04-18

## 2015-04-18 MED ORDER — DEXAMETHASONE SODIUM PHOSPHATE 20 MG/5ML IJ SOLN
INTRAMUSCULAR | Status: AC
Start: 2015-04-18 — End: ?
  Filled 2015-04-18: qty 5

## 2015-04-18 MED ORDER — LIDOCAINE-EPINEPHRINE 1 %-1:100000 IJ SOLN
INTRAMUSCULAR | Status: DC | PRN
Start: 2015-04-18 — End: 2015-04-18
  Administered 2015-04-18: 3.5 mL
  Administered 2015-04-18: 2.5 mL

## 2015-04-18 MED ORDER — FAMOTIDINE 10 MG/ML IV SOLN (WRAP)
20.0000 mg | Freq: Once | INTRAVENOUS | Status: AC
Start: 2015-04-18 — End: 2015-04-18
  Administered 2015-04-18: 20 mg via INTRAVENOUS

## 2015-04-18 MED ORDER — FENTANYL CITRATE (PF) 50 MCG/ML IJ SOLN (WRAP)
INTRAMUSCULAR | Status: DC | PRN
Start: 2015-04-18 — End: 2015-04-18
  Administered 2015-04-18 (×4): 25 ug via INTRAVENOUS

## 2015-04-18 MED ORDER — ONDANSETRON HCL 4 MG/2ML IJ SOLN
INTRAMUSCULAR | Status: AC
Start: 2015-04-18 — End: ?
  Filled 2015-04-18: qty 2

## 2015-04-18 MED ORDER — ONDANSETRON HCL 4 MG/2ML IJ SOLN
INTRAMUSCULAR | Status: DC | PRN
Start: 2015-04-18 — End: 2015-04-18
  Administered 2015-04-18: 4 mg via INTRAVENOUS

## 2015-04-18 MED ORDER — OXYCODONE HCL 5 MG PO TABS
5.0000 mg | ORAL_TABLET | ORAL | Status: DC | PRN
Start: 2015-04-18 — End: 2015-04-18

## 2015-04-18 MED ORDER — ACETAMINOPHEN 500 MG PO TABS
1000.0000 mg | ORAL_TABLET | Freq: Once | ORAL | Status: AC
Start: 2015-04-18 — End: 2015-04-18

## 2015-04-18 MED ORDER — GABAPENTIN 300 MG PO CAPS
ORAL_CAPSULE | ORAL | Status: AC
Start: 2015-04-18 — End: 2015-04-18
  Administered 2015-04-18: 300 mg via ORAL
  Filled 2015-04-18: qty 1

## 2015-04-18 MED ORDER — GLYCOPYRROLATE 0.2 MG/ML IJ SOLN
INTRAMUSCULAR | Status: AC
Start: 2015-04-18 — End: ?
  Filled 2015-04-18: qty 1

## 2015-04-18 MED ORDER — PHENYLEPHRINE 100 MCG/ML IN NACL 0.9% IV SOSY
PREFILLED_SYRINGE | INTRAVENOUS | Status: DC | PRN
Start: 2015-04-18 — End: 2015-04-18
  Administered 2015-04-18: 50 ug via INTRAVENOUS
  Administered 2015-04-18 (×2): 100 ug via INTRAVENOUS

## 2015-04-18 MED ORDER — GABAPENTIN 100 MG PO CAPS
200.0000 mg | ORAL_CAPSULE | Freq: Three times a day (TID) | ORAL | 0 refills | Status: AC
Start: 2015-04-18 — End: 2015-04-20
  Filled 2015-04-18: qty 12, 2d supply, fill #0

## 2015-04-18 MED ORDER — EPHEDRINE SULFATE 50 MG/ML IJ SOLN
INTRAMUSCULAR | Status: DC | PRN
Start: 2015-04-18 — End: 2015-04-18
  Administered 2015-04-18: 10 mg via INTRAVENOUS

## 2015-04-18 MED ORDER — GABAPENTIN 300 MG PO CAPS
300.0000 mg | ORAL_CAPSULE | Freq: Three times a day (TID) | ORAL | Status: DC
Start: 2015-04-18 — End: 2015-04-18

## 2015-04-18 SURGICAL SUPPLY — 43 items
ADHESIVE SKIN CLOSURE DERMABOND ADVANCED (Skin Closure) ×1
ADHESIVE SKIN CLOSURE DERMABOND ADVANCED .7 ML LIQUID APPLICATOR (Skin Closure) ×1 IMPLANT
ADHESIVE SKNCLS 2 OCTYL CYNCRLT .7ML (Skin Closure) ×1
APPLCATOR CHLORAPREP 26ML (Prep) ×2 IMPLANT
CLIP INTERNAL MEDIUM CHEVRON 6 CARTRIDGE (Clips) IMPLANT
CLIP INTERNAL MEDIUM CHEVRON 6 CARTRIDGE LIGATE TRIANGULATE CROSS (Clips) IMPLANT
CLIP INTERNAL SMALL CHEVRON 6 CARTRIDGE (Clips)
CLIP INTERNAL SMALL CHEVRON 6 CARTRIDGE LIGATE TRIANGULATE CROSS (Clips) IMPLANT
CLIP TITANIUM LIGATING MED (Clips)
CLIP WECK HRZN INTNL SM CHEVRON TI 6 (Clips)
CONTAINER SPEC 8OZ NS SNPON LID TRNLU (Suction) ×2 IMPLANT
COVER TRANSDUCER L244 CM X W15.2 CM (Drape) IMPLANT
COVER TRANSDUCER L244 CM X W15.2 CM TELESCOPIC FOLD COLOR ELASTIC BAND (Drape) IMPLANT
COVER TRNDCR POLY SRGBT 244X15.2CM LF (Drape)
DRAPE CARDIOVASCULAR SURGICAL SMS 84IN 38IN (Drape) IMPLANT
DRAPE SRG SMS 84IN 38IN CVARTS 100X60IN (Drape)
GLOVE SURG BIOGEL INDIC SZ 8.5 (Glove) ×2 IMPLANT
GLOVE SURG BIOGEL SZ 9.0 GREEN (Glove) ×2 IMPLANT
GOWN SRG PE 3XL AURR 49IN LF STRL LVL 4 (Gown) ×1
GOWN SURGICAL 3XL L49 IN POLYETHYLENE (Gown) ×1
GOWN SURGICAL 3XL L49 IN POLYETHYLENE BLUE LEVEL 4 IMPERVIOUS (Gown) ×1 IMPLANT
KIT MAJOR FFX (Kits) ×2 IMPLANT
NEEDLE ELECTRODE (Cautery) IMPLANT
PAD ELECTROSRG GRND REM W CRD (Procedure Accessories) ×2 IMPLANT
SLEEVE CMPR MED KN LGTH KDL SCD 21- IN (Sleeve) ×2
SLEEVE COMPRESSION MEDIUM KNEE LENGTH KENDALL SEQUENTIAL OD21- IN (Sleeve) ×1 IMPLANT
SPONGE GAUZE L6 3/4 IN X W6 IN MEDIUM (Dressing) ×1
SPONGE GAUZE L6 3/4 IN X W6 IN MEDIUM ABSORBENT FLUFF DRY CRINKLE (Dressing) ×1 IMPLANT
SPONGE GZE CTTN MED KRLX 6.75X6IN LF (Dressing) ×1
SPONGE LAP 18X18IN PREWASH WHT (Sponge)
SPONGE LAPAROTOMY L18 IN X W18 IN (Sponge)
SPONGE LAPAROTOMY L18 IN X W18 IN PREWASH WHITE (Sponge) IMPLANT
SUTURE ABS 3-0 SH VCL 27IN BRD COAT UD (Suture)
SUTURE COATED VICRYL 3-0 SH L27 IN BRAID (Suture) IMPLANT
SUTURE MONOCRYL 4-0 PS2 27IN (Suture) IMPLANT
SUTURE NABSB SLK 2-0 PS PRMHND 18IN BRD (Suture) ×1
SUTURE SILK PERMA HAND BLACK 2-0 PS L18 (Suture) ×1
SUTURE SILK PERMA HAND BLACK 2-0 PS L18 IN BRAID NONABSORBABLE (Suture) ×1 IMPLANT
SUTURE VICRYL 3-0 PS2 27IN (Suture) IMPLANT
TAPE MICROPORE SURGICAL 3INX10YDS (Tape) ×1
TAPE SURGICAL L10 YD X W3 IN STANDARD (Tape) ×1
TAPE SURGICAL L10 YD X W3 IN STANDARD HYPOALLERGENIC BREATHABLE (Tape) ×1 IMPLANT
TOWEL STERILE REUSABLE 8PK (Procedure Accessories) ×2 IMPLANT

## 2015-04-18 NOTE — Discharge Instructions (Addendum)
Surgical Discharge Instructions:    Procedure Date:  04/18/2015  Procedure:  EXCISION, MASS  Surgeon: Lorenza Chick, MD    Wound Care:  Keep clean and dry.     Pain Control:  Some pain and swelling is normal after surgery.   You may ice your incision with an ice pack wrapped in a towel for 20 min as needed.    Many people find that Nonsteroidal Antiinflammatory drugs will help relieve their discomfort very well, without the narcotic side effects, especially if they are taken routinely for the first few days after surgery.  You may choose to take:   Ibuprofen (Advil, Motrin): 600-800mg  (3-4 of the over-the-counter pills) every 8 hours around-the-clock, with some food)   Acetaminophen (Tylenol): 650mg -1gm every 6 hours   Please check with your physician before using these medications if you have chronic liver or kidney diseases     You may take the narcotic pain medication as prescribed if your pain is not relieved by the medications above.       Activity:   activity as tolerated     You may walk without restrictions after leaving the hospital.  Bedrest is not encouraged; you should be walking a few times a day at least.  You may climb stairs.  After a week, resuming aerobic activity slowly is okay.  Do not strain to lift.  After 3-4 weeks exercises that include lifting and straining may be slowly resumed.  If an exercise hurts, then stop.  It is common to have pain at the incision site with activity for a few weeks after surgery.    You may resume work when you feel comfortable to do so. Most people will be able to begin working again in 5-7 days, unless they have a very strenuous job.    Driving:  You may drive when you are no longer taking narcotic pain medications.    Bathing:    You may shower.    Diet: regular diet  You may not feel hungry all them time or you may eat a small amount and feel full.  This is ok.  Some people find eating multiple small meals is easier than 3 large meals.  Continue to drink lots  of clear fluids.    Over the Counter Medications:  You may take an over the counter stool softener while you are taking narcotic pain medications.     Reasons to call the doctor:  Call your doctor or go to emergency room if you have any of the following:   Fever greater than 101.5 F   Pus draining from your wound   Redness around your wound that is spreading   Pain not controlled with medications   Chest pain   Shortness of breath   Nausea/vomiting       Discharge Instructions: After Your Surgery  You've just had surgery. During surgery you were given medicine called anesthesia to keep you relaxed and free of pain. After surgery you may have some pain or nausea. This is common. Here are some tips for feeling better and getting well after surgery.    Going home  Your doctor or nurse will show you how to take care of yourself when you go home. He or she will also answer your questions. Have an adult family member or friend drive you home. For the first 24 hours after your surgery:   Do not drive or use heavy equipment.   Do not make important decisions  or sign legal papers.   Do not drink alcohol.   Have someone stay with you, if needed. He or she can watch for problems and help keep you safe.  Be sure to go to all follow-up visits with your doctor. And rest after your surgery for as long as your doctor tells you to.  Coping with pain  If you have pain after surgery, pain medicine will help you feel better. Take it as told, before pain becomes severe. Also, ask your doctor or pharmacist about other ways to control pain. This might be with heat, ice, or relaxation. And follow any other instructions your surgeon or nurse gives you.  Tips for taking pain medicine  To get the best relief possible, remember these points:   Pain medicines can upset your stomach. Taking them with a little food may help.   Most pain relievers taken by mouth need at least 20 to 30 minutes to start to work.   Taking medicine on a  schedule can help you remember to take it. Try to time your medicine so that you can take it before starting an activity. This might be before you get dressed, go for a walk, or sit down for dinner.   Constipation is a common side effect of pain medicines. Call your doctor before taking any medicines such as laxatives or stool softeners to help ease constipation. Also ask if you should skip any foods. Drinkinglots of fluids andeating foodssuch as fruits and vegetables that are high in fiber can also help. Remember, do not take laxatives unless your surgeon has prescribed them.   Drinking alcohol and taking pain medicine can cause dizziness and slow your breathing. It can even be deadly. Do not drink alcohol while taking pain medicine.   Pain medicine can make you react more slowly to things. Do not drive or run machinery while taking pain medicine.  Your health care providermay tell you to take acetaminophen to help ease your pain. Ask him or her how much you are supposed to take each day. Acetaminophen or other pain relievers may interact with your prescription medicines or other over-the-counter (OTC) drugs. Some prescription medicines have acetaminophen and other ingredients.Using both prescription and OTC acetaminophenfor paincan cause you to overdose. Readthe labels on your OTC medicineswith care. This will help youto clearly know the list of ingredients, how much to take, and anywarnings. It may also help you not take too muchacetaminophen.If you have questions or do not understand the information, ask your pharmacist or health care provider to explain it to you before you take the OTC medicine.  Managing nausea  Some people have an upset stomach after surgery. This is often because of anesthesia, pain, or pain medicine, or the stress of surgery. These tips will help you handle nausea and eat healthy foods as you get better. If you were on a special food plan before surgery, ask your doctor if  you should follow it while you get better. These tips may help:   Do not push yourself to eat. Your body will tell you when to eat and how much.   Start off with clear liquids and soup. They are easier to digest.   Next try semi-solid foods, such as mashed potatoes, applesauce, and gelatin, as you feel ready.   Slowly move to solid foods. Don't eat fatty, rich, or spicy foods at first.   Do not force yourself to have 3 large meals a day. Instead eat smaller amounts more often.  Take pain medicines with a small amount of solid food, such as crackers or toast, to avoid nausea.         If you have obstructive sleep apnea  You were given anesthesia medicine during surgery to keep you comfortable and free of pain. After surgery, you may have more apnea spells because of this medicine and other medicines you were given. The spells may last longer than usual.  At home:   Keep using the continuous positive airway pressure (CPAP) device when you sleep. Unless your health care provider tells you not to, use it when you sleep, day or night. CPAP is a common device used to treat obstructive sleep apnea.   Talk with your provider before taking any pain medicine, muscle relaxants, or sedatives. Your provider will tell you about the possible dangers of taking these medicines.   [address] [employer], [place] [address]. All rights reserved. This information is not intended as a substitute for professional medical care. Always follow your healthcare professional's instructions.

## 2015-04-18 NOTE — Anesthesia Postprocedure Evaluation (Signed)
Anesthesia Post Evaluation    Patient: [name] [name]    Procedure(s) with comments:  EXCISION, MASS - LEFT THIGH MASS EXCISION    Anesthesia type: general    Last Vitals:   Filed Vitals:    04/18/15 1620   BP: 134/62   Pulse: 80   Temp: 36.8 C (98.2 F)   Resp: 16   SpO2: 97%       Patient Location: Phase I PACU      Post Pain: Patient not complaining of pain, continue current therapy    Mental Status: awake and alert    Respiratory Function: tolerating room air    Cardiovascular: stable    Nausea/Vomiting: patient not complaining of nausea or vomiting    Hydration Status: adequate    Post Assessment: no apparent anesthetic complications, no evidence of recall and no reportable events          Anesthesia Qualified Clinical Data Registry    Central Line      CVC insertion : NO                                               Perioperative temperature management      General/neuraxial anesthesia > or = 60 minutes (excluding CABG) : YES              > Use of intraoperative active warming : YES              > Temperature > or = 36 degrees Centigrade (96.8 degrees Farenheit) during time span from 30 minutes before up to 15 minutes after anesthesia end time : YES      Administration of antibiotic prophylaxis      Age > or = 18, with IV access, with surgical procedure for which antibiotic prophylaxis indicated, and not on chronic antibiotics : YES              > Prophylactic antibiotics within 1 hour of incision (or fluroroquinolone/vancomycin within 2 hours of incision) : YES    Medication Administration      Ordering or administration of drug inconsistent with intended drug, dose, delivery or timing : NO      Dental loss/damage      Dental injury with administration of anesthesia : NO      Difficult intubation due to unrecognized difficult airway        Elective airway procedure including but not limited to: tracheostomy, fiberoptic bronchoscopy, rigid bronchoscopy; jet ventilation; or elective use of a device to  facilitate airway management such as a Glidescope : NO                > Unanticipated difficult intubation post pre-evaluation : NO      Aspiration of gastric contents        Aspiration of gastric contents : NO                    Surgical fire        Procedure requiring electrocautery/laser : NO                    Immediate perioperative cardiac arrest        Cardiac arrest in OR or PACU : NO  Unplanned hospital admission        Unplanned hospital admission for initially intended outpatient anesthesia service : NO      Unplanned ICU admission        Unplanned ICU admission related to anesthesia occurring within 24 hours of induction or start of MAC : NO      Surgical case cancellation        Cancellation of procedure after care already started by anesthesia care team : NO      Post-anesthesia transfer of care checklist/protocol to PACU        Transfer from OR to PACU upon case conclusion : YES              > Use of PACU transfer checklist/protocol : YES     (Includes the key elements of: patient identification, responsible practitioner identification (PACU nurse or advanced practitioner), discussion of pertinent history and procedure course, intraoperative anesthetic management, post-procedure plans, acknowledgement/questions)    Post-anesthesia transfer of care checklist/protocol to ICU        Transfer from OR to ICU upon case conclusion : NO                    Post-operative nausea/vomiting risk protocol        Post-operative nausea/vomiting risk protocol : YES  Patient > or = 18 with care initiated by anesthesia team that has a risk factor screen for post-op nausea/vomiting (Includes female, hx PONV, or motion sickness, non-smoker, intended opioid administration for post-op analgesia.)    Anaphylaxis        Anaphylaxis during anesthesia services : NO    (Inclusive of any suspected transfusion reaction in association with blood-bank confirmed blood product incompatibility)              Florestine Avers, 04/18/2015 4:42 PM

## 2015-04-18 NOTE — Op Note (Signed)
Procedure Date: 04/18/2015     Patient Type: A     SURGEON: Lorenza Chick MD  ASSISTANT:       FIRST ASSISTANT:    Culbreath       PREOPERATIVE DIAGNOSIS:  Lipoma, left thigh.     POSTOPERATIVE DIAGNOSIS:  Lipoma, left thigh.     TITLE OF PROCEDURE:  Excision of left thigh lipoma.     INDICATIONS:  This 59 year old female with a prior history of melanoma of the back,  presented with an enlarging mass over her left upper thigh and hip.   Although clinically I felt this was most consistent with a benign lipoma,  its apparent rapid enlargement over a 2 month period raised the possibility  that this could be a liposarcoma.  An MRI was ordered, but the patient  could not tolerate it.  She underwent a CT scan which showed a subcutaneous  fatty prominence at the left lateral proximal thigh, corresponding to the  area of the palpable lump.  No discrete enhancing soft tissue component was  identified and it was felt that this reflected a subcutaneous lipoma, which  correlated with our clinical diagnosis.  She was therefore brought to the  operating room in order to have this removed.     FINDINGS:  Subcutaneous fatty tissue measuring approximately 10 x 6 x 4-cm.     DESCRIPTION OF PROCEDURE:  The patient was prepped and draped in the usual sterile fashion.  A  surgical pause was performed.  Antibiotics were not indicated.  She had  SCDs for DVT prophylaxis.  An incision was made in the skin line after  infiltrating with local anesthesia.  The dissection continued into the  subcutaneous fat.  Although there was clearly a palpable mass, there was no  discrete capsule identified.  We therefore excised the prominent  subcutaneous fat down to underlying muscle and this was submitted as  lipoma.  After ensuring satisfactory hemostasis, the area was irrigated and  closed with interrupted 3-0 Vicryl followed by 4-0 Monocryl in a  subcuticular closure.  Dermabond was placed.  Instrument and sponge counts  were reported as correct.   The patient tolerated the procedure well and was  taken to the recovery room in stable condition.           D:  04/18/2015 14:56 PM by Dr. Lorenza Chick, MD [phone])  T:  04/18/2015 19:01 PM by [id number]      Everlean Cherry: [id number]) (Doc ID: [id number])

## 2015-04-18 NOTE — Brief Op Note (Signed)
BRIEF OP NOTE    Date Time: 04/18/2015 3:15 PM    Patient Name:   Neka Bise    Date of Operation:   04/18/2015    Providers Performing:   Surgeon(s) and Role:     * Lorenza Chick, MD - Primary     * Rosaland Lao, Frederik Schmidt, MD - 1st  Assistant    Operative Procedure:   Procedure(s) with comments:  EXCISION, MASS - LEFT THIGH MASS EXCISION    Preoperative Diagnosis:   Pre-Op Diagnosis Codes:     * Mass of soft tissue [M79.9]    Postoperative Diagnosis:   Same as pre-operative    Anesthesia:   General    Fluids (I/O):   EBL:  Minimal  UOP: No foley  Crystalloid: 800 LR  Colloid: None  Blood Products: None    Implants:   * No implants in log *    Drains:   None    Specimens:        SPECIMENS (last 24 hours)      Pathology Specimens       04/18/15 1400             Specimen Information    Specimen Testing Required Routine Pathology       Specimen ID  [id number]       Specimen Description Lipoma left hip           Findings:   Upper left lateral thigh subcutaneous fat removal 10x6x4cm    Wound Class:   Clean    Complications:   None    Gilford Silvius, MD  General Surgery Resident, PGY-1  Pager [phone]  [facility] OR

## 2015-04-18 NOTE — H&P (Signed)
Surgical History and Physical    [name] is a 59yoF who presents to [facility] for elective surgery to remove a subcutaneous mass on the upper left thigh. Please see Dr. Talmage Nap original clinic note pasted below. Her medical history has not changed since last visit.    Filed Vitals:    04/18/15 1107   BP: 154/67   Pulse: 67   Temp: 98.1 F (36.7 C)   Resp: 16   SpO2: 100%     Physical Exam  Chest:  Non-labored breathing  Heart: RRR  Abdomen: Soft, nontender  Extremity: left upper thigh soft tissue mass marked "yes" by patient and "x GH" by Dr. Willeen Cass    Plan:  -Consent reviewed and signed  -Proceed to OR for elective removal of upper left thigh mass  -Neurontin 300 and tylenol 1g    Gilford Silvius, MD  General Surgery Resident, PGY-1  Pager [phone]        [name] [name] became aware of a soft subcutaneous mass on her left upper thigh about two months ago. She was told that an [place] would not be useful by a radiologist for this. She saw Dr. Crosby Oyster who ordered an MRI, but the patient could not tolerate it. The mass is not painful, but she is concerned because she feels it has been rapidly enlarging.    PE: AVSS  6 x 6 soft, mobile, subcutaneous mass left lateral thigh.    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 I have ordered a CT to provide more information. However, we will plan on surgery as an outpatient procedure under IV sedation or general anesthesia. Should the CT show concern for malignancy, then we will discuss the more aggressive approach necessary for that lesion. Otherwise we will plan on enucleation of the lipoma with a low risk of bleeding, infection, hematoma or seroma formation, or wound healing difficulties. She would like to be scheduled.     Dr. Willeen Cass

## 2015-04-18 NOTE — Transfer of Care (Signed)
Anesthesia Transfer of Care Note    Patient: [name] [name]    Procedures performed: Procedure(s) with comments:  EXCISION, MASS - LEFT THIGH MASS EXCISION    Anesthesia type: General LMA    Patient location:Phase I PACU    Last vitals:   Filed Vitals:    04/18/15 1514   BP:    Pulse: 83   Temp:    Resp: 14   SpO2: 98%       Post pain: Patient not complaining of pain, continue current therapy      Mental Status:awake    Respiratory Function: tolerating room air    Cardiovascular: stable    Nausea/Vomiting: patient not complaining of nausea or vomiting    Hydration Status: adequate    Post assessment: no apparent anesthetic complications

## 2015-04-18 NOTE — Anesthesia Preprocedure Evaluation (Addendum)
Anesthesia Evaluation    AIRWAY    Mallampati: II    TM distance: >3 FB  Neck ROM: full  Mouth Opening:full   CARDIOVASCULAR    cardiovascular exam normal       DENTAL    no notable dental hx     PULMONARY    pulmonary exam normal     OTHER FINDINGS                      Anesthesia Plan    ASA 2     general                     intravenous induction   Detailed anesthesia plan: general LMA      Post Op: post-op ventilation  Post op pain management: per surgeon    informed consent obtained      pertinent labs reviewed

## 2015-04-19 ENCOUNTER — Encounter: Payer: Self-pay | Admitting: Surgery

## 2015-04-20 ENCOUNTER — Encounter (INDEPENDENT_AMBULATORY_CARE_PROVIDER_SITE_OTHER): Payer: Self-pay | Admitting: Hematology & Oncology

## 2015-04-20 ENCOUNTER — Encounter (INDEPENDENT_AMBULATORY_CARE_PROVIDER_SITE_OTHER): Payer: Self-pay | Admitting: Dermatology

## 2015-04-20 ENCOUNTER — Ambulatory Visit (INDEPENDENT_AMBULATORY_CARE_PROVIDER_SITE_OTHER): Payer: BC Managed Care – PPO | Admitting: Hematology & Oncology

## 2015-04-20 ENCOUNTER — Ambulatory Visit (INDEPENDENT_AMBULATORY_CARE_PROVIDER_SITE_OTHER): Payer: BC Managed Care – PPO | Admitting: Dermatology

## 2015-04-20 VITALS — BP 133/77 | Temp 98.4°F | Ht 62.0 in | Wt 130.8 lb

## 2015-04-20 VITALS — BP 133/77 | HR 81 | Temp 98.4°F | Ht 62.0 in | Wt 130.8 lb

## 2015-04-20 DIAGNOSIS — C4362 Malignant melanoma of left upper limb, including shoulder: Secondary | ICD-10-CM

## 2015-04-20 DIAGNOSIS — D229 Melanocytic nevi, unspecified: Secondary | ICD-10-CM

## 2015-04-20 LAB — LAB USE ONLY - HISTORICAL SURGICAL PATHOLOGY

## 2015-04-20 NOTE — Progress Notes (Signed)
[facility]  [facility]     Chief Complaint   Patient presents with   . skin exam       HISTORY OF PRESENT ILLNESS  Interval: had lipoma like lesion large diameter on the left thigh and painful, had U/S in Aug that suggested likely lipoma but advised MRI or excision. She was claustrophobic, did not have MRI and instead had CT which again showed a mass that is likely lipoma. This mass was excised 2 days ago and path is pending     Detailed:  [name] [name] is 59 y.o. female with a history of Stage 1B Left posterior shoulder.Patient noted a growing mole which became like a "pinkish/purpleish blister"  over the past 5 months, + bleeding. Saw her dermatologist Dr. Christella Noa who recommended excisional biopsy which was performed by Dr. Webb Silversmith on 04/06/14. Pathology showed 1.47 mm thick, non-ulcerated melanoma with 2 mitoses/mm2. Peripheral margin was close with melanoma in situ. She underwent wide local excision and sentinel lymph node biopsy on 05/19/14 which showed no residual melanoma at primary site and two negative left supraclavicular lymph nodes. Pre op CXR and LDH were normal.    No personal/family history of skin cancer. No tanning bed use. No blistering sunburns.      Current Outpatient Prescriptions   Medication Sig Dispense Refill   . acetaminophen (TYLENOL) 500 MG tablet Take 2 tablets (1,000 mg total) by mouth every 8 (eight) hours. 12 tablet 0   . ACETAMINOPHEN ER PO Take by mouth as needed.     . Ascorbic Acid (VITAMIN C) 500 MG tablet Take 500 mg by mouth daily.     Marland Kitchen aspirin EC 81 MG EC tablet Take 81 mg by mouth daily.     Marland Kitchen atorvastatin (LIPITOR) 10 MG tablet Take 1 tablet (10 mg total) by mouth daily. 90 tablet 1   . calcium & magnesium carbonates (MYLANTA) 311-232 MG per tablet Take 1 tablet by mouth daily.     . Cholecalciferol (VITAMIN D-3) 5000 UNITS TABS Take by mouth.     . gabapentin (NEURONTIN) 100 MG capsule Take 2 capsules (200 mg  total) by mouth 3 (three) times daily. 12 capsule 0   . loratadine (CLARITIN) 10 MG tablet Take 10 mg by mouth as needed.        . Melatonin 5 MG Tab Take 10 mg by mouth as needed.     . Omega-3 Fatty Acids (FISH OIL) 1200 MG CAPS Take 1,200 mg by mouth 2 (two) times daily.     . vitamin B-12 (CYANOCOBALAMIN) 1000 MCG tablet Take 1,000 mcg by mouth once a week.       No current facility-administered medications for this visit.     Allergies   Allergen Reactions   . Ciprofloxacin Swelling     Past Medical History   Diagnosis Date   . Seasonal allergies    . Hyperlipidemia    . 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 Lipoma      base of posterior neck   . Low back pain      Past Surgical History   Procedure Laterality Date   . Lasik  2003   . Excision, melanoma Bilateral 05/19/2014     Procedure: EXCISION, MELANOMA;  Surgeon: Lorenza Chick, MD;  Location: [facility] OR;  Service: General;  Laterality: Bilateral;  LEFT SHOULDER MELANOMA WIDE LOCAL EXCISION, RIGHT MID-BACK  ATYPICAL NEVUS EXCISION     . Biopsy, sentinel node N/A 05/19/2014     Procedure: BIOPSY, SENTINEL NODE;  Surgeon: Lorenza Chick, MD;  Location: [facility] OR;  Service: General;  Laterality: N/A;   SENTINEL LYMPH NODE BIOSPY W/ NUC MED @ 8:00AM,    . Excision, lipoma N/A 11/24/2014     Procedure: EXCISION, LIPOMA OF BASE OF NECK;  Surgeon: Lorenza Chick, MD;  Location: [facility];  Service: General;  Laterality: N/A;  BACK LIPOMA EXCISION   . Excision, mass Left 04/18/2015     Procedure: EXCISION, MASS;  Surgeon: Lorenza Chick, MD;  Location: [facility] OR;  Service: General;  Laterality: Left;  LEFT THIGH MASS EXCISION     Family History   Problem Relation Age of Onset   . Breast cancer Maternal Grandmother    . Cancer Maternal Grandmother      breast   . Hypertension Father    . Hyperlipidemia Father    . Aneurysm Father    . Heart disease Father      unsure of age   . Thyroid disease Sister    . Asthma Brother     . Hyperlipidemia Brother    . ADD / ADHD Daughter    . Asthma Daughter    . Alcohol abuse Maternal Uncle    . Alcohol abuse Maternal Grandfather      Social History     Social History   . Marital Status: Single     Spouse Name: N/A   . Number of Children: N/A   . Years of Education: N/A     Occupational History   . Not on file.     Social History Main Topics   . Smoking status: Former Smoker -- 1.00 packs/day for 35 years     Types: Cigarettes     Quit date: 10/29/2002   . Smokeless tobacco: Never Used   . Alcohol Use: 0.0 oz/week      Comment: drinks wine nightly (2 glasses); very rare other alcohol   . Drug Use: No   . Sexual Activity:     Partners: Male     Birth Control/ Protection: None     Other Topics Concern   . Not on file     Social History Narrative       REVIEW OF SYSTEMS  Const - no fever no chills  =  Integ - no lesions of concern     Objective:     Filed Vitals:    04/20/15 1114   BP: 133/77   Pulse: 81   Temp: 98.4 F (36.9 C)   SpO2: 98%      Constitutional: General appearance: NAD, conversant    HEENT: normocephalic, atraumatic. Conjunctiva, lids, lips, teeth, gums and oropharynx examined with no suspicious lesions    SKIN:A total body skin examination is completed including:   - palpation of scalp and inspection of hair of scalp, eyebrows, face, chest and extremities   - inspection of eccrine and apocrine glands.   - inspection and/or palpation of the skin and subcutaneous tissues with the following anatomic skin sites examined with the following findings:   1. Head including face - no lesions of concern  2. Neck  - no lesions of concern  3. Chest including breasts, and axillae - no lesions of concern  4. Abdomen - no lesions of concern  5. Genitalia, groin, buttocks - no lesions of concern  6. Back -  a. A. Posterior base of neck-healed scar (excised lipoma site)  7. Right upper extremity - no lesions of concern  8. Left upper extremity -   a. posterior shoulder 6 cm well healed scar (MM  site 10/15) no pigmentation or nodulatrity  9. Right lower extremity - no lesions of concern  10. Left lower extremity -   a. Left outer thigh - transverse scar 10cm      - Dermatoscopy was used to evaluate the nevi.   - General skin: multiple brown macules, size 2-7mm, approximately 50 in number, distributed primarily on the trunk. Extensive photodamage and rhytids    LYMPHATIC:Examination of lymph node basins is completed, including the H and N, axilla and groin. There is no lymphadenopathy.    NECK: supple, no masses    CVS: no swelling, varicosities or edema    EXTREMITIES: inspection and palpation of digits and nails revealed no abnormalities    GI:  External anus without suspicious lesions    NEURO/PSYCH: alert and oriented, pleasant      Radiologic Studies None    Pathology reports   Date 04/06/14  Accession Number [id number])  Location Lt Post Shoulder  Dx MM   Clark Level IV   Thickness 1.31mm   Mitotic Rate 2 per mm   Ulceration Absent   Regression Absent       Assessment & Plan:   59 y.o. female with a history of Stage 1B Left posterior shoulder 04/06/14 s/p WLE and negative SLN     1. Melanoma: Stage 1B Left posterior shoulder 03/2014  - no evidence of recurrence    2. Dysplastic nevus syndrome, small congenital nevus right outer thigh   None worrisome    3. Safe sun practices reviewed including:  1. Use of sunscreen and application, need for reapplication  2. Discussed exposure through car windows and tinting  3. Sun protective clothing including hats, sunglasses and other      4. Mass excised 2 days ago left thigh path is pending    5. RTC 4 months         Ein Rijo S. Ruel Favors, MD, FAAD  Medical Director, [facility]  [facility]  [phone] (t)   [phone] (f)

## 2015-04-20 NOTE — Progress Notes (Signed)
[facility]    Chief Complaint   Patient presents with   . Follow-up     HISTORY OF PRESENT ILLNESS  [name] [name] is 59 y.o. female who noted a growing mole in left shoulder which became like "blister" over 5 months. Also noted bleeding from the lesion. Saw her dermatologist Dr. Christella Noa who recommended excisional biopsy which was performed by Dr. Webb Silversmith on 04/06/14. Pathology showed 1.47 mm thick, non-ulcerated melanoma with 2 mitoses/mm2. Peripheral margin was close with melanoma in situ. Preop CXR and LDH were normal. She underwent wide local excision and sentinel lymph node biopsy on 05/19/14 which showed no residual melanoma at primary site and two negative left supraclavicular lymph nodes.    She's here for surveillance. She noticed new soft lump in left hip area in August 2016 for which ultrasound showed features most consistent with lipoma. She underwent excision by Dr.Hafner on 04/18/15. Pathology is still pending.  No palpable lymph node. Patient denies any rash/itching, fatigue, weight loss, headache, cough, shortness of breath, nausea, vomiting, diarrhea or abdominal pain.       Current Outpatient Prescriptions   Medication Sig Dispense Refill   . acetaminophen (TYLENOL) 500 MG tablet Take 2 tablets (1,000 mg total) by mouth every 8 (eight) hours. 12 tablet 0   . ACETAMINOPHEN ER PO Take by mouth as needed.     . Ascorbic Acid (VITAMIN C) 500 MG tablet Take 500 mg by mouth daily.     Marland Kitchen aspirin EC 81 MG EC tablet Take 81 mg by mouth daily.     Marland Kitchen atorvastatin (LIPITOR) 10 MG tablet Take 1 tablet (10 mg total) by mouth daily. 90 tablet 1   . calcium & magnesium carbonates (MYLANTA) 311-232 MG per tablet Take 1 tablet by mouth daily.     . Cholecalciferol (VITAMIN D-3) 5000 UNITS TABS Take by mouth.     . gabapentin (NEURONTIN) 100 MG capsule Take 2 capsules (200 mg total) by mouth 3 (three) times daily. 12 capsule 0   . loratadine (CLARITIN) 10 MG tablet Take 10 mg by mouth  as needed.        . Melatonin 5 MG Tab Take 10 mg by mouth as needed.     . Omega-3 Fatty Acids (FISH OIL) 1200 MG CAPS Take 1,200 mg by mouth 2 (two) times daily.     . vitamin B-12 (CYANOCOBALAMIN) 1000 MCG tablet Take 1,000 mcg by mouth once a week.       No current facility-administered medications for this visit.     Allergies   Allergen Reactions   . Ciprofloxacin Swelling     Past Medical History   Diagnosis Date   . Seasonal allergies    . Hyperlipidemia    . 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 Lipoma      base of posterior neck   . Low back pain      Past Surgical History   Procedure Laterality Date   . Lasik  2003   . Excision, melanoma Bilateral 05/19/2014     Procedure: EXCISION, MELANOMA;  Surgeon: Lorenza Chick, MD;  Location: [facility] OR;  Service: General;  Laterality: Bilateral;  LEFT SHOULDER MELANOMA WIDE LOCAL EXCISION, RIGHT MID-BACK ATYPICAL NEVUS EXCISION     . Biopsy, sentinel node N/A 05/19/2014     Procedure: BIOPSY, SENTINEL NODE;  Surgeon: Lorenza Chick, MD;  [facility];  Service: General;  Laterality: N/A;   SENTINEL LYMPH NODE BIOSPY W/ NUC MED @ 8:00AM,    . Excision, lipoma N/A 11/24/2014     Procedure: EXCISION, LIPOMA OF BASE OF NECK;  Surgeon: Lorenza Chick, MD;  Location: [facility];  Service: General;  Laterality: N/A;  BACK LIPOMA EXCISION   . Excision, mass Left 04/18/2015     Procedure: EXCISION, MASS;  Surgeon: Lorenza Chick, MD;  Location:  [facility] OR;  Service: General;  Laterality: Left;  LEFT THIGH MASS EXCISION     Family History   Problem Relation Age of Onset   . Breast cancer Maternal Grandmother    . Cancer Maternal Grandmother      breast   . Hypertension Father    . Hyperlipidemia Father    . Aneurysm Father    . Heart disease Father      unsure of age   . Thyroid disease Sister    . Asthma Brother    . Hyperlipidemia Brother    . ADD / ADHD Daughter    . Asthma Daughter    . Alcohol abuse Maternal Uncle     . Alcohol abuse Maternal Grandfather      Social History     Social History   . Marital Status: Single     Spouse Name: N/A   . Number of Children: N/A   . Years of Education: N/A     Occupational History   . Not on file.     Social History Main Topics   . Smoking status: Former Smoker -- 1.00 packs/day for 35 years     Types: Cigarettes     Quit date: 10/29/2002   . Smokeless tobacco: Never Used   . Alcohol Use: 0.0 oz/week      Comment: drinks wine nightly (2 glasses); very rare other alcohol   . Drug Use: No   . Sexual Activity:     Partners: Male     Birth Control/ Protection: None     Other Topics Concern   . Not on file     Social History Narrative       REVIEW OF SYSTEMS  All other systems were reviewed and are negative except as previously noted in the HPI.       Objective:     Filed Vitals:    04/20/15 1118   BP: 133/77   Temp: 98.4 F (36.9 C)   SpO2: 98%     ECOG PS: 0  General: Vitals Reviewed. NAD  Eyes: Anicteric, normal conjunctiva  ENT: No sores in mouth  CV: RRR  RESP: CTA B  GI: NT/ND, no HSM  Neuro: AAO x 3  MS: no swelling in arms or legs  SKIN: 8 cm wide local excision scar in left shoulder without swelling, bleeding or tenderness. No evidence of satellite or in-transit lesion. 10cm excision scar in left lateral upper thigh with bruise  LYMPH: negative cervical suplarclavicular axillary inguinal.     Pathology reports   Date 04/06/14  Accession Number [id number])  Location Lt Post Shoulder  Dx MM   Clark Level IV   Thickness 1.24mm   Mitotic Rate 2 per mm   Ulceration Absent   Regression Absent       Assessment & Plan:   1. Melanoma: Stage 1B Left posterior shoulder   No clinical evidence of recurrence.   CXR, LDH on her next visit in March 2017.    Continue skin exam by Dr.Venna  every 4 months until the end of 2017.     2. Left thigh mass  Will review pathology once available.     Return to clinic in March 2017.     Tiney Rouge, MD   Medical Oncology  Director, Melanoma and Cutaneous  Oncology Therapeutics and Research  [facility]  P [phone]

## 2015-05-15 ENCOUNTER — Other Ambulatory Visit (INDEPENDENT_AMBULATORY_CARE_PROVIDER_SITE_OTHER): Payer: Self-pay | Admitting: Family Medicine

## 2015-05-15 NOTE — Telephone Encounter (Signed)
Needs fu appointment (30 minute please)

## 2015-05-16 NOTE — Telephone Encounter (Signed)
Lm for pt to make appt for refill

## 2015-05-23 ENCOUNTER — Ambulatory Visit (INDEPENDENT_AMBULATORY_CARE_PROVIDER_SITE_OTHER): Payer: BC Managed Care – PPO | Admitting: Surgery

## 2015-05-23 ENCOUNTER — Encounter (INDEPENDENT_AMBULATORY_CARE_PROVIDER_SITE_OTHER): Payer: Self-pay | Admitting: Surgery

## 2015-05-23 VITALS — BP 131/80 | HR 89 | Temp 98.4°F | Ht 62.0 in | Wt 127.0 lb

## 2015-05-23 DIAGNOSIS — Z9889 Other specified postprocedural states: Secondary | ICD-10-CM

## 2015-05-23 NOTE — Progress Notes (Signed)
[name] [name] underwent excision of a lipoma on her left hip on November 8. The final report, including FISH analysis, showed this to be a benign lipoma. She reports it is still a bit sore, but slowly improving.    PE: Incision healing well with no recurrence.    Imp: Doing well following excision of left hip lipoma    Plan: follow up prn

## 2015-08-22 ENCOUNTER — Other Ambulatory Visit: Payer: Self-pay | Admitting: Hematology & Oncology

## 2015-08-22 ENCOUNTER — Ambulatory Visit: Payer: BC Managed Care – PPO | Attending: Dermatology | Admitting: Dermatology

## 2015-08-22 ENCOUNTER — Ambulatory Visit: Payer: BC Managed Care – PPO | Attending: Hematology & Oncology | Admitting: Hematology & Oncology

## 2015-08-22 ENCOUNTER — Encounter: Payer: Self-pay | Admitting: Dermatology

## 2015-08-22 VITALS — BP 126/71 | HR 88 | Temp 98.3°F | Ht 62.0 in | Wt 127.0 lb

## 2015-08-22 DIAGNOSIS — Z85820 Personal history of malignant melanoma of skin: Secondary | ICD-10-CM | POA: Insufficient documentation

## 2015-08-22 DIAGNOSIS — C4362 Malignant melanoma of left upper limb, including shoulder: Secondary | ICD-10-CM

## 2015-08-22 DIAGNOSIS — D229 Melanocytic nevi, unspecified: Secondary | ICD-10-CM | POA: Insufficient documentation

## 2015-08-22 DIAGNOSIS — L820 Inflamed seborrheic keratosis: Secondary | ICD-10-CM | POA: Insufficient documentation

## 2015-08-22 NOTE — Progress Notes (Signed)
[facility]      HISTORY OF PRESENT ILLNESS  [name] [name] is 60 y.o. female who noted a growing mole in left shoulder which became like "blister" over 5 months. Also noted bleeding from the lesion. Saw her dermatologist Dr. Christella Noa who recommended excisional biopsy which was performed by Dr. Webb Silversmith on 04/06/14. Pathology showed 1.47 mm thick, non-ulcerated melanoma with 2 mitoses/mm2. Peripheral margin was close with melanoma in situ. Preop CXR and LDH were normal. She underwent wide local excision and sentinel lymph node biopsy on 05/19/14 which showed no residual melanoma at primary site and two negative left supraclavicular lymph nodes. She noticed new soft lump in left hip area in August 2016 for which ultrasound showed features most consistent with lipoma. She underwent excision by Dr.Hafner on 04/18/15 which confirmed it to be lipoma.    She's here for surveillance. No palpable lymph node. Patient denies any rash/itching, fatigue, weight loss, headache, cough, shortness of breath, nausea, vomiting, diarrhea or abdominal pain.       Current Outpatient Prescriptions   Medication Sig Dispense Refill   . Ascorbic Acid (VITAMIN C) 500 MG tablet Take 500 mg by mouth daily.     Marland Kitchen aspirin EC 81 MG EC tablet Take 81 mg by mouth daily.     Marland Kitchen atorvastatin (LIPITOR) 10 MG tablet Take one tablet by mouth one time daily 30 tablet 0   . Cholecalciferol (VITAMIN D-3) 5000 UNITS TABS Take by mouth.     . loratadine (CLARITIN) 10 MG tablet Take 10 mg by mouth as needed.        . Omega-3 Fatty Acids (FISH OIL) 1200 MG CAPS Take 1,200 mg by mouth 2 (two) times daily.     . vitamin B-12 (CYANOCOBALAMIN) 1000 MCG tablet Take 1,000 mcg by mouth once a week.       No current facility-administered medications for this visit.     Allergies   Allergen Reactions   . Ciprofloxacin Swelling     Past Medical History   Diagnosis Date   . Seasonal allergies    . Hyperlipidemia    . Malignant neoplasm of skin       Melanoma on left shoulder back   . Lipoma      base of posterior neck   . Low back pain      Past Surgical History   Procedure Laterality Date   . Lasik  2003   . Excision, melanoma Bilateral 05/19/2014     Procedure: EXCISION, MELANOMA;  Surgeon: Lorenza Chick, MD;  Location: [facility] OR;  Service: General;  Laterality: Bilateral;  LEFT SHOULDER MELANOMA WIDE LOCAL EXCISION, RIGHT MID-BACK ATYPICAL NEVUS EXCISION     . Biopsy, sentinel node N/A 05/19/2014     Procedure: BIOPSY, SENTINEL NODE;  Surgeon: Lorenza Chick, MD;  Location: [facility] OR;  Service: General;  Laterality: N/A;   SENTINEL LYMPH NODE BIOSPY W/ NUC MED @ 8:00AM,    . Excision, lipoma N/A 11/24/2014     Procedure: EXCISION, LIPOMA OF BASE OF NECK;  Surgeon: Lorenza Chick, MD;  Location:  [facility];  Service: General;  Laterality: N/A;  BACK LIPOMA EXCISION   . Excision, mass Left 04/18/2015     Procedure: EXCISION, MASS;  Surgeon: Lorenza Chick, MD;  [facility];  Service: General;  Laterality: Left;  LEFT THIGH MASS EXCISION     Family History   Problem Relation Age of Onset   . Breast cancer  Maternal Grandmother    . Cancer Maternal Grandmother      breast   . Hypertension Father    . Hyperlipidemia Father    . Aneurysm Father    . Heart disease Father      unsure of age   . Thyroid disease Sister    . Asthma Brother    . Hyperlipidemia Brother    . ADD / ADHD Daughter    . Asthma Daughter    . Alcohol abuse Maternal Uncle    . Alcohol abuse Maternal Grandfather      Social History     Social History   . Marital Status: Single     Spouse Name: N/A   . Number of Children: N/A   . Years of Education: N/A     Occupational History   . Not on file.     Social History Main Topics   . Smoking status: Former Smoker -- 1.00 packs/day for 35 years     Types: Cigarettes     Quit date: 10/29/2002   . Smokeless tobacco: Never Used   . Alcohol Use: 0.0 oz/week      Comment: drinks wine nightly (2 glasses); very rare other alcohol    . Drug Use: No   . Sexual Activity:     Partners: Male     Birth Control/ Protection: None     Other Topics Concern   . Not on file     Social History Narrative       REVIEW OF SYSTEMS  All other systems were reviewed and are negative except as previously noted in the HPI.       Objective:     ECOG PS: 0  General: Vitals Reviewed. NAD  Eyes: Anicteric, normal conjunctiva  ENT: No sores in mouth  CV: RRR  RESP: CTA B  GI: NT/ND, no HSM  Neuro: AAO x 3  MS: no swelling in arms or legs  SKIN: 8 cm wide local excision scar in left shoulder without swelling, bleeding or tenderness. No evidence of satellite or in-transit lesion. 10cm excision scar in left lateral upper thigh with bruise  LYMPH: negative cervical suplarclavicular axillary inguinal.     Pathology reports   Date 04/06/14  Accession Number [id number])  Location Lt Post Shoulder  Dx MM   Clark Level IV   Thickness 1.64mm   Mitotic Rate 2 per mm   Ulceration Absent   Regression Absent       Assessment & Plan:   1. Melanoma: Stage 1B Left posterior shoulder   No clinical evidence of recurrence.   CXR today.  LDH to be done at in 1-2weeks during her next blood draw.      Return to clinic in 4 months.     Tiney Rouge, MD   Medical Oncology  Director, Melanoma and Cutaneous Oncology Therapeutics and Research  [facility]  P [phone]

## 2015-08-22 NOTE — Progress Notes (Signed)
[facility]  [facility]     Chief Complaint   Patient presents with   . Follow-up       HISTORY OF PRESENT ILLNESS  Interval: Lipoma (confirmed no biopsy and FISH) on left lateral thigh removed.  Noticed mole on her abdomen, not sure if it is new, but it is asymptomatic.    She also has a persistently dry skin patch on her right hip that she does not regularly apply moisturizer - no itching, pain or bleeding.    Health otherwise stable.    Detailed:  [name] [name] is 60 y.o. female with a history of Stage 1B Left posterior shoulder. Patient noted a growing mole which became like a "pinkish/purpleish blister"  over the past 5 months, + bleeding. Saw her dermatologist Dr. Christella Noa who recommended excisional biopsy which was performed by Dr. Webb Silversmith on 04/06/14. Pathology showed 1.47 mm thick, non-ulcerated melanoma with 2 mitoses/mm2. Peripheral margin was close with melanoma in situ. She underwent wide local excision and sentinel lymph node biopsy on 05/19/14 which showed no residual melanoma at primary site and two negative left supraclavicular lymph nodes. Pre op CXR and LDH were normal.    No personal/family history of skin cancer. No tanning bed use. No blistering sunburns.      Current Outpatient Prescriptions   Medication Sig Dispense Refill   . Ascorbic Acid (VITAMIN C) 500 MG tablet Take 500 mg by mouth daily.     Marland Kitchen aspirin EC 81 MG EC tablet Take 81 mg by mouth daily.     Marland Kitchen atorvastatin (LIPITOR) 10 MG tablet Take one tablet by mouth one time daily 30 tablet 0   . Cholecalciferol (VITAMIN D-3) 5000 UNITS TABS Take by mouth.     . loratadine (CLARITIN) 10 MG tablet Take 10 mg by mouth as needed.        . Omega-3 Fatty Acids (FISH OIL) 1200 MG CAPS Take 1,200 mg by mouth 2 (two) times daily.     . vitamin B-12 (CYANOCOBALAMIN) 1000 MCG tablet Take 1,000 mcg by mouth once a week.       No current facility-administered medications for this visit.      Allergies   Allergen Reactions   . Ciprofloxacin Swelling     Past Medical History   Diagnosis Date   . Seasonal allergies    . Hyperlipidemia    . 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 Lipoma      base of posterior neck   . Low back pain      Past Surgical History   Procedure Laterality Date   . Lasik  2003   . Excision, melanoma Bilateral 05/19/2014     Procedure: EXCISION, MELANOMA;  Surgeon: Lorenza Chick, MD;  Location: [facility] OR;  Service: General;  Laterality: Bilateral;  LEFT SHOULDER MELANOMA WIDE LOCAL EXCISION, RIGHT MID-BACK ATYPICAL NEVUS EXCISION     . Biopsy, sentinel node N/A 05/19/2014     Procedure: BIOPSY, SENTINEL NODE;  Surgeon: Lorenza Chick, MD;  Location: [facility] OR;  Service: General;  Laterality: N/A;   SENTINEL LYMPH NODE BIOSPY W/ NUC MED @ 8:00AM,    . Excision, lipoma N/A 11/24/2014     Procedure: EXCISION, LIPOMA OF BASE OF NECK;  Surgeon: Lorenza Chick, MD;  Location: [facility];  Service: General;  Laterality: N/A;  BACK LIPOMA EXCISION   . Excision, mass  Left 04/18/2015     Procedure: EXCISION, MASS;  Surgeon: Lorenza Chick, MD;  Location: [facility] OR;  Service: General;  Laterality: Left;  LEFT THIGH MASS EXCISION     Family History   Problem Relation Age of Onset   . Breast cancer Maternal Grandmother    . Cancer Maternal Grandmother      breast   . Hypertension Father    . Hyperlipidemia Father    . Aneurysm Father    . Heart disease Father      unsure of age   . Thyroid disease Sister    . Asthma Brother    . Hyperlipidemia Brother    . ADD / ADHD Daughter    . Asthma Daughter    . Alcohol abuse Maternal Uncle    . Alcohol abuse Maternal Grandfather      Social History     Social History   . Marital Status: Single     Spouse Name: N/A   . Number of Children: N/A   . Years of Education: N/A     Occupational History   . Not on file.     Social History Main Topics   . Smoking status: Former Smoker -- 1.00 packs/day for 35 years      Types: Cigarettes     Quit date: 10/29/2002   . Smokeless tobacco: Never Used   . Alcohol Use: 0.0 oz/week      Comment: drinks wine nightly (2 glasses); very rare other alcohol   . Drug Use: No   . Sexual Activity:     Partners: Male     Birth Control/ Protection: None     Other Topics Concern   . Not on file     Social History Narrative       REVIEW OF SYSTEMS  Const - no fever no chills  =  Integ - no lesions of concern     Objective:     Filed Vitals:    08/22/15 1253   BP: 126/71   Pulse: 88   Temp: 98.3 F (36.8 C)   SpO2: 98%      Constitutional: General appearance: NAD, conversant    HEENT: normocephalic, atraumatic. Conjunctiva, lids, lips, teeth, gums and oropharynx examined with no suspicious lesions    SKIN:A total body skin examination is completed including:   - palpation of scalp and inspection of hair of scalp, eyebrows, face, chest and extremities   - inspection of eccrine and apocrine glands.   - inspection and/or palpation of the skin and subcutaneous tissues with the following anatomic skin sites examined with the following findings:   1. Head including face - no lesions of concern  2. Neck  - no lesions of concern  3. Chest including breasts, and axillae - no lesions of concern  4. Abdomen - no lesions of concern, uniformly dark brown 4mm minimally elevated or left abdomen superior and lateral to umbilicous  5. Genitalia, groin, buttocks - no lesions of concern  6. Back -   a. A. Posterior base of neck-healed scar (excised lipoma site)  7. Right upper extremity - no lesions of concern  8. Left upper extremity -   a. posterior shoulder 6 cm well healed scar (MM site 10/15) no pigmentation or nodulatrity  9. Right lower extremity - no lesions of concern - right lateral hip/superior thigh with 2cm erythematous oval plaque with increased skin markings (ISK vs LPLK vs less likely NMSC - LN2 today)  10. Left lower extremity -   a. Left outer thigh - transverse scar 10cm (lipoma excision)-  posterior aspect is a 4mm heart shaped dark brown macule     - Dermatoscopy was used to evaluate the nevi.   - General skin: multiple brown macules, size 2-83mm, approximately 50 in number, distributed primarily on the trunk. Extensive photodamage and rhytids    LYMPHATIC:Examination of lymph node basins is completed, including the H and N, axilla and groin. There is no lymphadenopathy.    NECK: supple, no masses    CVS: no swelling, varicosities or edema    EXTREMITIES: inspection and palpation of digits and nails revealed no abnormalities    GI:  External anus without suspicious lesions    NEURO/PSYCH: alert and oriented, pleasant      Radiologic Studies None    Pathology reports   Date 04/06/14  Accession Number ([id number])  Location Lt Post Shoulder  Dx MM   Clark Level IV   Thickness 1.67mm   Mitotic Rate 2 per mm   Ulceration Absent   Regression Absent     Procedure: Cryotherapy to inflamed SK   Obtained verbal consent to perform cryotherapy after discussing benefits, risk of developing a blister, crust or scab and possible need for multiple treatments, recurrence/ persistence of lesion, risk of scar or keloid, and/ or developing permanent brown or white spots. Liquid nitrogen applied for 10  seconds with 2  freeze thaw cycles to 1 lesion(s). Patient tolerated the procedure well and was provided wound care instructions.      Assessment & Plan:   60 y.o. female with a history of Stage 1B Left posterior shoulder 04/06/14 s/p WLE and negative SLN     1. Melanoma: Stage 1B Left posterior shoulder 03/2014  - no evidence of recurrence  - q3-4 month skin checks    2. Inflamed SK : LN2 today as above.  If not smaller or gone at next follow-up will consider biopsy.     3. Multiple Nevi examined -  none worrisome warranting biopsy.    4. Safe sun practices reviewed including:  1. Use of sunscreen and application, need for reapplication  2. Discussed exposure through car windows and tinting  3. Sun protective clothing  including hats, sunglasses and other      D. Ruthann Cancer, MD  Dermatology Resident PGY4    Suraj S. Ruel Favors, MD, FAAD  Medical Director, Melanoma and Skin Cancer Center  [facility]  [phone] (t)   [phone] (f)       I personally reviewed the patients history, pathology when relevant, and completed an independent skin examination. I agree with all components of this written note. I have made addendums where/if needed. S. VENNA.

## 2015-08-23 ENCOUNTER — Encounter: Payer: Self-pay | Admitting: Hematology & Oncology

## 2015-09-06 ENCOUNTER — Other Ambulatory Visit (INDEPENDENT_AMBULATORY_CARE_PROVIDER_SITE_OTHER): Payer: Self-pay | Admitting: Hematology & Oncology

## 2015-09-07 LAB — LACTATE DEHYDROGENASE: LDH: 265 IU/L — ABNORMAL HIGH (ref 119–226)

## 2015-09-11 ENCOUNTER — Telehealth: Payer: Self-pay

## 2015-09-11 NOTE — Telephone Encounter (Signed)
Left a message for [name] [name] to inform her per Dr. Crosby Oyster that her LDH level is elevated at 265.  Recommend it be repeated in 4 months with next clinic visit. Encouraged patient to call with any questions or concerns.

## 2015-12-05 ENCOUNTER — Encounter: Payer: Self-pay | Admitting: Dermatology

## 2015-12-05 ENCOUNTER — Ambulatory Visit: Payer: BC Managed Care – PPO | Attending: Hematology & Oncology | Admitting: Hematology & Oncology

## 2015-12-05 ENCOUNTER — Ambulatory Visit: Payer: BC Managed Care – PPO | Admitting: Hematology & Oncology

## 2015-12-05 ENCOUNTER — Encounter: Payer: Self-pay | Admitting: Hematology & Oncology

## 2015-12-05 ENCOUNTER — Ambulatory Visit: Payer: BC Managed Care – PPO | Attending: Dermatology | Admitting: Dermatology

## 2015-12-05 VITALS — BP 149/84 | HR 84 | Temp 98.2°F | Ht 62.0 in | Wt 127.0 lb

## 2015-12-05 VITALS — BP 149/83 | HR 84 | Temp 98.2°F | Ht 62.0 in | Wt 127.0 lb

## 2015-12-05 DIAGNOSIS — C4362 Malignant melanoma of left upper limb, including shoulder: Secondary | ICD-10-CM

## 2015-12-05 DIAGNOSIS — D229 Melanocytic nevi, unspecified: Secondary | ICD-10-CM | POA: Insufficient documentation

## 2015-12-05 DIAGNOSIS — Z1283 Encounter for screening for malignant neoplasm of skin: Secondary | ICD-10-CM | POA: Insufficient documentation

## 2015-12-05 NOTE — Progress Notes (Signed)
[facility] [place] [facility]  [facility]     Chief Complaint   Patient presents with   . Follow-up       HISTORY OF PRESENT ILLNESS  Interval:  Presents today for TBSE on surveillance for melanoma left posterior shoulder. States last TBSE was with Dr.Venna in March 2017, at which time no suspicious lesions were identified warranting biopsy. Cryotherapy performed to inflamed SK on right later hip. Patient denies re-growth. Denies any new, changing, itching, peeling, bleeding, painful lesions. Denies enlarged or tender lymph nodes. States she feels well.     Detailed:  [name] [name] is 60 y.o. female with a history of Stage 1B Left posterior shoulder. Patient noted a growing mole which became like a "pinkish/purpleish blister"  over the past 5 months, + bleeding. Saw her dermatologist Dr. Christella Noa who recommended excisional biopsy which was performed by Dr. Webb Silversmith on 04/06/14. Pathology showed 1.47 mm thick, non-ulcerated melanoma with 2 mitoses/mm2. Peripheral margin was close with melanoma in situ. She underwent wide local excision and sentinel lymph node biopsy on 05/19/14 which showed no residual melanoma at primary site and two negative left supraclavicular lymph nodes. Pre op CXR and LDH were normal.    No personal/family history of skin cancer. No tanning bed use. No blistering sunburns.    Of note, lipoma (confirmed on biopsy and FISH November 2016) on left lateral thigh was removed.   Current Outpatient Prescriptions   Medication Sig Dispense Refill   . Ascorbic Acid (VITAMIN C) 500 MG tablet Take 500 mg by mouth daily.     Marland Kitchen aspirin EC 81 MG EC tablet Take 81 mg by mouth daily.     Marland Kitchen atorvastatin (LIPITOR) 10 MG tablet Take one tablet by mouth one time daily 30 tablet 0   . Cholecalciferol (VITAMIN D-3) 5000 UNITS TABS Take by mouth.     . Levothyroxine Sodium 25 MCG Cap Take by mouth daily.     Marland Kitchen loratadine (CLARITIN) 10 MG tablet Take 10 mg by mouth as needed.         . Omega-3 Fatty Acids (FISH OIL) 1200 MG CAPS Take 1,200 mg by mouth 2 (two) times daily.     . vitamin B-12 (CYANOCOBALAMIN) 1000 MCG tablet Take 1,000 mcg by mouth once a week.       No current facility-administered medications for this visit.     Allergies   Allergen Reactions   . Ciprofloxacin Swelling     Past Medical History   Diagnosis Date   . Seasonal allergies    . Hyperlipidemia    . 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 Lipoma      base of posterior neck   . Low back pain      Past Surgical History   Procedure Laterality Date   . Lasik  2003   . Excision, melanoma Bilateral 05/19/2014     Procedure: EXCISION, MELANOMA;  Surgeon: Lorenza Chick, MD;  Location: [facility] OR;  Service: General;  Laterality: Bilateral;  LEFT SHOULDER MELANOMA WIDE LOCAL EXCISION, RIGHT MID-BACK ATYPICAL NEVUS EXCISION     . Biopsy, sentinel node N/A 05/19/2014     Procedure: BIOPSY, SENTINEL NODE;  Surgeon: Lorenza Chick, MD;  Location: [facility] OR;  Service: General;  Laterality: N/A;   SENTINEL LYMPH NODE BIOSPY W/ NUC MED @ 8:00AM,    . Excision, lipoma N/A 11/24/2014  Procedure: EXCISION, LIPOMA OF BASE OF NECK;  Surgeon: Lorenza Chick, MD;  Location: [facility];  Service: General;  Laterality: N/A;  BACK LIPOMA EXCISION   . Excision, mass Left 04/18/2015     Procedure: EXCISION, MASS;  Surgeon: Lorenza Chick, MD;  Location: [facility] OR;  Service: General;  Laterality: Left;  LEFT THIGH MASS EXCISION     Family History   Problem Relation Age of Onset   . Breast cancer Maternal Grandmother    . Cancer Maternal Grandmother      breast   . Hypertension Father    . Hyperlipidemia Father    . Aneurysm Father    . Heart disease Father      unsure of age   . Thyroid disease Sister    . Asthma Brother    . Hyperlipidemia Brother    . ADD / ADHD Daughter    . Asthma Daughter    . Alcohol abuse Maternal Uncle    . Alcohol abuse Maternal Grandfather      Social History      Social History   . Marital Status: Single     Spouse Name: N/A   . Number of Children: N/A   . Years of Education: N/A     Occupational History   . Not on file.     Social History Main Topics   . Smoking status: Former Smoker -- 1.00 packs/day for 35 years     Types: Cigarettes     Quit date: 10/29/2002   . Smokeless tobacco: Never Used   . Alcohol Use: 0.0 oz/week      Comment: drinks wine nightly (2 glasses); very rare other alcohol   . Drug Use: No   . Sexual Activity:     Partners: Male     Birth Control/ Protection: None     Other Topics Concern   . Not on file     Social History Narrative       REVIEW OF SYSTEMS  Const - no fever no chills   CV - no chest pain   Resp- no cough   Neuro - no headache   Abd - no pain   Integ - no lesions of concern   Psych - no depression or anxiety   All other systems reviewed and negative      Objective:     Filed Vitals:    12/05/15 1341   BP: 149/84   Pulse: 84   Temp: 98.2 F (36.8 C)   SpO2: 98%      Constitutional: General appearance: NAD, conversant    HEENT: normocephalic, atraumatic. Conjunctiva, lids, lips, teeth, gums and oropharynx examined with no suspicious lesions    SKIN:A total body skin examination is completed including:   - palpation of scalp and inspection of hair of scalp, eyebrows, face, chest and extremities   - inspection of eccrine and apocrine glands.   - inspection and/or palpation of the skin and subcutaneous tissues with the following anatomic skin sites examined with the following findings:   1. Head including face - no lesions of concern  2. Neck  - no lesions of concern  3. Chest including breasts, and axillae - no lesions of concern  4. Abdomen - no lesions of concern, uniformly dark brown 4mm minimally elevated or left abdomen superior and lateral to umbilicous  5. Genitalia, groin, buttocks - no lesions of concern  6. Back -   a. A. Posterior base of neck-healed  scar (excised lipoma site)  7. Right upper extremity - no lesions of  concern  8. Left upper extremity -   a. posterior shoulder 6 cm well healed scar (MM site 10/15) no pigmentation or nodulatrity  9. Right lower extremity - no lesions of concern   10. Left lower extremity -   a. Left outer thigh - transverse scar 10cm (lipoma excision)- posterior aspect is a 4mm heart shaped dark brown macule (stable)     - Dermatoscopy was used to evaluate the nevi.   - General skin: multiple brown macules, size 2-45mm, approximately 50 in number, distributed primarily on the trunk. Extensive photodamage and rhytids    LYMPHATIC:Examination of lymph node basins is completed, including the H and N, axilla and groin. There is no lymphadenopathy.    NECK: supple, no masses    CVS: no swelling, varicosities or edema    EXTREMITIES: inspection and palpation of digits and nails revealed no abnormalities    GI:  External anus without suspicious lesions    NEURO/PSYCH: alert and oriented, pleasant      Pathology reports   Date 04/06/14  Accession Number [id number])  Location Lt Post Shoulder  Dx MM   Clark Level IV   Thickness 1.28mm   Mitotic Rate 2 per mm   Ulceration Absent   Regression Absent           Assessment & Plan:   60 y.o. female with a history of Stage 1B Left posterior shoulder 04/06/14 s/p WLE and negative SLN     1. Melanoma: Stage 1B Left posterior shoulder 03/2014  -No evidence of recurrence on exam  -Continue TBSE every 3-4 months until October 2017, then every 6 months x 3 years until October 2020, then annually.   -Patient seen by Dr. Crosby Oyster today. Previous LDH elevated. Repeat LDH per Dr. Crosby Oyster.    2. TBSE   - total body skin examination performed today   - no lesions concerning to warrant biopsy today   - Several have been documented as above and will be followed   - Sun protection reviewed   - ABCDEs of melanoma reviewed   - Monthly self-skin examination was advised and the patient was instructed to return to clinic prior to scheduled visit if any suspicious or worrisome findings are  discovered    3. Safe sun practices reviewed including  -Use of sunscreen and application, need for reapplication  -Discussed exposure through car windows and tinting  -Sun protective clothing including hats, sunglasses and other      RTC in 3 months for TBSE.       Jeannette Corpus, DNP, FNP-BC  Nurse Practitioner  [facility]  [address]  [place], [place] [address]  [phone]  [phone]      Suraj S. Ruel Favors, MD, FAAD  Medical Director, Melanoma and Skin Cancer Center  [facility]  [phone] (t)   [phone] (f)     I personally reviewed the patients history, pathology when relevant, and completed an independent skin examination. I agree with all components of this written note. I have made addendums where/if needed. S. VENNA.

## 2015-12-05 NOTE — Progress Notes (Signed)
[facility]      HISTORY OF PRESENT ILLNESS  [name] [name] is 60 y.o. female who noted a growing mole in left shoulder which became like "blister" over 5 months. Also noted bleeding from the lesion. Saw her dermatologist Dr. Christella Noa who recommended excisional biopsy which was performed by Dr. Webb Silversmith on 04/06/14. Pathology showed 1.47 mm thick, non-ulcerated melanoma with 2 mitoses/mm2. Peripheral margin was close with melanoma in situ. Preop CXR and LDH were normal. She underwent wide local excision and sentinel lymph node biopsy on 05/19/14 which showed no residual melanoma at primary site and two negative left supraclavicular lymph nodes. She noticed new soft lump in left hip area in August 2016 for which ultrasound showed features most consistent with lipoma. She underwent excision by Dr.Hafner on 04/18/15 which confirmed it to be lipoma.    Last LDH was elevated (265) in March 2017. CXR 08/22/15 showed no evidence of metastatic disease.     She presents for follow-up on surveillance for melanoma of left posterior shoulder.  Denies enlarged or tender lymph nodes.. Patient denies any rash/itching, fatigue, weight loss, headache, cough, shortness of breath, nausea, vomiting, diarrhea or abdominal pain. States she feels well.     Current Outpatient Prescriptions   Medication Sig Dispense Refill   . Ascorbic Acid (VITAMIN C) 500 MG tablet Take 500 mg by mouth daily.     Marland Kitchen aspirin EC 81 MG EC tablet Take 81 mg by mouth daily.     Marland Kitchen atorvastatin (LIPITOR) 10 MG tablet Take one tablet by mouth one time daily 30 tablet 0   . Cholecalciferol (VITAMIN D-3) 5000 UNITS TABS Take by mouth.     . Levothyroxine Sodium 25 MCG Cap Take by mouth daily.     Marland Kitchen loratadine (CLARITIN) 10 MG tablet Take 10 mg by mouth as needed.        . Omega-3 Fatty Acids (FISH OIL) 1200 MG CAPS Take 1,200 mg by mouth 2 (two) times daily.     . vitamin B-12 (CYANOCOBALAMIN) 1000 MCG tablet Take 1,000 mcg by mouth once  a week.       No current facility-administered medications for this visit.     Allergies   Allergen Reactions   . Ciprofloxacin Swelling     Past Medical History   Diagnosis Date   . Seasonal allergies    . Hyperlipidemia    . 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 Lipoma      base of posterior neck   . Low back pain      Past Surgical History   Procedure Laterality Date   . Lasik  2003   . Excision, melanoma Bilateral 05/19/2014     Procedure: EXCISION, MELANOMA;  Surgeon: Lorenza Chick, MD;  Location: [facility] OR;  Service: General;  Laterality: Bilateral;  LEFT SHOULDER MELANOMA WIDE LOCAL EXCISION, RIGHT MID-BACK ATYPICAL NEVUS EXCISION     . Biopsy, sentinel node N/A 05/19/2014     Procedure: BIOPSY, SENTINEL NODE;  Surgeon: Lorenza Chick, MD;  Location: [facility] OR;  Service: General;  Laterality: N/A;   SENTINEL LYMPH NODE BIOSPY W/ NUC MED @ 8:00AM,    . Excision, lipoma N/A 11/24/2014     Procedure: EXCISION, LIPOMA OF BASE OF NECK;  Surgeon: Lorenza Chick, MD;  Location: [facility];  Service: General;  Laterality: N/A;  BACK LIPOMA EXCISION   . Excision, mass Left  04/18/2015     Procedure: EXCISION, MASS;  Surgeon: Lorenza Chick, MD;  Location: [facility];  Service: General;  Laterality: Left;  LEFT THIGH MASS EXCISION     Family History   Problem Relation Age of Onset   . Breast cancer Maternal Grandmother    . Cancer Maternal Grandmother      breast   . Hypertension Father    . Hyperlipidemia Father    . Aneurysm Father    . Heart disease Father      unsure of age   . Thyroid disease Sister    . Asthma Brother    . Hyperlipidemia Brother    . ADD / ADHD Daughter    . Asthma Daughter    . Alcohol abuse Maternal Uncle    . Alcohol abuse Maternal Grandfather      Social History     Social History   . Marital Status: Single     Spouse Name: N/A   . Number of Children: N/A   . Years of Education: N/A     Occupational History   . Not on file.     Social History  Main Topics   . Smoking status: Former Smoker -- 1.00 packs/day for 35 years     Types: Cigarettes     Quit date: 10/29/2002   . Smokeless tobacco: Never Used   . Alcohol Use: 0.0 oz/week      Comment: drinks wine nightly (2 glasses); very rare other alcohol   . Drug Use: No   . Sexual Activity:     Partners: Male     Birth Control/ Protection: None     Other Topics Concern   . Not on file     Social History Narrative       REVIEW OF SYSTEMS  All other systems were reviewed and are negative except as previously noted in the HPI.       Objective:     ECOG PS: 0  General: Vitals Reviewed. NAD  Eyes: Anicteric, normal conjunctiva  ENT: No sores in mouth  CV: RRR  RESP: CTA B  GI: NT/ND, no HSM  Neuro: AAO x 3  MS: no swelling in arms or legs  SKIN: 8 cm wide local excision scar in left shoulder without swelling, bleeding or tenderness. No evidence of satellite or in-transit lesion. 10cm excision scar in left lateral upper thigh  LYMPH: negative cervical suplarclavicular axillary inguinal.     Pathology reports   Date 04/06/14  Accession Number [id number])  Location Lt Post Shoulder  Dx MM   Clark Level IV   Thickness 1.32mm   Mitotic Rate 2 per mm   Ulceration Absent   Regression Absent       Assessment & Plan:   1. Melanoma: Stage 1B Left posterior shoulder   -No clinical evidence of recurrence.   -CXR 08/22/15 showed no evidence of metastatic disease.   -Elevated LDH noted March 2017. Repeat LDH ordered today. If LDH remains elevated, will order CT chest/abdomen/pelvis. If LDH within normal limits, then RTC in 6 months for follow-up when I will obtain CXR     Tiney Rouge, MD   Medical Oncology  Director, Melanoma and Cutaneous Oncology Therapeutics and Research  [facility]  P [phone]

## 2015-12-13 ENCOUNTER — Telehealth: Payer: Self-pay

## 2015-12-13 LAB — LACTATE DEHYDROGENASE: LDH: 223 IU/L (ref 119–226)

## 2015-12-13 NOTE — Telephone Encounter (Signed)
-----   Message from Rosario Adie, [place] FNP sent at 12/13/2015  8:55 AM EDT -----  Please call patient and let her know her recent LDH was normal. Dr. Crosby Oyster will see her in 6 months. She will need a skin exam in 3 months.    Thanks,    Gordy Councilman  ----- Message -----     From: Leory Plowman, [facility] Lab Results In     Sent: 12/13/2015   6:40 AM       To: Rosario Adie, DNP FNP

## 2015-12-13 NOTE — Telephone Encounter (Signed)
Left a VM Regarding below message advised patient  To call back if she had any questions.

## 2016-04-06 ENCOUNTER — Emergency Department (HOSPITAL_BASED_OUTPATIENT_CLINIC_OR_DEPARTMENT_OTHER): Payer: BLUE CROSS/BLUE SHIELD

## 2016-04-06 ENCOUNTER — Encounter (HOSPITAL_BASED_OUTPATIENT_CLINIC_OR_DEPARTMENT_OTHER): Payer: Self-pay | Admitting: *Deleted

## 2016-04-06 ENCOUNTER — Emergency Department (HOSPITAL_BASED_OUTPATIENT_CLINIC_OR_DEPARTMENT_OTHER)
Admission: EM | Admit: 2016-04-06 | Discharge: 2016-04-06 | Disposition: A | Discharge status: Home / Self Care | Payer: BLUE CROSS/BLUE SHIELD | Attending: Emergency Medicine | Admitting: Emergency Medicine

## 2016-04-06 DIAGNOSIS — Z79899 Other long term (current) drug therapy: Secondary | ICD-10-CM | POA: Diagnosis not present

## 2016-04-06 DIAGNOSIS — Z85828 Personal history of other malignant neoplasm of skin: Secondary | ICD-10-CM | POA: Insufficient documentation

## 2016-04-06 DIAGNOSIS — Z7982 Long term (current) use of aspirin: Secondary | ICD-10-CM | POA: Diagnosis not present

## 2016-04-06 DIAGNOSIS — Z87891 Personal history of nicotine dependence: Secondary | ICD-10-CM | POA: Insufficient documentation

## 2016-04-06 DIAGNOSIS — M79671 Pain in right foot: Secondary | ICD-10-CM

## 2016-04-06 HISTORY — DX: Unspecified malignant neoplasm of skin, unspecified: C44.90

## 2016-04-06 HISTORY — DX: Disorder of thyroid, unspecified: E07.9

## 2016-04-06 HISTORY — DX: Plantar fascial fibromatosis: M72.2

## 2016-04-06 HISTORY — DX: Pure hypercholesterolemia, unspecified: E78.00

## 2016-04-06 MED ORDER — HYDROCODONE-ACETAMINOPHEN 5-325 MG PO TABS
1.0000 | ORAL_TABLET | Freq: Once | ORAL | Status: AC
  Administered 2016-04-06: 1 via ORAL
  Filled 2016-04-06: qty 1

## 2016-04-06 MED ORDER — HYDROCODONE-ACETAMINOPHEN 5-325 MG PO TABS: 1.0000 | ORAL_TABLET | Freq: Four times a day (QID) | ORAL | 0 refills | Status: AC | PRN

## 2016-04-06 NOTE — ED Triage Notes (Signed)
Pt c/o right heel pain that started on Friday but getting worse. Pt states she is visiting from Out of town. Right heel with redness and patient states she is unable to bear weight due to the pain. Denies any fevers. States hx of plantar fasciitis

## 2016-04-06 NOTE — ED Provider Notes (Signed)
[facility] Provider Note   CSN: [id number] Arrival date & time: 04/06/16  [id number]     History   Chief Complaint Chief Complaint  Patient presents with  . right heel pain    HPI Natasha Faulkner is a 60 y.o. female With the past diagnosis of plantar fasciitis who presents with right foot pain. Patient reports that she is from [place] and is here visiting family for the next few days. He says that the pain in the bottom of her right foot and heal is severe and preventing her from being able to interact with family. She presents for evaluation. She denies any trauma, any numbness, tingling, or weakness. She denies any Pain anywhere else in her body. She denies any fevers, chills, chest pain, shortness of breath, nausea, vomiting. She says that she was diagnosed several months ago plantar fasciitis but has not followed up with anyone for this. She describes the pain as severe, worsened with walking, and nonradiating.  The history is provided by the patient and medical records. No language interpreter was used.  Foot Pain  This is a recurrent problem. The current episode started more than 2 days ago. The problem occurs daily. The problem has not changed since onset.Pertinent negatives include no chest pain, no abdominal pain, no headaches and no shortness of breath. The symptoms are aggravated by standing and walking. Nothing relieves the symptoms. She has tried nothing for the symptoms. The treatment provided no relief.    Past Medical History:  Diagnosis Date  . High cholesterol   . High cholesterol   . Plantar fasciitis of right foot   . Skin cancer   . Thyroid disease     There are no active problems to display for this patient.   History reviewed. No pertinent surgical history.  OB History    No data available       Home Medications    Prior to Admission medications   Medication Sig Start Date End Date Taking? Authorizing Provider  aspirin EC 81 MG  tablet Take 81 mg by mouth daily.   Yes Historical Provider, MD  atorvastatin (LIPITOR) 10 MG tablet Take 10 mg by mouth daily.   Yes Historical Provider, MD  levothyroxine (SYNTHROID, LEVOTHROID) 25 MCG tablet Take 25 mcg by mouth daily before breakfast.   Yes Historical Provider, MD  loratadine (CLARITIN) 10 MG tablet Take 10 mg by mouth daily.   Yes Historical Provider, MD    Family History No family history on file.  Social History Social History  Substance Use Topics  . Smoking status: Former Research scientist (life sciences)  . Smokeless tobacco: Never Used  . Alcohol use Yes     Comment: 2-3 drinks per day      Allergies   Ciprofloxacin   Review of Systems Review of Systems  Constitutional: Negative for activity change, chills, diaphoresis, fatigue and fever.  HENT: Negative for congestion and rhinorrhea.   Eyes: Negative for visual disturbance.  Respiratory: Negative for cough, chest tightness, shortness of breath and stridor.   Cardiovascular: Negative for chest pain, palpitations and leg swelling.  Gastrointestinal: Negative for abdominal distention, abdominal pain, constipation, diarrhea, nausea and vomiting.  Genitourinary: Negative for difficulty urinating, dysuria, flank pain, frequency, hematuria, menstrual problem, pelvic pain, vaginal bleeding and vaginal discharge.  Musculoskeletal: Negative for back pain and neck pain.  Skin: Negative for rash and wound.  Neurological: Negative for dizziness, tremors, syncope, weakness, light-headedness, numbness and headaches.  Psychiatric/Behavioral: Negative for agitation and confusion.  All other systems reviewed and are negative.    Physical Exam Updated Vital Signs BP 152/82   Pulse 97   Temp 98.2 F (36.8 C)   Resp 18   Ht 5\' 2"  (1.575 m)   Wt 127 lb (57.6 kg)   SpO2 98%   BMI 23.23 kg/m   Physical Exam  Constitutional: She appears well-developed and well-nourished. No distress.  HENT:  Head: Normocephalic and atraumatic.    Eyes: Conjunctivae are normal.  Neck: Neck supple.  Cardiovascular: Normal rate and regular rhythm.   No murmur heard. Pulmonary/Chest: Effort normal and breath sounds normal. No respiratory distress.  Abdominal: Soft. There is no tenderness.  Musculoskeletal: Normal range of motion. She exhibits tenderness. She exhibits no edema or deformity.       Right foot: There is tenderness. There is normal range of motion, no swelling, normal capillary refill, no crepitus, no deformity and no laceration.       Feet:  Neurological: She is alert.  Skin: Skin is warm and dry.  Psychiatric: She has a normal mood and affect.  Nursing note and vitals reviewed.    ED Treatments / Results  Labs (all labs ordered are listed, but only abnormal results are displayed) Labs Reviewed - No data to display  EKG  EKG Interpretation None       Radiology Dg Ankle Complete Right  Result Date: 04/06/2016 CLINICAL DATA:  Chronic right ankle pain, worsening today. No reported injury. EXAM: RIGHT ANKLE - COMPLETE 3+ VIEW COMPARISON:  None. FINDINGS: There is no evidence of fracture, dislocation, or joint effusion. There is no evidence of arthropathy or other focal bone abnormality. Soft tissues are unremarkable. IMPRESSION: Negative. Electronically Signed   By: Ilona Sorrel M.D.   On: 04/06/2016 08:11   Dg Foot Complete Right  Result Date: 04/06/2016 CLINICAL DATA:  Chronic right foot pain, worsening today. No reported injury. EXAM: RIGHT FOOT COMPLETE - 3+ VIEW COMPARISON:  None. FINDINGS: There is no evidence of fracture or dislocation. There is no evidence of arthropathy or other focal bone abnormality. Soft tissues are unremarkable. IMPRESSION: Negative. Electronically Signed   By: Ilona Sorrel M.D.   On: 04/06/2016 08:10    Procedures Procedures (including critical care time)  Medications Ordered in ED Medications  HYDROcodone-acetaminophen (NORCO/VICODIN) 5-325 MG per tablet 1 tablet (1 tablet  Oral Given 04/06/16 0747)     Initial Impression / Assessment and Plan / ED Course  I have reviewed the triage vital signs and the nursing notes.  Pertinent labs & imaging results that were available during my care of the patient were reviewed by me and considered in my medical decision making (see chart for details).  Clinical Course    Natasha Faulkner is a 60 y.o. female With the past diagnosis of plantar fasciitis who presents with right foot pain.  History and exam are seen above.  Patient had tenderness on the plantar area of her foot near the heel. Patient had normal strength, sensation, pulses, and capillary refill. No other ankle pain, leg pain.  Patient had x-ray to look for foreign body such as a splinter, fracture, or other injury.  Imaging negative for fracture.  Patient felt better after pain medications. Patient informed that her symptoms are likely secondary to her later fasciitis. Patient was counseled on stretching exercises and conservative management strategies. Patient given crutches and pain medicine. Patient will follow up with her PCP/orthopedist/podiatrist in [place] this week. Patient discharged in good condition.  Final Clinical Impressions(s) / ED Diagnoses   Final diagnoses:  Foot arch pain, right    New Prescriptions Discharge Medication List as of 04/06/2016  9:23 AM    START taking these medications   Details  HYDROcodone-acetaminophen (NORCO/VICODIN) 5-325 MG tablet Take 1 tablet by mouth every 6 (six) hours as needed., Starting Sun 04/06/2016, Print       Clinical Impression: 1. Foot arch pain, right     Disposition: Discharge  Condition: Good  I have discussed the results, Dx and Tx plan with the pt(& family if present). He/she/they expressed understanding and agree(s) with the plan. Discharge instructions discussed at great length. Strict return precautions discussed and pt &/or family have verbalized understanding of the  instructions. No further questions at time of discharge.    Discharge Medication List as of 04/06/2016  9:23 AM    START taking these medications   Details  HYDROcodone-acetaminophen (NORCO/VICODIN) 5-325 MG tablet Take 1 tablet by mouth every 6 (six) hours as needed., Starting Sun 04/06/2016, Print        Follow Up: [place] [phone]    [place] [address] [id number] mc [employer] [id number]  If symptoms worsen     Courtney Paris, MD 04/07/16 1139

## 2016-04-06 NOTE — Discharge Instructions (Signed)
Please follow-up with your PCP when you get back to [place] this week. Also consider following up with podiatry or a orthopedic team. If symptoms worsen, please return to the nearest ED. Please do not drive on your pain medicine.

## 2016-04-06 NOTE — ED Notes (Signed)
MD at bedside. 

## 2016-04-09 ENCOUNTER — Ambulatory Visit: Payer: BC Managed Care – PPO | Attending: Dermatology | Admitting: Dermatology

## 2016-04-09 ENCOUNTER — Encounter: Payer: Self-pay | Admitting: Hematology & Oncology

## 2016-04-09 ENCOUNTER — Ambulatory Visit: Payer: BC Managed Care – PPO | Admitting: Dermatology

## 2016-04-09 ENCOUNTER — Ambulatory Visit: Payer: BC Managed Care – PPO | Attending: Hematology & Oncology | Admitting: Hematology & Oncology

## 2016-04-09 ENCOUNTER — Encounter: Payer: Self-pay | Admitting: Dermatology

## 2016-04-09 VITALS — BP 133/85 | HR 97 | Temp 99.0°F | Resp 16 | Ht 62.0 in | Wt 128.0 lb

## 2016-04-09 DIAGNOSIS — C4362 Malignant melanoma of left upper limb, including shoulder: Secondary | ICD-10-CM | POA: Insufficient documentation

## 2016-04-09 DIAGNOSIS — Z85820 Personal history of malignant melanoma of skin: Secondary | ICD-10-CM | POA: Insufficient documentation

## 2016-04-09 DIAGNOSIS — Z1283 Encounter for screening for malignant neoplasm of skin: Secondary | ICD-10-CM | POA: Insufficient documentation

## 2016-04-09 DIAGNOSIS — D229 Melanocytic nevi, unspecified: Secondary | ICD-10-CM | POA: Insufficient documentation

## 2016-04-09 DIAGNOSIS — Z87891 Personal history of nicotine dependence: Secondary | ICD-10-CM

## 2016-04-09 NOTE — Progress Notes (Signed)
[facility]  [facility]     CC: Melanoma follow-up, TBSE    HISTORY OF PRESENT ILLNESS  Interval:  Presents today for TBSE on surveillance for melanoma left posterior shoulder. States last TBSE was with Dr.Venna in June 2017, at which time no suspicious lesions were identified warranting biopsy. Today she denies any new, changing, itching, peeling, bleeding, painful lesions. Denies enlarged or tender lymph nodes. States she is going to podiatry following today's visit for plantar fasciitis of right heel.    Detailed:  [name] [name] is 60 y.o. female with a history of Stage 1B Left posterior shoulder. Patient noted a growing mole which became like a "pinkish/purpleish blister"  over the past 5 months, + bleeding. Saw her dermatologist Dr. Christella Noa who recommended excisional biopsy which was performed by Dr. Webb Silversmith on 04/06/14. Pathology showed 1.47 mm thick, non-ulcerated melanoma with 2 mitoses/mm2. Peripheral margin was close with melanoma in situ. She underwent wide local excision and sentinel lymph node biopsy on 05/19/14 which showed no residual melanoma at primary site and two negative left supraclavicular lymph nodes. Pre op CXR and LDH were normal.    No personal/family history of skin cancer. No tanning bed use. No blistering sunburns.    Of note, lipoma (confirmed on biopsy and FISH November 2016) on left lateral thigh was removed.   Current Outpatient Prescriptions   Medication Sig Dispense Refill   . Ascorbic Acid (VITAMIN C) 500 MG tablet Take 500 mg by mouth daily.     Marland Kitchen aspirin EC 81 MG EC tablet Take 81 mg by mouth daily.     Marland Kitchen atorvastatin (LIPITOR) 10 MG tablet Take one tablet by mouth one time daily 30 tablet 0   . Cholecalciferol (VITAMIN D-3) 5000 UNITS TABS Take by mouth daily.         Marland Kitchen HYDROcodone-acetaminophen (NORCO) 5-325 MG per tablet TK 1 T PO Q 6 H PRN  0   . Levothyroxine Sodium 25 MCG Cap Take by mouth daily.     Marland Kitchen  loratadine (CLARITIN) 10 MG tablet Take 10 mg by mouth as needed.        . Omega-3 Fatty Acids (FISH OIL) 1200 MG CAPS Take 1,200 mg by mouth 2 (two) times daily.     . vitamin B-12 (CYANOCOBALAMIN) 1000 MCG tablet Take 1,000 mcg by mouth once a week.       No current facility-administered medications for this visit.      Allergies   Allergen Reactions   . Ciprofloxacin Swelling     Past Medical History:   Diagnosis Date   . Hyperlipidemia    . Lipoma     base of posterior neck   . Low back pain    . Malignant neoplasm of skin     Melanoma on left shoulder back   . Plantar fasciitis, right    . Seasonal allergies      Past Surgical History:   Procedure Laterality Date   . BIOPSY, SENTINEL NODE N/A 05/19/2014    Procedure: BIOPSY, SENTINEL NODE;  Surgeon: Lorenza Chick, MD;  Location: [facility] OR;  Service: General;  Laterality: N/A;   SENTINEL LYMPH NODE BIOSPY W/ NUC MED @ 8:00AM,    . EXCISION, LIPOMA N/A 11/24/2014    Procedure: EXCISION, LIPOMA OF BASE OF NECK;  Surgeon: Lorenza Chick, MD;  Location: [facility];  Service: General;  Laterality: N/A;  BACK LIPOMA EXCISION   . EXCISION, MASS  Left 04/18/2015    Procedure: EXCISION, MASS;  Surgeon: Lorenza Chick, MD;  Location: [facility] OR;  Service: General;  Laterality: Left;  LEFT THIGH MASS EXCISION   . EXCISION, MELANOMA Bilateral 05/19/2014    Procedure: EXCISION, MELANOMA;  Surgeon: Lorenza Chick, MD;  Location: [facility] OR;  Service: General;  Laterality: Bilateral;  LEFT SHOULDER MELANOMA WIDE LOCAL EXCISION, RIGHT MID-BACK ATYPICAL NEVUS EXCISION     . LASIK  2003     Family History   Problem Relation Age of Onset   . Hypertension Father    . Hyperlipidemia Father    . Aneurysm Father    . Heart disease Father      unsure of age   . Asthma Brother    . Hyperlipidemia Brother    . ADD / ADHD Daughter    . Asthma Daughter    . Alcohol abuse Maternal Uncle    . Alcohol abuse Maternal Grandfather    . Breast cancer Maternal Grandmother    .  Cancer Maternal Grandmother      breast   . Thyroid disease Sister      Social History     Social History   . Marital status: Single     Spouse name: N/A   . Number of children: N/A   . Years of education: N/A     Occupational History   . Not on file.     Social History Main Topics   . Smoking status: Former Smoker     Packs/day: 1.00     Years: 35.00     Types: Cigarettes     Quit date: 10/29/2002   . Smokeless tobacco: Never Used   . Alcohol use 0.0 oz/week      Comment: drinks wine nightly (2 glasses); very rare other alcohol   . Drug use: No   . Sexual activity: Yes     Partners: Male     Birth control/ protection: None     Other Topics Concern   . Not on file     Social History Narrative   . No narrative on file       REVIEW OF SYSTEMS  Const - no fever no chills   CV - no chest pain   Resp- no cough   Neuro - no headache   Abd - no pain   Integ - see HPI  Psych - no depression or anxiety   All other systems reviewed and negative      Objective:     Vitals:    04/09/16 1256   BP: 133/85   Pulse: 97   Resp: 16   Temp: 99 F (37.2 C)   SpO2: 99%      Constitutional: General appearance: NAD, conversant    HEENT: normocephalic, atraumatic. Conjunctiva, lids, lips, teeth, gums and oropharynx examined with no suspicious lesions    SKIN:A total body skin examination is completed including:   - palpation of scalp and inspection of hair of scalp, eyebrows, face, chest and extremities   - inspection of eccrine and apocrine glands.   - inspection and/or palpation of the skin and subcutaneous tissues with the following anatomic skin sites examined with the following findings:   1. Head including face - no lesions of concern  2. Neck  - no lesions of concern  3. Chest including breasts, and axillae - no lesions of concern  4. Abdomen - no lesions of concern, uniformly dark brown 4mm minimally elevated  or left abdomen superior and lateral to umbilicous  5. Genitalia, groin, buttocks - no lesions of concern  6. Back -   a. A.  Posterior base of neck-healed scar (excised lipoma site)  7. Right upper extremity - no lesions of concern  8. Left upper extremity -   a. posterior shoulder 6 cm well healed scar (MM site 10/15) no pigmentation or nodulatrity  9. Right lower extremity - no lesions of concern   10. Left lower extremity -   a. Left outer thigh - transverse scar 10cm (lipoma excision)- posterior aspect is a 4mm heart shaped dark brown macule (stable)     - Dermatoscopy was used to evaluate the nevi.   - General skin: multiple brown macules, size 2-29mm, approximately 50 in number, distributed primarily on the trunk. Extensive photodamage and rhytids    LYMPHATIC:Examination of lymph node basins is completed, including the H and N, axilla and groin. There is no lymphadenopathy.    NECK: supple, no masses    CVS: no swelling, varicosities or edema    EXTREMITIES: inspection and palpation of digits and nails revealed no abnormalities    NEURO/PSYCH: alert and oriented, pleasant      Pathology reports   Date 04/06/14  Accession Number [id number])  Location Lt Post Shoulder  Dx MM   Clark Level IV   Thickness 1.42mm   Mitotic Rate 2 per mm   Ulceration Absent   Regression Absent       Assessment & Plan:   60 y.o. female with a history of Stage IB Left posterior shoulder 04/06/14 s/p WLE and negative SLN     1. 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   -Patient seen by Dr. Crosby Oyster today.    2. TBSE   - total body skin examination performed today   - no lesions concerning to warrant biopsy today   - Several have been documented as above and will be followed   - Sun protection reviewed   - ABCDEs of melanoma reviewed   - Monthly self-skin examination was advised and the patient was instructed to return to clinic prior to scheduled visit if any suspicious or worrisome findings are discovered    3. Safe sun practices reviewed  including  -Use of sunscreen and application, need for reapplication  -Discussed exposure through car windows and tinting  -Sun protective clothing including hats, sunglasses and other      RTC in 6 months for TBSE.     Jeannette Corpus, DNP, FNP-BC  Nurse Practitioner  [facility]  [address], [address]  [place], [place] [address]  [phone]  [phone]      Suraj S. Ruel Favors, MD, FAAD  Medical Director, [facility]oma [facility] Center  [facility]  [phone] (t)   [phone] (f)     I personally reviewed the patients history, pathology when relevant, and completed an independent skin examination. I agree with all components of this written note. I have made addendums where/if needed. S. VENNA.

## 2016-04-09 NOTE — Progress Notes (Signed)
[facility]    CC: Melanoma follow-up    HISTORY OF PRESENT ILLNESS  [name] [name] is 60 y.o. female who noted a growing mole in left shoulder which became like "blister" over 5 months. Also noted bleeding from the lesion. Saw her dermatologist Dr. Christella Noa who recommended excisional biopsy which was performed by Dr. Webb Silversmith on 04/06/14. Pathology showed 1.47 mm thick, non-ulcerated melanoma with 2 mitoses/mm2. Peripheral margin was close with melanoma in situ. Preop CXR and LDH were normal. She underwent wide local excision and sentinel lymph node biopsy on 05/19/14 which showed no residual melanoma at primary site and two negative left supraclavicular lymph nodes. She noticed new soft lump in left hip area in August 2016 for which ultrasound showed features most consistent with lipoma. She underwent excision by Dr.Hafner on 04/18/15 which confirmed it to be lipoma.    Last LDH was elevated (265) in March 2017. Repeat LDH in July 2017 was within normal limits (223). CXR 08/22/15 showed no evidence of metastatic disease.     She presents for follow-up on surveillance for melanoma of left posterior shoulder.  Denies enlarged or tender lymph nodes.. Patient denies any rash/itching, fatigue, weight loss, headache, cough, shortness of breath, nausea, vomiting, diarrhea or abdominal pain. States she is going to podiatry following today's visit for plantar fasciitis of right heel.    Current Outpatient Prescriptions   Medication Sig Dispense Refill   . Ascorbic Acid (VITAMIN C) 500 MG tablet Take 500 mg by mouth daily.     Marland Kitchen aspirin EC 81 MG EC tablet Take 81 mg by mouth daily.     Marland Kitchen atorvastatin (LIPITOR) 10 MG tablet Take one tablet by mouth one time daily 30 tablet 0   . Cholecalciferol (VITAMIN D-3) 5000 UNITS TABS Take by mouth daily.         Marland Kitchen HYDROcodone-acetaminophen (NORCO) 5-325 MG per tablet TK 1 T PO Q 6 H PRN  0   . Levothyroxine Sodium 25 MCG Cap Take by mouth daily.     Marland Kitchen  loratadine (CLARITIN) 10 MG tablet Take 10 mg by mouth as needed.        . Omega-3 Fatty Acids (FISH OIL) 1200 MG CAPS Take 1,200 mg by mouth 2 (two) times daily.     . vitamin B-12 (CYANOCOBALAMIN) 1000 MCG tablet Take 1,000 mcg by mouth once a week.       No current facility-administered medications for this visit.      Allergies   Allergen Reactions   . Ciprofloxacin Swelling     Past Medical History:   Diagnosis Date   . Hyperlipidemia    . Lipoma     base of posterior neck   . Low back pain    . Malignant neoplasm of skin     Melanoma on left shoulder back   . Plantar fasciitis, right    . Seasonal allergies      Past Surgical History:   Procedure Laterality Date   . BIOPSY, SENTINEL NODE N/A 05/19/2014    Procedure: BIOPSY, SENTINEL NODE;  Surgeon: Lorenza Chick, MD;  Location: [facility] OR;  Service: General;  Laterality: N/A;   SENTINEL LYMPH NODE BIOSPY W/ NUC MED @ 8:00AM,    . EXCISION, LIPOMA N/A 11/24/2014    Procedure: EXCISION, LIPOMA OF BASE OF NECK;  Surgeon: Lorenza Chick, MD;  Location: [facility];  Service: General;  Laterality: N/A;  BACK LIPOMA EXCISION   . EXCISION,  MASS Left 04/18/2015    Procedure: EXCISION, MASS;  Surgeon: Lorenza Chick, MD;  Location: [facility] OR;  Service: General;  Laterality: Left;  LEFT THIGH MASS EXCISION   . EXCISION, MELANOMA Bilateral 05/19/2014    Procedure: EXCISION, MELANOMA;  Surgeon: Lorenza Chick, MD;  Location: [facility] OR;  Service: General;  Laterality: Bilateral;  LEFT SHOULDER MELANOMA WIDE LOCAL EXCISION, RIGHT MID-BACK ATYPICAL NEVUS EXCISION     . LASIK  2003     Family History   Problem Relation Age of Onset   . Hypertension Father    . Hyperlipidemia Father    . Aneurysm Father    . Heart disease Father      unsure of age   . Asthma Brother    . Hyperlipidemia Brother    . ADD / ADHD Daughter    . Asthma Daughter    . Alcohol abuse Maternal Uncle    . Alcohol abuse Maternal Grandfather    . Breast cancer Maternal Grandmother    .  Cancer Maternal Grandmother      breast   . Thyroid disease Sister      Social History     Social History   . Marital status: Single     Spouse name: N/A   . Number of children: N/A   . Years of education: N/A     Occupational History   . Not on file.     Social History Main Topics   . Smoking status: Former Smoker     Packs/day: 1.00     Years: 35.00     Types: Cigarettes     Quit date: 10/29/2002   . Smokeless tobacco: Never Used   . Alcohol use 0.0 oz/week      Comment: drinks wine nightly (2 glasses); very rare other alcohol   . Drug use: No   . Sexual activity: Yes     Partners: Male     Birth control/ protection: None     Other Topics Concern   . Not on file     Social History Narrative   . No narrative on file       REVIEW OF SYSTEMS  All other systems were reviewed and are negative except as previously noted in the HPI.       Objective:     Vitals:    04/09/16 1347   BP: 133/85   Pulse: 97   Resp: 16   Temp: 99 F (37.2 C)   SpO2: 99%       ECOG PS: 0  General: Vitals Reviewed. NAD  Eyes: Anicteric, normal conjunctiva  ENT: No sores in mouth  CV: RRR  RESP: CTA B  GI: NT/ND, no HSM  Neuro: AAO x 3  MS: no swelling in arms or legs  SKIN: 8 cm wide local excision scar in left shoulder without swelling, bleeding or tenderness. No evidence of satellite or in-transit lesion. 10cm excision scar in left lateral upper thigh  LYMPH: negative cervical suplarclavicular axillary inguinal.     Pathology reports   Date 04/06/14  Accession Number [id number])  Location Lt Post Shoulder  Dx MM   Clark Level IV   Thickness 1.33mm   Mitotic Rate 2 per mm   Ulceration Absent   Regression Absent       Assessment & Plan:   1. Melanoma: Stage 1B Left posterior shoulder, s/p WLE and negative SLNB December 2015   -No clinical evidence of recurrence.   -  Repeat CXR today. If no evidence of metastatic disease, RTC in 1 year.   -Will plan for annual visit with CXR for next 3 years (until December 2020). She will have LDH added to her  annual blood work by her PCP.   -Skin exam by Dr. Ruel Favors today.    RTC in 1 year.    Tiney Rouge, MD   Medical Oncology  Director, Melanoma and Cutaneous Oncology Therapeutics and Research  [facility]  P [phone]

## 2016-05-15 ENCOUNTER — Other Ambulatory Visit: Payer: Self-pay | Admitting: Podiatrist

## 2016-05-15 DIAGNOSIS — M659 Synovitis and tenosynovitis, unspecified: Secondary | ICD-10-CM

## 2016-05-19 ENCOUNTER — Telehealth: Payer: Self-pay

## 2016-05-19 ENCOUNTER — Ambulatory Visit
Admission: RE | Admit: 2016-05-19 | Discharge: 2016-05-19 | Disposition: A | Discharge status: Home / Self Care | Payer: BC Managed Care – PPO | Source: Ambulatory Visit | Attending: Family | Admitting: Family

## 2016-05-19 DIAGNOSIS — C4362 Malignant melanoma of left upper limb, including shoulder: Secondary | ICD-10-CM | POA: Insufficient documentation

## 2016-05-19 NOTE — Telephone Encounter (Signed)
ML to let her know that her CXR result was good and that I will call back to talk to her.

## 2016-05-19 NOTE — Progress Notes (Signed)
Normal chest x-ray. No evidence of metastatic disease. Please call patient with results.     Thank you!

## 2016-05-28 ENCOUNTER — Ambulatory Visit: Payer: BC Managed Care – PPO

## 2016-05-30 ENCOUNTER — Ambulatory Visit
Admission: RE | Admit: 2016-05-30 | Discharge: 2016-05-30 | Disposition: A | Discharge status: Home / Self Care | Payer: BC Managed Care – PPO | Source: Ambulatory Visit | Attending: Podiatrist | Admitting: Podiatrist

## 2016-05-30 DIAGNOSIS — M25871 Other specified joint disorders, right ankle and foot: Secondary | ICD-10-CM | POA: Insufficient documentation

## 2016-05-30 DIAGNOSIS — M65871 Other synovitis and tenosynovitis, right ankle and foot: Secondary | ICD-10-CM | POA: Insufficient documentation

## 2016-05-30 DIAGNOSIS — R600 Localized edema: Secondary | ICD-10-CM | POA: Insufficient documentation

## 2016-05-30 DIAGNOSIS — M659 Synovitis and tenosynovitis, unspecified: Secondary | ICD-10-CM

## 2016-06-19 ENCOUNTER — Other Ambulatory Visit: Payer: Self-pay | Admitting: Podiatrist

## 2016-06-19 DIAGNOSIS — M7989 Other specified soft tissue disorders: Secondary | ICD-10-CM

## 2016-07-03 ENCOUNTER — Ambulatory Visit
Admission: RE | Admit: 2016-07-03 | Discharge: 2016-07-03 | Disposition: A | Discharge status: Home / Self Care | Payer: BC Managed Care – PPO | Source: Ambulatory Visit | Attending: Podiatrist | Admitting: Podiatrist

## 2016-07-03 DIAGNOSIS — M7989 Other specified soft tissue disorders: Secondary | ICD-10-CM

## 2016-07-03 DIAGNOSIS — L988 Other specified disorders of the skin and subcutaneous tissue: Secondary | ICD-10-CM | POA: Insufficient documentation

## 2016-07-03 LAB — WHOLE BLOOD CREATININE WITH GFR POCT
GFR POCT: >60 mL/min/{1.73_m2} (ref >60)
Whole Blood Creatinine POCT: 0.5 mg/dL (ref 0.5–1.1)

## 2016-07-03 MED ORDER — GADOBUTROL 1 MMOL/ML IV SOLN
INTRAVENOUS | Status: AC
Start: 2016-07-03 — End: 2016-07-03
  Administered 2016-07-03: 6 mL via INTRAVENOUS
  Filled 2016-07-03: qty 7.5

## 2016-08-12 ENCOUNTER — Encounter: Payer: Self-pay | Admitting: Family

## 2016-08-12 ENCOUNTER — Ambulatory Visit: Payer: BC Managed Care – PPO | Attending: Family | Admitting: Family

## 2016-08-12 VITALS — BP 149/84 | HR 93 | Temp 98.7°F | Ht 62.0 in | Wt 130.0 lb

## 2016-08-12 DIAGNOSIS — I999 Unspecified disorder of circulatory system: Secondary | ICD-10-CM | POA: Insufficient documentation

## 2016-08-12 DIAGNOSIS — M7989 Other specified soft tissue disorders: Secondary | ICD-10-CM | POA: Insufficient documentation

## 2016-08-12 NOTE — Progress Notes (Signed)
[facility]  [facility]     CC: Spot check, right heel    HISTORY OF PRESENT ILLNESS    Interval:  Presents today for spot check of right heel. States she first noticed lesion following soft cast removal r/t a fractured right foot. States it can be tender at times, but denies current pain. States she saw podiatry (Dr. Leone Payor) who ordered MRI. MRI ankle with contrast 07/03/16 showed enhancing lobular/tubular mass in the plantar and lateral subcutaneous fat adjacent to the posterior calcaneus, nonspecific but suspicious for a vascular mass. Patient states podiatry referred her to Dr. Vivia Birmingham, but was informed he does not see patients with vascular lesions on the foot.     Last total body skin exam (TBSE) was here in November with Dr. Ruel Favors, at which time no concerning lesions were identified. She is scheduled to see me for a TBSE this month.     Detailed past:  Lakeshia Dohner is 61 y.o. female with a history of Stage 1B Left posterior shoulder. Patient noted a growing mole which became like a "pinkish/purpleish blister"  over the past 5 months, + bleeding. Saw her dermatologist Dr. Christella Noa who recommended excisional biopsy which was performed by Dr. Webb Silversmith on 04/06/14. Pathology showed 1.47 mm thick, non-ulcerated melanoma with 2 mitoses/mm2. Peripheral margin was close with melanoma in situ. She underwent wide local excision and sentinel lymph node biopsy on 05/19/14 which showed no residual melanoma at primary site and two negative left supraclavicular lymph nodes. Pre op CXR and LDH were normal.    No personal/family history of skin cancer. No tanning bed use. No blistering sunburns.    Of note, lipoma (confirmed on biopsy and FISH November 2016) on left lateral thigh was removed.   Current Outpatient Prescriptions   Medication Sig Dispense Refill   . Ascorbic Acid (VITAMIN C) 500 MG tablet Take 500 mg by mouth daily.     Marland Kitchen aspirin EC 81 MG EC  tablet Take 81 mg by mouth daily.     Marland Kitchen atorvastatin (LIPITOR) 10 MG tablet Take one tablet by mouth one time daily 30 tablet 0   . Cholecalciferol (VITAMIN D-3) 5000 UNITS TABS Take by mouth daily.         Marland Kitchen HYDROcodone-acetaminophen (NORCO) 5-325 MG per tablet TK 1 T PO Q 6 H PRN  0   . Levothyroxine Sodium 25 MCG Cap Take by mouth daily.     Marland Kitchen loratadine (CLARITIN) 10 MG tablet Take 10 mg by mouth as needed.        . Omega-3 Fatty Acids (FISH OIL) 1200 MG CAPS Take 1,200 mg by mouth 2 (two) times daily.     . vitamin B-12 (CYANOCOBALAMIN) 1000 MCG tablet Take 1,000 mcg by mouth once a week.       No current facility-administered medications for this visit.      Allergies   Allergen Reactions   . Ciprofloxacin Swelling     Past Medical History:   Diagnosis Date   . Hyperlipidemia    . Lipoma     base of posterior neck   . Low back pain    . Malignant neoplasm of skin     Melanoma on left shoulder back   . Plantar fasciitis, right    . Seasonal allergies      Past Surgical History:   Procedure Laterality Date   . BIOPSY, SENTINEL NODE N/A 05/19/2014    Procedure:  BIOPSY, SENTINEL NODE;  Surgeon: Lorenza Chick, MD;  Location: [facility] OR;  Service: General;  Laterality: N/A;   SENTINEL LYMPH NODE BIOSPY W/ NUC MED @ 8:00AM,    . EXCISION, LIPOMA N/A 11/24/2014    Procedure: EXCISION, LIPOMA OF BASE OF NECK;  Surgeon: Lorenza Chick, MD;  Location: [facility];  Service: General;  Laterality: N/A;  BACK LIPOMA EXCISION   . EXCISION, MASS Left 04/18/2015    Procedure: EXCISION, MASS;  Surgeon: Lorenza Chick, MD;  Location: [facility] OR;  Service: General;  Laterality: Left;  LEFT THIGH MASS EXCISION   . EXCISION, MELANOMA Bilateral 05/19/2014    Procedure: EXCISION, MELANOMA;  Surgeon: Lorenza Chick, MD;  Location: [facility] OR;  Service: General;  Laterality: Bilateral;  LEFT SHOULDER MELANOMA WIDE LOCAL EXCISION, RIGHT MID-BACK ATYPICAL NEVUS EXCISION     . LASIK  2003     Family History   Problem  Relation Age of Onset   . Hypertension Father    . Hyperlipidemia Father    . Aneurysm Father    . Heart disease Father      unsure of age   . Asthma Brother    . Hyperlipidemia Brother    . ADD / ADHD Daughter    . Asthma Daughter    . Alcohol abuse Maternal Uncle    . Alcohol abuse Maternal Grandfather    . Breast cancer Maternal Grandmother    . Cancer Maternal Grandmother      breast   . Thyroid disease Sister      Social History     Social History   . Marital status: Single     Spouse name: N/A   . Number of children: N/A   . Years of education: N/A     Occupational History   . Not on file.     Social History Main Topics   . Smoking status: Former Smoker     Packs/day: 1.00     Years: 35.00     Types: Cigarettes     Quit date: 10/29/2002   . Smokeless tobacco: Never Used   . Alcohol use 0.0 oz/week      Comment: drinks wine nightly (2 glasses); very rare other alcohol   . Drug use: No   . Sexual activity: Yes     Partners: Male     Birth control/ protection: None     Other Topics Concern   . Not on file     Social History Narrative   . No narrative on file       REVIEW OF SYSTEMS    Integ - see HPI    Objective:     Vitals:    08/12/16 1448   BP: 149/84   Pulse: 93   Temp: 98.7 F (37.1 C)   SpO2: 99%      Constitutional: General appearance: NAD, conversant    SKIN:  inspection and/or palpation of the skin and subcutaneous tissues with the following anatomic skin sites examined with the following findings:   1. Right heel- 1 x 1.5 cm blanchable soft tissue nodule, non-tender    NEURO/PSYCH: alert and oriented, pleasant      Pathology reports   Date 04/06/14  Accession Number [id number])  Location Lt Post Shoulder  Dx MM   Clark Level IV   Thickness 1.31mm   Mitotic Rate 2 per mm   Ulceration Absent   Regression Absent     Radiology report  MRI ANKLE RIGHT W CONTRAST  DOS: 07/03/16    CLINICAL INDICATION:   Soft tissue mass    COMPARISON: 05/30/2016    TECHNIQUE: Sagittal, axial, and coronal T1  fat-suppressed imaging of the  right ankle following intravenous administration of approximately 6 cc  of Gadavist.    FINDINGS:      Previously described multilobular T2 hyperintense lesion within the  lateral subcutaneous fat adjacent to the posterior calcaneus  demonstrates a thick rim of peripheral enhancement with 3 nodular foci  of hypoenhancement. There is associated tubular appearing enhancement  which tracks from this lesion along the lateral and plantar subcutaneous  fat beneath the plantar fascial origin. This is nonspecific but  suspicious for a vascular mass. There is nonspecific enhancement within  the surrounding subcutaneous fat.    IMPRESSION:    Enhancing lobular/tubular mass in the plantar and lateral  subcutaneous fat adjacent to the posterior calcaneus corresponding to  the previously described T2 hyperintense lesion is nonspecific but  suspicious for a vascular mass. Further characterization with Doppler  ultrasound is recommended.    MRI ANKLE RIGHT WO CONTRAST  DOS: 05/30/16  INDICATION: Pain and swelling of the posterior lateral ankle. Evaluate  for tenosynovitis and/or occult fracture.    TECHNIQUE: Multiplanar, multisequence MRI of the right ankle was  performed without IV contrast.    COMPARISON: None.    FINDINGS:    Bone: No evidence of acute fracture. No osteochondral lesions are seen.    Tendons: The anterior extensor and posterior flexor tendons are intact.     The peroneus longus and brevis are intact. Trace fluid along the  peroneus longus and brevis tendon sheaths within physiologic range. The  Achilles tendon appears normal.    Ligaments: The anterior talofibular, calcaneofibular and posterior  talofibular ligaments are intact.  The anterior and posterior  tibiofibular ligaments are intact.  Deltoid and spring ligaments are  intact.    Miscellaneous: A multilobular T2 hyperintense lesion within the  posterolateral soft tissues adjacent to the dorsal calcaneus  subjacent  to the marker measures 16 x 10 x 8 mm and is nonspecific but may  represent a vascular or cystic lesion. Mild heel fat pad edema noted.   Plantar fascia is intact. Tarsal tunnel and Sinus tarsi are unremarkable  in appearance.  Diffuse subcutaneous edema about the ankle.    IMPRESSION:      1. No evidence of internal derangement or acute fracture.  2. Circumscribed lesion in the posterolateral tissues adjacent to the  dorsal calcaneus measuring 16 x 10 x 8 mm, nonspecific but may represent  a vascular or cystic lesion. Consider baseline contrast-enhanced exam  for further evaluation.  3. Diffuse subcutaneous edema.    Assessment & Plan:   60 y.o. female with a history of Stage IB Left posterior shoulder 04/06/14 s/p WLE and negative SLNB. Presents for spot check of right heel    1. Right heel soft tissue nodule   -No concern for skin cancer. Patient reassured.   -Appearance (lesion completely blanches) and MRI results c/w vascular lesion  -Patient instructed to follow-up with PCP Kipp Laurence, PA-C) to order U/S if warranted.  -Patient provided with printed list of [facility] surgeons if she would like to pursue excision of lesion.     Patient to RTC in 2 weeks for melanoma follow-up and Total body skin examination (TBSE)    Jeannette Corpus, DNP, FNP-BC  Nurse Practitioner  [facility]  [address], [address]  [place], [place] [address]  [phone]  [phone]

## 2016-08-22 ENCOUNTER — Ambulatory Visit: Payer: BC Managed Care – PPO | Admitting: Family

## 2016-08-29 ENCOUNTER — Encounter: Payer: Self-pay | Admitting: Family

## 2016-08-29 ENCOUNTER — Ambulatory Visit: Payer: BC Managed Care – PPO | Attending: Family | Admitting: Family

## 2016-08-29 VITALS — BP 141/87 | HR 85 | Temp 98.6°F | Ht 62.0 in | Wt 130.0 lb

## 2016-08-29 DIAGNOSIS — Z1283 Encounter for screening for malignant neoplasm of skin: Secondary | ICD-10-CM | POA: Insufficient documentation

## 2016-08-29 DIAGNOSIS — D229 Melanocytic nevi, unspecified: Secondary | ICD-10-CM | POA: Insufficient documentation

## 2016-08-29 DIAGNOSIS — Z85820 Personal history of malignant melanoma of skin: Secondary | ICD-10-CM

## 2016-08-29 NOTE — Progress Notes (Signed)
[name]    CC: Melanoma follow-up, Total body skin examination (TBSE)    HISTORY OF PRESENT ILLNESS  Interval:  Presents today for TBSE on surveillance for melanoma left posterior shoulder. States last TBSE was with Dr.Venna in November 2017, at which time no suspicious lesions were identified warranting biopsy. Today she denies any new, changing, itching, peeling, bleeding, painful lesions. Denies enlarged or tender lymph nodes. States she feels well.    I also saw patient on 08/12/16 for spot check of vascular lesion on right heel. States she first noticed lesion following soft cast removal r/t a fractured right foot. States it can be tender at times, but denies current pain. States she saw podiatry (Dr. Leone Payor) who ordered MRI. MRI ankle with contrast 07/03/16 showed enhancing lobular/tubular mass in the plantar and lateral subcutaneous fat adjacent to the posterior calcaneus, nonspecific but suspicious for a vascular mass. Patient states podiatry referred her to Dr. Vivia Birmingham, but was informed he does not see patients with vascular lesions on the foot. Patient to follow-up with PCP next week regarding lesion. She states she will also follow up with podiatry.    Detailed:  [name] [name] is 61 y.o. female with a history of Stage 1B Left posterior shoulder. Patient noted a growing mole which became like a "pinkish/purpleish blister"  over the past 5 months, + bleeding. Saw her dermatologist Dr. Christella Noa who recommended excisional biopsy which was performed by Dr. Webb Silversmith on 04/06/14. Pathology showed 1.47 mm thick, non-ulcerated melanoma with 2 mitoses/mm2. Peripheral margin was close with melanoma in situ. She underwent wide local excision and sentinel lymph node biopsy on 05/19/14 which showed no residual melanoma at primary site and two negative left supraclavicular lymph nodes. Pre op CXR and LDH were normal.    No personal/family history of skin cancer. No tanning bed  use. No blistering sunburns.    Of note, lipoma (confirmed on biopsy and FISH November 2016) on left lateral thigh was removed.   Current Outpatient Prescriptions   Medication Sig Dispense Refill   . Ascorbic Acid (VITAMIN C) 500 MG tablet Take 500 mg by mouth daily.     Marland Kitchen aspirin EC 81 MG EC tablet Take 81 mg by mouth daily.     Marland Kitchen atorvastatin (LIPITOR) 10 MG tablet Take one tablet by mouth one time daily 30 tablet 0   . Cholecalciferol (VITAMIN D-3) 5000 UNITS TABS Take by mouth daily.         Marland Kitchen HYDROcodone-acetaminophen (NORCO) 5-325 MG per tablet TK 1 T PO Q 6 H PRN  0   . Levothyroxine Sodium 25 MCG Cap Take by mouth daily.     Marland Kitchen loratadine (CLARITIN) 10 MG tablet Take 10 mg by mouth as needed.        . Omega-3 Fatty Acids (FISH OIL) 1200 MG CAPS Take 1,200 mg by mouth 2 (two) times daily.     . vitamin B-12 (CYANOCOBALAMIN) 1000 MCG tablet Take 1,000 mcg by mouth once a week.       No current facility-administered medications for this visit.      Allergies   Allergen Reactions   . Ciprofloxacin Swelling     Past Medical History:   Diagnosis Date   . Hyperlipidemia    . Lipoma     base of posterior neck   . Low back pain    . Malignant neoplasm of skin     Melanoma on left shoulder back   .  Plantar fasciitis, right    . Seasonal allergies      Past Surgical History:   Procedure Laterality Date   . BIOPSY, SENTINEL NODE N/A 05/19/2014    Procedure: BIOPSY, SENTINEL NODE;  Surgeon: Lorenza Chick, MD;  Location: [facility] OR;  Service: General;  Laterality: N/A;   SENTINEL LYMPH NODE BIOSPY W/ NUC MED @ 8:00AM,    . EXCISION, LIPOMA N/A 11/24/2014    Procedure: EXCISION, LIPOMA OF BASE OF NECK;  Surgeon: Lorenza Chick, MD;  Location:  [facility];  Service: General;  Laterality: N/A;  BACK LIPOMA EXCISION   . EXCISION, MASS Left 04/18/2015    Procedure: EXCISION, MASS;  Surgeon: Lorenza Chick, MD;  [facility];  Service: General;  Laterality: Left;  LEFT THIGH MASS EXCISION   . EXCISION,  MELANOMA Bilateral 05/19/2014    Procedure: EXCISION, MELANOMA;  Surgeon: Lorenza Chick, MD;  Location:  [facility];  Service: General;  Laterality: Bilateral;  LEFT SHOULDER MELANOMA WIDE LOCAL EXCISION, RIGHT MID-BACK ATYPICAL NEVUS EXCISION     . LASIK  2003     Family History   Problem Relation Age of Onset   . Hypertension Father    . Hyperlipidemia Father    . Aneurysm Father    . Heart disease Father      unsure of age   . Asthma Brother    . Hyperlipidemia Brother    . ADD / ADHD Daughter    . Asthma Daughter    . Alcohol abuse Maternal Uncle    . Alcohol abuse Maternal Grandfather    . Breast cancer Maternal Grandmother    . Cancer Maternal Grandmother      breast   . Thyroid disease Sister      Social History     Social History   . Marital status: Single     Spouse name: N/A   . Number of children: N/A   . Years of education: N/A     Occupational History   . Not on file.     Social History Main Topics   . Smoking status: Former Smoker     Packs/day: 1.00     Years: 35.00     Types: Cigarettes     Quit date: 10/29/2002   . Smokeless tobacco: Never Used   . Alcohol use 0.0 oz/week      Comment: drinks wine nightly (2 glasses); very rare other alcohol   . Drug use: No   . Sexual activity: Yes     Partners: Male     Birth control/ protection: None     Other Topics Concern   . Not on file     Social History Narrative   . No narrative on file       REVIEW OF SYSTEMS  Const - no fever no chills   CV - no chest pain   Resp- no cough   Neuro - no headache   Abd - no pain   Integ - see HPI  Psych - no depression or anxiety   All other systems reviewed and negative      Objective:     Vitals:    08/29/16 1422   BP: 141/87   Pulse: 85   Temp: 98.6 F (37 C)   SpO2: 98%      Constitutional: General appearance: NAD, conversant    HEENT: normocephalic, atraumatic. Conjunctiva, lids, lips, teeth, gums and oropharynx examined with no suspicious lesions  SKIN:A total body skin examination is completed including:   -  palpation of scalp and inspection of hair of scalp, eyebrows, face, chest and extremities   - inspection of eccrine and apocrine glands.   - inspection and/or palpation of the skin and subcutaneous tissues with the following anatomic skin sites examined with the following findings:   1. Head including face - no lesions of concern  2. Neck  - no lesions of concern  3. Chest including breasts, and axillae - no lesions of concern  4. Abdomen -   a. Uniformly dark brown 4mm minimally elevated on left abdomen superior and lateral to umbilicus (stable)  5. Genitalia, groin, buttocks - no lesions of concern  6. Back -   a. Posterior base of neck-healed scar (excised lipoma site)  7. Right upper extremity -   a. Right axilla- 3 x 2 mm brown macule (long standing and unchanged per patient, continue to monitor)  8. Left upper extremity -   a. posterior shoulder 6 cm well healed scar (MM site 10/15) no pigmentation or nodulatrity  9. Right lower extremity -  a.         Right heel- 1 x 1.5 cm blanchable soft tissue nodule, non-tender  10. Left lower extremity -   a. Left outer thigh - transverse scar 10cm (lipoma excision)- posterior aspect is a 4mm heart shaped dark brown macule (stable)     - Dermatoscopy was used to evaluate the nevi.   - General skin: multiple brown macules, size 2-24mm, approximately 50 in number, distributed primarily on the trunk. Extensive photodamage and rhytids    LYMPHATIC:Examination of lymph node basins is completed, including the H and N, axilla and groin. There is no lymphadenopathy.    NECK: supple, no masses    CVS: no swelling, varicosities or edema    EXTREMITIES: inspection and palpation of digits and nails revealed no abnormalities    NEURO/PSYCH: alert and oriented, pleasant      Pathology reports   Date 04/06/14  Accession Number [id number])  Location Lt Post Shoulder  Dx MM   Clark Level IV   Thickness 1.52mm   Mitotic Rate 2 per mm   Ulceration Absent   Regression Absent        Assessment & Plan:   61 y.o. female with a history of Stage IB Left posterior shoulder 04/06/14 s/p WLE and negative SLN     1. 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     2. TBSE   - total body skin examination performed today   - no lesions concerning to warrant biopsy today   - Several have been documented as above and will be followed   - Sun protection reviewed   - ABCDEs of melanoma reviewed   - Monthly self-skin examination was advised and the patient was instructed to return to clinic prior to scheduled visit if any suspicious or worrisome findings are discovered    3. Right heel soft tissue nodule   -Appearance (lesion completely blanches) and MRI results c/w vascular lesion  -Patient following up with PCP Kipp Laurence, PA-C) next week. Patient states she will also follow up with podiatry.    RTC in 6 months for TBSE.       Jeannette Corpus, DNP, FNP-BC  Nurse Practitioner  [facility]  [address],  [address]  [place], [place] [address]  [phone]  [phone]

## 2016-11-29 ENCOUNTER — Telehealth: Payer: BC Managed Care – PPO

## 2016-11-29 NOTE — Pre-Procedure Instructions (Signed)
   No PST ordered/required

## 2016-12-04 ENCOUNTER — Encounter
Admission: RE | Disposition: A | Discharge status: Home / Self Care | Payer: Self-pay | Source: Ambulatory Visit | Attending: Foot & Ankle Surgery

## 2016-12-04 ENCOUNTER — Ambulatory Visit
Admission: RE | Admit: 2016-12-04 | Discharge: 2016-12-04 | Disposition: A | Discharge status: Home / Self Care | Payer: BC Managed Care – PPO | Source: Ambulatory Visit | Attending: Foot & Ankle Surgery | Admitting: Foot & Ankle Surgery

## 2016-12-04 ENCOUNTER — Ambulatory Visit: Payer: Self-pay

## 2016-12-04 ENCOUNTER — Ambulatory Visit: Payer: BC Managed Care – PPO | Admitting: Critical Care Medicine

## 2016-12-04 DIAGNOSIS — M109 Gout, unspecified: Secondary | ICD-10-CM | POA: Insufficient documentation

## 2016-12-04 DIAGNOSIS — Z85820 Personal history of malignant melanoma of skin: Secondary | ICD-10-CM | POA: Insufficient documentation

## 2016-12-04 DIAGNOSIS — Z7982 Long term (current) use of aspirin: Secondary | ICD-10-CM | POA: Insufficient documentation

## 2016-12-04 DIAGNOSIS — M1A9XX1 Chronic gout, unspecified, with tophus (tophi): Secondary | ICD-10-CM

## 2016-12-04 DIAGNOSIS — E785 Hyperlipidemia, unspecified: Secondary | ICD-10-CM | POA: Insufficient documentation

## 2016-12-04 HISTORY — PX: EXCISION, FOOT LESION: SHX3969

## 2016-12-04 SURGERY — EXCISION, FOOT LESION
Anesthesia: Anesthesia General | Site: Foot | Laterality: Right | Wound class: Clean

## 2016-12-04 MED ORDER — KETOROLAC TROMETHAMINE 30 MG/ML IJ SOLN
INTRAMUSCULAR | Status: DC | PRN
Start: 2016-12-04 — End: 2016-12-04
  Administered 2016-12-04: 30 mg via INTRAVENOUS

## 2016-12-04 MED ORDER — FENTANYL CITRATE (PF) 50 MCG/ML IJ SOLN (WRAP)
25.0000 ug | INTRAMUSCULAR | Status: DC | PRN
Start: 2016-12-04 — End: 2016-12-04

## 2016-12-04 MED ORDER — ONDANSETRON HCL 4 MG/2ML IJ SOLN
4.0000 mg | Freq: Once | INTRAMUSCULAR | Status: DC | PRN
Start: 2016-12-04 — End: 2016-12-04

## 2016-12-04 MED ORDER — PROPOFOL 10 MG/ML IV EMUL (WRAP)
INTRAVENOUS | Status: DC | PRN
Start: 2016-12-04 — End: 2016-12-04
  Administered 2016-12-04 (×3): 50 mg via INTRAVENOUS

## 2016-12-04 MED ORDER — GLYCOPYRROLATE 0.2 MG/ML IJ SOLN
INTRAMUSCULAR | Status: AC
Start: 2016-12-04 — End: ?
  Filled 2016-12-04: qty 1

## 2016-12-04 MED ORDER — ONDANSETRON HCL 4 MG/2ML IJ SOLN
INTRAMUSCULAR | Status: DC | PRN
Start: 2016-12-04 — End: 2016-12-04
  Administered 2016-12-04: 4 mg via INTRAVENOUS

## 2016-12-04 MED ORDER — CEFAZOLIN 1 GM MBP (CNR)
Status: AC
Start: 2016-12-04 — End: 2016-12-04
  Filled 2016-12-04: qty 100

## 2016-12-04 MED ORDER — MIDAZOLAM HCL 2 MG/2ML IJ SOLN
INTRAMUSCULAR | Status: DC | PRN
Start: 2016-12-04 — End: 2016-12-04
  Administered 2016-12-04: 2 mg via INTRAVENOUS

## 2016-12-04 MED ORDER — LACTATED RINGERS IV SOLN
50.0000 mL/h | INTRAVENOUS | Status: DC
Start: 2016-12-04 — End: 2016-12-04

## 2016-12-04 MED ORDER — FAMOTIDINE 10 MG/ML IV SOLN (WRAP)
INTRAVENOUS | Status: DC | PRN
Start: 2016-12-04 — End: 2016-12-04
  Administered 2016-12-04: 20 mg via INTRAVENOUS

## 2016-12-04 MED ORDER — GLYCOPYRROLATE 0.2 MG/ML IJ SOLN
INTRAMUSCULAR | Status: DC | PRN
Start: 2016-12-04 — End: 2016-12-04
  Administered 2016-12-04: .1 mg via INTRAVENOUS

## 2016-12-04 MED ORDER — FENTANYL CITRATE (PF) 50 MCG/ML IJ SOLN (WRAP)
INTRAMUSCULAR | Status: AC
Start: 2016-12-04 — End: ?
  Filled 2016-12-04: qty 2

## 2016-12-04 MED ORDER — BACITRACIN 50000 UNITS IM SOLR
INTRAMUSCULAR | Status: DC | PRN
Start: 2016-12-04 — End: 2016-12-04

## 2016-12-04 MED ORDER — PROPOFOL INFUSION 10 MG/ML
INTRAVENOUS | Status: DC | PRN
Start: 2016-12-04 — End: 2016-12-04
  Administered 2016-12-04: 125 ug/kg/min via INTRAVENOUS

## 2016-12-04 MED ORDER — FENTANYL CITRATE (PF) 50 MCG/ML IJ SOLN (WRAP)
INTRAMUSCULAR | Status: DC | PRN
Start: 2016-12-04 — End: 2016-12-04
  Administered 2016-12-04 (×2): 25 ug via INTRAVENOUS

## 2016-12-04 MED ORDER — DEXAMETHASONE SODIUM PHOSPHATE 4 MG/ML IJ SOLN (WRAP)
INTRAMUSCULAR | Status: DC | PRN
Start: 2016-12-04 — End: 2016-12-04
  Administered 2016-12-04: 4 mg via INTRAVENOUS

## 2016-12-04 MED ORDER — LIDOCAINE HCL 1 % IJ SOLN
INTRAMUSCULAR | Status: DC | PRN
Start: 2016-12-04 — End: 2016-12-04
  Administered 2016-12-04: 4 mL
  Administered 2016-12-04: 5 mL

## 2016-12-04 MED ORDER — PROMETHAZINE HCL 25 MG/ML IJ SOLN
6.2500 mg | Freq: Once | INTRAMUSCULAR | Status: DC | PRN
Start: 2016-12-04 — End: 2016-12-04

## 2016-12-04 MED ORDER — HYDROMORPHONE HCL 0.5 MG/0.5 ML IJ SOLN
0.5000 mg | INTRAMUSCULAR | Status: DC | PRN
Start: 2016-12-04 — End: 2016-12-04

## 2016-12-04 MED ORDER — CEFAZOLIN 1 GM MBP (CNR)
1.0000 g | Status: AC
Start: 2016-12-04 — End: 2016-12-04
  Administered 2016-12-04: 1 g via INTRAVENOUS

## 2016-12-04 MED ORDER — OXYCODONE HCL 5 MG PO TABS
5.0000 mg | ORAL_TABLET | Freq: Once | ORAL | Status: DC | PRN
Start: 2016-12-04 — End: 2016-12-04

## 2016-12-04 MED ORDER — DEXAMETHASONE SODIUM PHOSPHATE 20 MG/5ML IJ SOLN
INTRAMUSCULAR | Status: AC
Start: 2016-12-04 — End: ?
  Filled 2016-12-04: qty 5

## 2016-12-04 MED ORDER — KETOROLAC TROMETHAMINE 60 MG/2ML IM SOLN
INTRAMUSCULAR | Status: AC
Start: 2016-12-04 — End: ?
  Filled 2016-12-04: qty 2

## 2016-12-04 MED ORDER — FAMOTIDINE 20 MG/2ML IV SOLN
INTRAVENOUS | Status: AC
Start: 2016-12-04 — End: ?
  Filled 2016-12-04: qty 2

## 2016-12-04 MED ORDER — LACTATED RINGERS IV SOLN
INTRAVENOUS | Status: DC
Start: 2016-12-04 — End: 2016-12-04
  Administered 2016-12-04: 1000 mL via INTRAVENOUS

## 2016-12-04 MED ORDER — DIPHENHYDRAMINE HCL 50 MG/ML IJ SOLN
6.2500 mg | Freq: Four times a day (QID) | INTRAMUSCULAR | Status: DC | PRN
Start: 2016-12-04 — End: 2016-12-04

## 2016-12-04 MED ORDER — MIDAZOLAM HCL 2 MG/2ML IJ SOLN
INTRAMUSCULAR | Status: AC
Start: 2016-12-04 — End: ?
  Filled 2016-12-04: qty 2

## 2016-12-04 MED ORDER — ONDANSETRON HCL 4 MG/2ML IJ SOLN
INTRAMUSCULAR | Status: AC
Start: 2016-12-04 — End: ?
  Filled 2016-12-04: qty 2

## 2016-12-04 MED ORDER — BUPIVACAINE HCL 0.5 % IJ SOLN
INTRAMUSCULAR | Status: DC | PRN
Start: 2016-12-04 — End: 2016-12-04
  Administered 2016-12-04: 4 mL
  Administered 2016-12-04: 5 mL

## 2016-12-04 MED ORDER — LIDOCAINE HCL (PF) 2 % IJ SOLN
INTRAMUSCULAR | Status: DC | PRN
Start: 2016-12-04 — End: 2016-12-04
  Administered 2016-12-04: 60 mg via INTRAVENOUS

## 2016-12-04 MED ORDER — LIDOCAINE HCL (PF) 2 % IJ SOLN
INTRAMUSCULAR | Status: AC
Start: 2016-12-04 — End: ?
  Filled 2016-12-04: qty 5

## 2016-12-04 MED ORDER — PROPOFOL 10 MG/ML IV EMUL (WRAP)
INTRAVENOUS | Status: AC
Start: 2016-12-04 — End: ?
  Filled 2016-12-04: qty 20

## 2016-12-04 SURGICAL SUPPLY — 49 items
APPLCATOR CHLORAPREP 26ML (Prep) ×6 IMPLANT
BANDAGE ACE NONSTERILE 4IN LF (Bandage) ×1
BANDAGE CMPR PLSTR CTTN CRTY CNFRM 75X4 (Bandage) ×2
BANDAGE CMPR PLSTR CTTN PRCR 5YDX4IN LF (Procedure Accessories) ×1
BANDAGE COMPRESSION L5 YD X W4 IN ELASTIC HOOK LOOP CLOSURE STRETCH (Bandage) ×1 IMPLANT
BANDAGE CURITY CONFORM COMPRESSION L75 IN X W4 IN 1 PLY HIGH ABSORBENT (Bandage) ×1 IMPLANT
BANDAGE ESMARK 4INX9FT NLTX (Procedure Accessories) ×2 IMPLANT
BANDAGE KERLIX MEDIUM GAUZE L3.6 YD X (Dressing) ×1 IMPLANT
BANDAGE MEDLINE COMPRESSION L5 YD X W4 (Bandage) ×1
BANDAGE PROCARE COMP L5 YD X W4 IN 2 CLIP FASTENER SLF CLSR PLSTR CTTN (Procedure Accessories) ×1 IMPLANT
BANDAGE PROCARE COMPRESSION L5 YD X W4 (Procedure Accessories) ×1 IMPLANT
BNDG KRLX GZE 3.6YDX6.4IN MED CTTN 6 PLY (Dressing) ×1
CATH IV 14GX1.75IN NLTX (IV Supply) ×4 IMPLANT
DRAPE SRG PE STRDRP 17X11IN LF STRL ADH (Drape) ×2
DRAPE SURGICAL ADHESIVE STRIP SMALL (Drape) ×1
DRAPE SURGICAL ADHESIVE STRIP SMALL TOWEL MATTE FINISH L17 IN X W11 IN (Drape) ×1 IMPLANT
DRESSING SRG DERMACEA 3X3IN LF STRL NADH (Dressing) ×2
DRESSING SRGCL 3X3IN DERMACEA NONADHERENT LF STRL DSPSBL (Dressing) ×1 IMPLANT
DRESSING WND FBRC CRTY 8X3IN LF STRL (Dressing) ×1
DRESSING WOUND CURITY L8 IN X W3 IN (Dressing) ×1 IMPLANT
ELECTRODE PAD BOVIE GROUNDING REM POLYHESIVE II 9FT E7506 (Procedure Accessories) ×1 IMPLANT
GAUZE KERLIX 4.5X4YDS (Dressing) ×2 IMPLANT
GLOVE SRG NTR RBR 8 INDCTR BGL 299X103MM (Glove) ×1
GLOVE SURG BIOGEL MICRO SZ7.5 (Glove) ×4 IMPLANT
GLOVE SURGICAL 8 INDICATOR BIOGEL POWDER (Glove) ×1 IMPLANT
GLOVE SURGICAL 8 INDICATOR BIOGEL POWDER FREE SMOOTH BEAD CUFF (Glove) ×1 IMPLANT
PAD ELECTROSURG GRND NON REM (Procedure Accessories) ×2
PADDING CAST L4 YD X W4 IN UNDERCAST (Cast) ×1
PADDING CAST L4 YD X W4 IN UNDERCAST MILD STRETCH COHESIVE REGULAR (Cast) ×1 IMPLANT
PADDING CST CTTN WBRL 4YDX4IN LF STRL (Cast) ×1
SLEEVE SEQUEN COMP KNEE REG (Procedure Accessories) ×2 IMPLANT
SPONGE GAUZE L4 IN X W4 IN 16 PLY (Dressing) ×1
SPONGE GAUZE L4 IN X W4 IN 16 PLY (Sponge) ×1 IMPLANT
SPONGE GAUZE L4 IN X W4 IN 16 PLY MAXIMUM ABSORBENT TRAY CURITY (Sponge) ×1 IMPLANT
SPONGE GAUZE L4 IN X W4 IN 16 PLY MAXIMUM ABSORBENT USP TYPE VII (Dressing) ×1 IMPLANT
SPONGE GZE CTTN CRTY 4X4IN LF NS 16 PLY (Dressing) ×1
SPONGE GZE PLS CTTN CRTY 4X4IN LF STRL (Sponge) ×1
STRIP SKIN CLOSURE L3 IN X W.25 IN (Dressing) ×1
STRIP SKIN CLOSURE L3 IN X W.25 IN REINFORCE STERI-STRIP POLYESTER (Dressing) ×1 IMPLANT
STRIP SKIN CLOSURE L4 IN X W1/4 IN (Dressing) ×1
STRIP SKIN CLOSURE L4 IN X W1/4 IN REINFORCE STERI-STRIP POLYESTER (Dressing) ×1 IMPLANT
STRIP SKNCLS PLSTR STRSTRP 3X.25IN LF (Dressing) ×1
STRIP STRSTRP SKNCLS 4X.25IN PLSTR REINF (Dressing) ×1
SYRINGE 10CC COLEMN LUER LOCK (Syringes, Needles) ×2 IMPLANT
SYRINGE 20 ML BD LUER-LOK MEDICAL (Syringes, Needles) ×2 IMPLANT
SYRINGE MED 20ML LL LF STRL (Syringes, Needles) ×4
TOURNIQUET 18IN STRL (Procedure Accessories) ×2 IMPLANT
TOWEL STERILE 6-PACK (Procedure Accessories) ×2 IMPLANT
TRAY EXTREMITY PACK (Pack) ×2 IMPLANT

## 2016-12-04 NOTE — Anesthesia Preprocedure Evaluation (Addendum)
Anesthesia Evaluation    AIRWAY    Mallampati: II    TM distance: >3 FB  Neck ROM: full  Mouth Opening:full   CARDIOVASCULAR    cardiovascular exam normal       DENTAL    no notable dental hx     PULMONARY    pulmonary exam normal     OTHER FINDINGS              Relevant Problems   No relevant active problems               Anesthesia Plan    ASA 2     general                     intravenous induction   Detailed anesthesia plan: general LMA        Post op pain management: per surgeon    informed consent obtained                 Preoperative Chart Review    61 y.o.  female      Wt Readings from Last 3 Encounters:   08/29/16 59 kg (130 lb)   08/12/16 59 kg (130 lb)   04/09/16 58.1 kg (128 lb)     Temp Readings from Last 3 Encounters:   08/29/16 37 C (98.6 F) (Oral)   08/12/16 37.1 C (98.7 F) (Oral)   04/09/16 37.2 C (99 F) (Oral)     BP Readings from Last 3 Encounters:   08/29/16 141/87   08/12/16 149/84   04/09/16 133/85     Pulse Readings from Last 3 Encounters:   08/29/16 85   08/12/16 93   04/09/16 97        Allergies:  Allergies   Allergen Reactions   . Ciprofloxacin Swelling       Outpatient Meds:  No current facility-administered medications on file prior to encounter.      Current Outpatient Prescriptions on File Prior to Encounter   Medication Sig Dispense Refill   . aspirin EC 81 MG EC tablet Take 81 mg by mouth daily.     Marland Kitchen atorvastatin (LIPITOR) 10 MG tablet Take one tablet by mouth one time daily 30 tablet 0   . Levothyroxine Sodium 25 MCG Cap Take by mouth daily.     Marland Kitchen loratadine (CLARITIN) 10 MG tablet Take 10 mg by mouth as needed.        . Ascorbic Acid (VITAMIN C) 500 MG tablet Take 500 mg by mouth daily.     . Cholecalciferol (VITAMIN D-3) 5000 UNITS TABS Take by mouth daily.         Marland Kitchen HYDROcodone-acetaminophen (NORCO) 5-325 MG per tablet TK 1 T PO Q 6 H PRN  0   . Omega-3 Fatty Acids (FISH OIL) 1200 MG CAPS Take 1,200 mg by mouth 2 (two) times daily.     . vitamin B-12 (CYANOCOBALAMIN) 1000  MCG tablet Take 1,000 mcg by mouth once a week.         Inpatient Meds:  Scheduled Meds:  Continuous Infusions:  PRN Meds:.    Problem List:  Patient Active Problem List    Diagnosis Date Noted   . Lipoma 11/24/2014   . Skin lesion    . 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 Low vitamin B12 level 03/15/2014   . Alcohol use disorder 03/12/2014   . Macrocytosis 03/12/2014   . Subclinical hypothyroidism 12/14/2012   .  Elevated blood pressure reading without diagnosis of hypertension 12/14/2012   . Plantar fasciitis, bilateral 12/14/2012   . Hypertriglyceridemia 10/28/2012   . Hyperlipidemia with target LDL less than 100 10/28/2012   . Allergy 10/28/2012       History:  Past Medical History:   Diagnosis Date   . Claustrophobia    . Hyperlipidemia    . Lipoma     base of posterior neck   . Low back pain    . Malignant neoplasm of skin     Melanoma on left shoulder back   . Plantar fasciitis, right    . Rash    . Seasonal allergies      Past Surgical History:   Procedure Laterality Date   . BIOPSY, SENTINEL NODE N/A 05/19/2014    Procedure: BIOPSY, SENTINEL NODE;  Surgeon: Lorenza Chick, MD;  Location: [facility] OR;  Service: General;  Laterality: N/A;   SENTINEL LYMPH NODE BIOSPY W/ NUC MED @ 8:00AM,    . EXCISION, LIPOMA N/A 11/24/2014    Procedure: EXCISION, LIPOMA OF BASE OF NECK;  Surgeon: Lorenza Chick, MD;  Location: [facility];  Service: General;  Laterality: N/A;  BACK LIPOMA EXCISION   . EXCISION, MASS Left 04/18/2015    Procedure: EXCISION, MASS;  Surgeon: Lorenza Chick, MD;  Location: [facility];  Service: General;  Laterality: Left;  LEFT THIGH MASS EXCISION   . EXCISION, MELANOMA Bilateral 05/19/2014    Procedure: EXCISION, MELANOMA;  Surgeon: Lorenza Chick, MD;  Location: [facility] OR;  Service: General;  Laterality: Bilateral;  LEFT SHOULDER MELANOMA WIDE LOCAL EXCISION, RIGHT MID-BACK ATYPICAL NEVUS EXCISION     . EYE SURGERY     . LASIK  2003   . SKIN BIOPSY          Labs    Lab Results   Component Value Date    WBC 6.50 11/22/2014    HGB 12.8 11/22/2014    HCT 40.1 11/22/2014    PLT 197 11/22/2014    ALT 30 11/22/2014    AST 28 11/22/2014    NA 142 11/22/2014    K 4.2 11/22/2014    CL 104 11/22/2014    CO2 27 11/22/2014    CREAT 0.7 11/22/2014    BUN 9.0 11/22/2014    TSH 4.49 11/22/2014    PT 13.6 12/16/2013    PTT 24 12/16/2013    INR 1.1 12/16/2013    GLU 107 (H) 11/22/2014    HGBA1C 5.3 11/22/2014       _____________________    Additional Information:    Reviewed preoperative testing which has been scanned into EPIC.  Last anesthetic record reviewed  _____________________    Summary:    Age: 32  Weight: 59 kg  Allergies: cipro  ASA: 2  Plan: GA LMA    =====================          Signed by: Hulan Saas 12/04/16 10:45 AM    No changes in history.  Physical exam complete  NPO >= 8 hours     Hulan Saas MD  12/04/2016  12:11 PM

## 2016-12-04 NOTE — Op Note (Signed)
Full Operative Report    Date of Operation: 1:36 PM, 12/04/2016     Patient: [name] [name] - 61 y.o. 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  Soft tissue mass removal, right foot      Anesthesia:  General   No responsible provider has been recorded for the case.   Anesthesiologist: Hulan Saas, MD  CRNA: Asa Lente, CRNA; Alta Corning, CRNA     Estimated Blood Loss: 5 mL     Hemostasis:  1) Anatomical dissection, mechanical compression, electrocautery  2) A well padded ankle tourniquet inflated to 250 mmHg    Implants:  Implants     No active implants to display in this view.           Materials: Vicryl and Monocryl    Injectables:  1) Pre-operatively: 8 mL one-to-one mix 1 percent lidocaine plain and half percent bupivacaine plain  2) Post-operatively: 1 mL dexamethasone as well as 10 mL half percent bupivacaine plain    Specimens:      SPECIMENS (last 24 hours)      Pathology Specimens     Row Name 12/04/16 1300                Additional Information    Send final report to: Dr. Kathlynn Grate          Specimen Information    Specimen Testing Required Routine Pathology       Specimen ID  [id number]       Specimen Description RIGHT FOOT MASS            Antibiotics: 1 gram of Ancef was given preoperatively    Drains: None    Complications: Patient tolerated the procedure well without complication.     Findings: Soft tissue mass, right plantar lateral heel    Indications for Procedure:  [name] [name] presents to [facility], Posey Boyer, DPM with a chief complaint of soft tissue mass to right plantar lateral heel.  Patient has had pain with shoe gear and increased pain with pressure.  The patient has failed conservative treatments of various modalities. At this time the patient has elected to proceed with surgical correction. All alternatives, risks, and  complications of the procedures were thoroughly explained to the patient. Patient exhibits appropriate understanding of all discussion points and informed consent was signed and obtained in the chart with no guarantees to surgical outcome given or implied.    Description of Procedure:  Patient was brought to the operating room and placed on the operative table in the supine position. Patient was secured to the table with safety belt, a contralateral SCD was placed, and all bony prominences were well padded. A surgical timeout was performed and all members of the operating room, the procedure, and the surgical site were identified. IV sedation anesthesia occurred. Local anesthetic as previously described was then injected about the operative field in a local infiltrative block.     A well padded pneumatic tourniquet was placed to the right ankle. The right lower extremity was then prepped and draped in the usual sterile manner. The pneumatic tourniquet was inflated. The following procedure then began.    Attention was directed the patient's right lateral plantar foot near the heel.  Here there was a palpable and visualized soft tissue mass approximately  1 cm in diameter.  A linear incision was made directly over the body of this mass with a 15 blade.  Dissection ensued through subcutaneous tissues and around soft tissue mass using blunt and dull dissection.  The mass was well encapsulated.  This was freed from surrounding tissues using a tenotomy scissor.  The mass was freed from the underlying tissue and passed off to the back table.  This was then labeled and sent for pathology.  The portion of the mass was noted to have an appearance consistent with tophaceous material.  The surgical site was then irrigated with copious amounts sterile normal saline.  All remaining tophaceous material was removed from the surgical site.  The wound was further explored for any remaining abnormal tissues, none of which were found.   Deep layers were closed with Vicryl suture.  Skin was closed with 4-0 Monocryl in a interrupted horizontal mattress fashion.    The surgical site was then dressed with adaptic, 4x4 gauze, kerlix and ACE wrap. The tourniquet was deflated after 22 minutes with immediate vascular return noted to the operative foot. The patient tolerated both the procedure and anesthesia well with vital signs stable throughout. The patient was transferred from the OR to recovery under the discretion of anesthesia.    Condition: Patient was taken to PACU in good condition and all vital signs stable and neurovascular status intact to the operative limb.    Discharge:  The patient will be discharged with all postoperative instructions and prescriptions when cleared by anesthesia and will follow up in the office of the surgeon.    [place], Posey Boyer, DPM will follow the patient throughout the entire post-operative course and the patient is aware of all post-operative protocols in place. The patient will follow the protocol of rest, ice, and elevation. The patient will be toe-touch weightbearing in a soft dressing to the operative limb until further instructed. The dressing is to remain clean, dry, and intact.     Jeralyn Ruths, DPM    Foot and Ankle Surgery  [facility], PGY-3

## 2016-12-04 NOTE — Discharge Instr - AVS First Page (Addendum)
Instructions for after your discharge:   Surgeon's  Instructions     This information is to help you in your recovery after foot/ankle surgery. Please read this information carefully. Feel free to ask your doctor or nurse any questions about your recovery at home. You will receive further instructions at your postop appointment. Please take your xrays/ MRI's/ CT scans and other xray studies home with you.    1.      REST, ICE AND ELEVATION  Remain generally at rest. Apply ice over the dressing and elevate your foot "Toes above the nose" for the first 48 hours to help with swelling. If using a [facility], use for 30 minutes on and then 30 minutes off during waking hours.  Dressings must be kept dry.                 2.     Weight bearing  As discussed, remain toe-touch weightbearing Use crutches, walker, or knee scooter only if and when instructed by your doctor.   You should always wear the soft dressing.     3.     MEDICATION  Take pain medication as prescribed.    To minimize gastric upset from narcotics or anti-inflammatory medicine, eat adequate amounts of food. Increase fluid and fiber intake to ease constipation from pain medicine.    You should be taking a stool softener (ex. Colace) as long as you are on any narcotic pain medicines.  You should be having a bowel movement every 2-3 days. If not, then proceed with over the counter laxatives (Miralax), enemas, or suppositories to fix the constipation    You may take tylenol per package directions provided your pain medication does not have any tylenol in it (Percocet, Vicodin, and Norco all have tylenol. Do not take more than 3000 mg in a 24 hr period.      4.     DRESSING/SHOWERING  Leave dressing intact until seen in office postop. Slight bleeding through the dressing/cast is common. Reinforce the dressing/cast with 4x4 gauze pads and gauze wrap as needed.     No baths, no hot tubs, no swimming.  Cover your dressing/cast with plastic when  showering. Keep dressing/operative area clean and dry.  Use rubbing alcohol to remove the betadine prep solution from the skin.      5.     QUESTIONS/CONCERNS  Contact your doctor or surgeon for any of the following symptoms:    Fever greater than 101 degrees F for more than 2 days.    Numbness, loss of good color or coolness in the foot.    Severe pain unresponsive to narcotic medication.  Excessive bleeding or vomiting.  Difficulty breathing or shortness of breath, CALL 911.     6.  FOLLOW-UP  Your post-operative follow up appointment should be in 5-7 days         No driving until cleared by your physician                           Discharge Instructions: After Your Surgery  You've just had surgery. During surgery, you were given medicine called anesthesia to keep you relaxed and free of pain. After surgery, you may have some pain or nausea. This is common. Here are some tips for feeling better and getting well after surgery.     Stay on schedule with your medicine.   Going home  Your healthcare provider will  show you how to take care of yourself when you go home. He or she will also answer your questions. Have an adult family member or friend drive you home. For the first 24 hours after your surgery:   Do not drive or use heavy equipment.   Do not make important decisions or sign legal papers.   Do not drink alcohol.   Have someone stay with you, if needed. He or she can watch for problems and help keep you safe.  Be sure to go to all follow-up visits with your healthcare provider. And rest after your surgery for as long as your healthcare provider tells you to.  Coping with pain  If you have pain after surgery, pain medicine will help you feel better. Take it as told, before pain becomes severe. Also, ask your healthcare provider or pharmacist about other ways to control pain. This might be with heat, ice, or relaxation. And follow any other instructions your surgeon or nurse gives you.  Tips for taking pain  medicine  To get the best relief possible, remember these points:   Pain medicines can upset your stomach. Taking them with a little food may help.   Most pain relievers taken by mouth need at least 20 to 30 minutes to start to work.   Taking medicine on a schedule can help you remember to take it. Try to time your medicine so that you can take it before starting an activity. This might be before you get dressed, go for a walk, or sit down for dinner.   Constipation is a common side effect of pain medicines. Call your healthcare provider before taking any medicines such as laxatives or stool softeners to help ease constipation. Also ask if you should skip any foods. Drinkinglots of fluids andeating foodssuch as fruits and vegetables that are high in fiber can also help. Remember, do not take laxatives unless your surgeon has prescribed them.   Drinking alcohol and taking pain medicine can cause dizziness and slow your breathing. It can even be deadly. Do not drink alcohol while taking pain medicine.   Pain medicine can make you react more slowly to things. Do not drive or run machinery while taking pain medicine.  Your healthcare providermay tell you to take acetaminophen to help ease your pain. Ask him or her how much you are supposed to take each day. Acetaminophen or other pain relievers may interact with your prescription medicines or other over-the-counter (OTC) medicines. Some prescription medicines have acetaminophen and other ingredients.Using both prescription and OTC acetaminophenfor paincan cause you to overdose. Readthe labels on your OTC medicineswith care. This will help youto clearly know the list of ingredients, how much to take, and anywarnings. It may also help you not take too muchacetaminophen.If you have questions or do not understand the information, ask your pharmacist or healthcare provider to explain it to you before you take the OTC medicine.  Managing nausea  Some people  have an upset stomach after surgery. This is often because of anesthesia, pain, or pain medicine, or the stress of surgery. These tips will help you handle nausea and eat healthy foods as you get better. If you were on a special food plan before surgery, ask your healthcare provider if you should follow it while you get better. These tips may help:   Do not push yourself to eat. Your body will tell you when to eat and how much.   Start off with clear liquids and soup.  They are easier to digest.   Next try semi-solid foods, such as mashed potatoes, applesauce, and gelatin, as you feel ready.   Slowly move to solid foods. Don't eat fatty, rich, or spicy foods at first.   Do not force yourself to have 3 large meals a day. Instead eat smaller amounts more often.   Take pain medicines with a small amount of solid food, such as crackers or toast, to avoid nausea.    Call your surgeon if.   You still have pain an hour after taking medicine. The medicine may not be strong enough.   You feel too sleepy, dizzy, or groggy. The medicine may be too strong.   You have side effects like nausea, vomiting, or skin changes, such as rash, itching, or hives.     If you have obstructive sleep apnea  You were given anesthesia medicine during surgery to keep you comfortable and free of pain. After surgery, you may have more apnea spells because of this medicine and other medicines you were given. The spells may last longer than usual.  At home:   Keep using the continuous positive airway pressure (CPAP) device when you sleep. Unless your healthcare provider tells you not to, use it when you sleep, day or night. CPAP is a common device used to treat obstructive sleep apnea.   Talk with your provider before taking any pain medicine, muscle relaxants, or sedatives. Your provider will tell you about the possible dangers of taking these medicines.  Date Last Reviewed: 05/10/2015   2000-2017 [employer], [place]. [address] [place], [place] [address]. All rights reserved. This information is not intended as a substitute for professional medical care. Always follow your healthcare professional's instructions.

## 2016-12-04 NOTE — PACU (Signed)
Surgical shoe placed on patient since toes exposed and dressing is too bulky to place a sock over.  Patient aware WB status is toe touch only and demonstrates it when going to bathroom x 2 in wheelchair.

## 2016-12-04 NOTE — Transfer of Care (Signed)
Anesthesia Transfer of Care Note    Patient: Danyela Posas    Procedures performed: Procedure(s) with comments:  EXCISION, FOOT LESION - RIGHT FOOT SOFT TISSUE MASS RESECTION    Anesthesia type: General TIVA    Patient location:Phase I PACU    Last vitals:   Vitals:    12/04/16 1330   BP: 109/57   Pulse: 78   Resp: 14   Temp: 36.4 C (97.5 F)   SpO2: 100%       Post pain: Patient not complaining of pain, continue current therapy      Mental Status:awake and alert     Respiratory Function: tolerating face mask    Cardiovascular: stable    Nausea/Vomiting: patient not complaining of nausea or vomiting    Hydration Status: adequate    Post assessment: no apparent anesthetic complications, no reportable events and no evidence of recall    Signed by: Alta Corning  12/04/16 1:30 PM

## 2016-12-04 NOTE — PACU (Signed)
Called Dr. Effie Shy to notify him that patient was in sinus arrythmia with occasional PVCs upon arrival to pacu.  Patient in NSR currently with no PVCs.  No cardiac symptoms.  OK to transfer to phase II of care.

## 2016-12-04 NOTE — H&P (Signed)
FOOT & ANKLE SURGERY   PRE-OP HISTORY & PHYSICAL      12/04/2016 12:19 PM    HPI: [name] [name] is a 61 y.o. year old female.  She presents with painful right heel soft tissue mass. Pain is worse with shoes and pressure. All alternatives, risks and complications discussed with patient and all questions and concerns addressed.  All questions and concerns addressed and patient exhibited appropriate understanding of all discussion points. Informed consent obtained with no guarantees to surgical outcome given or implied.    Assessment: [name] [name] is a 61 y.o. female with painful right heel soft tissue mass.    Plan:   Proceed with planned surgery to the right foot:  Procedure(s) (LRB):  EXCISION, FOOT LESION (Right)       Jeralyn Ruths, DPM  [facility] PGY-3  [phone]      Past Medical History:   Past Medical History:   Diagnosis Date   . Claustrophobia    . Hyperlipidemia    . Lipoma     base of posterior neck   . Low back pain    . Malignant neoplasm of skin     Melanoma on left shoulder back   . Plantar fasciitis, right    . Rash    . Seasonal allergies        Past Surgical History:   Past Surgical History:   Procedure Laterality Date   . BIOPSY, SENTINEL NODE N/A 05/19/2014    Procedure: BIOPSY, SENTINEL NODE;  Surgeon: Lorenza Chick, MD;  Location: [facility] OR;  Service: General;  Laterality: N/A;   SENTINEL LYMPH NODE BIOSPY W/ NUC MED @ 8:00AM,    . EXCISION, LIPOMA N/A 11/24/2014    Procedure: EXCISION, LIPOMA OF BASE OF NECK;  Surgeon: Lorenza Chick, MD;  Location: [facility];  Service: General;  Laterality: N/A;  BACK LIPOMA EXCISION   . EXCISION, MASS Left 04/18/2015    Procedure: EXCISION, MASS;  Surgeon: Lorenza Chick, MD;  Location: [facility] OR;  Service: General;  Laterality: Left;  LEFT THIGH MASS EXCISION   . EXCISION, MELANOMA Bilateral 05/19/2014    Procedure: EXCISION, MELANOMA;  Surgeon: Lorenza Chick, MD;  [facility];  Service: General;  Laterality: Bilateral;   LEFT SHOULDER MELANOMA WIDE LOCAL EXCISION, RIGHT MID-BACK ATYPICAL NEVUS EXCISION     . EYE SURGERY     . LASIK  2003   . SKIN BIOPSY         Medications:   Home Meds:   Prior to Admission medications    Medication Sig Start Date End Date Taking? Authorizing Provider   aspirin EC 81 MG EC tablet Take 81 mg by mouth daily.   Yes [provider]   atorvastatin (LIPITOR) 10 MG tablet Take one tablet by mouth one time daily 05/15/15  Yes Bauer, Christina A, DO   Levothyroxine Sodium 25 MCG Cap Take by mouth daily.   Yes [provider]   loratadine (CLARITIN) 10 MG tablet Take 10 mg by mouth as needed.      Yes [provider]   Ascorbic Acid (VITAMIN C) 500 MG tablet Take 500 mg by mouth daily.    [provider]   Cholecalciferol (VITAMIN D-3) 5000 UNITS TABS Take by mouth daily.        [provider]   HYDROcodone-acetaminophen (NORCO) 5-325 MG per tablet TK 1 T PO Q 6 H PRN 04/06/16   [provider]   Omega-3  Fatty Acids (FISH OIL) 1200 MG CAPS Take 1,200 mg by mouth 2 (two) times daily.    [provider]   vitamin B-12 (CYANOCOBALAMIN) 1000 MCG tablet Take 1,000 mcg by mouth once a week.    [provider]       Scheduled Meds: PRN Meds:      ceFAZolin 1 g Intravenous 30 Min Pre-Op       Continuous Infusions:  . lactated ringers               Allergies:   Allergies   Allergen Reactions   . Ciprofloxacin Swelling       Family History:   Family History   Problem Relation Age of Onset   . Hypertension Father    . Hyperlipidemia Father    . Aneurysm Father    . Heart disease Father      unsure of age   . Asthma Brother    . Hyperlipidemia Brother    . ADD / ADHD Daughter    . Asthma Daughter    . Alcohol abuse Maternal Uncle    . Alcohol abuse Maternal Grandfather    . Breast cancer Maternal Grandmother    . Cancer Maternal Grandmother      breast   . Thyroid disease Sister        Social History:   Social History     Social History   . Marital  status: Single     Spouse name: N/A   . Number of children: N/A   . Years of education: N/A     Occupational History   . Not on file.     Social History Main Topics   . Smoking status: Former Smoker     Packs/day: 1.00     Years: 30.00     Quit date: 10/29/2002   . Smokeless tobacco: Never Used   . Alcohol use 0.0 oz/week      Comment: drinks wine nightly (2 glasses); very rare other alcohol   . Drug use: No   . Sexual activity: Yes     Partners: Male     Birth control/ protection: None     Other Topics Concern   . Not on file     Social History Narrative   . No narrative on file       Review of Systems:  Negative for chest pain and shortness of breath    Physical exam:       Patient is a 61 y.o. year old female who is alert, well appearing, and in no distress.  Vitals:    12/04/16 1135   BP: 138/71   Pulse: 90   Resp: 16   Temp: 97.9 F (36.6 C)   SpO2: 96%       59 kg (130 lb)   1.575 m (5\' 2" )  Estimated body mass index is 23.78 kg/m as calculated from the following:    Height as of this encounter: 1.575 m (5\' 2" ).    Weight as of this encounter: 59 kg (130 lb).    General: Alert and in no acute distress  HEENT: PEERLA  Heart: Regular rate, normal rhythm  Lungs: Symmetrical chest rise, non-labored breathing  Abdomen: Deferred  Neuro: Gross sensation to all extremities intact    LE Exam:    Derm: Skin normal texture and temperature.    STM right lateral heel     Neuro: Gross sensation intact     Vasc: Palpable  DP & PT pulses    Cap refill to digits is less than 3 seconds.     MSK:  No gross deformity present.         Labs:   Hematology (most recent):  Lab Results   Component Value Date/Time    WBC 6.50 11/22/2014 11:10 AM    WBC 4.1 09/12/2005 12:00 PM    HCT 40.1 11/22/2014 11:10 AM    HGB 12.8 11/22/2014 11:10 AM    HGB 12.6 11/19/2012 09:18 AM   Chemistry (most recent):  Lab Results   Component Value Date/Time    NA 142 11/22/2014 11:10 AM    K 4.2 11/22/2014 11:10 AM    CL 104 11/22/2014 11:10 AM    CL 106  11/19/2012 09:18 AM    CO2 27 11/22/2014 11:10 AM    BUN 9.0 11/22/2014 11:10 AM    CREAT 0.7 11/22/2014 11:10 AM    CREAT 0.72 11/19/2012 09:18 AM    GLU 107 (H) 11/22/2014 11:10 AM    CA 9.4 11/22/2014 11:10 AM   Coagulation Profile (most recent):  Lab Results   Component Value Date/Time    PT 13.6 12/16/2013 01:20 PM    PTT 24 12/16/2013 01:20 PM    INR 1.1 12/16/2013 01:20 PM   Hepatic Profile (most recent):  Lab Results   Component Value Date/Time    AST 28 11/22/2014 11:10 AM    ALT 30 11/22/2014 11:10 AM   Nutrition Profile (last two  values):  Lab Results   Component Value Date/Time    ALB 3.6 11/22/2014 11:10 AM    ALB 3.6 03/09/2014 11:15 AM     Anesthesia and nursing records reviewed.

## 2016-12-04 NOTE — Anesthesia Postprocedure Evaluation (Signed)
Anesthesia Post Evaluation    Patient: [name] [name]    Procedure(s) with comments:  EXCISION, FOOT LESION - RIGHT FOOT SOFT TISSUE MASS RESECTION    Anesthesia type: General TIVA    Last Vitals:   Vitals:    12/04/16 1410   BP: 135/62   Pulse: 66   Resp: 14   Temp:    SpO2: 96%       Patient Location: Phase II PACU      Post Pain: Patient not complaining of pain, continue current therapy    Mental Status: awake    Respiratory Function: tolerating room air    Cardiovascular: stable    Nausea/Vomiting: patient not complaining of nausea or vomiting    Hydration Status: adequate    Post Assessment: no apparent anesthetic complications and no reportable events          Anesthesia Qualified Clinical Data Registry 2018    PACU Reintubation  Did the Patient have general anesthesia with intubation: No        PONV Adult  Is the patient aged 44 or older: Yes  Did the patient receive recieve a general anesthestic: Yes  Does the patient have 3 or more risk factors for PONV? Yes  Did the patient receive anti-emetics from at least two classes of medications? Yes      PONV Pediatric  Is the patient aged 67-17? No            PACU Transfer Checklist Protocol  Was the patient transferred to the PACU at the conclusion of surgery? Yes  Was a checklist or transfer protocol used? Yes    ICU Transfer Checklist Protocol  Was the patient transferred to the ICU at the conclusion of surgery? No      Post-op Pain Assessment Prior to Anesthesia Care End  Age >=18 and assessed for pain in PACU: Yes  Pacu pain score <7/10: Yes      Perioperative Mortality  Perioperative mortality prior to Anesthesia end time: No    Perioperative Cardiac Arrest  Did the patient have an unanticipated intraoperative cardiac arrest between anesthesia start time and anesthesia end time? No    Unplanned Admission to ICU  Did the patient have an unplanned admission to the ICU (not initially anticipated at anesthesia start time)? No      Signed by: Hulan Saas,  12/04/2016 2:28 PM

## 2016-12-05 ENCOUNTER — Encounter: Payer: Self-pay | Admitting: Foot & Ankle Surgery

## 2016-12-15 LAB — LAB USE ONLY - HISTORICAL SURGICAL PATHOLOGY

## 2016-12-22 ENCOUNTER — Encounter (INDEPENDENT_AMBULATORY_CARE_PROVIDER_SITE_OTHER): Payer: Self-pay | Admitting: Medical

## 2017-01-02 ENCOUNTER — Encounter (INDEPENDENT_AMBULATORY_CARE_PROVIDER_SITE_OTHER): Payer: Self-pay | Admitting: Family Medicine

## 2017-02-05 ENCOUNTER — Other Ambulatory Visit: Payer: Self-pay | Admitting: Medical

## 2017-02-05 DIAGNOSIS — Z0000 Encounter for general adult medical examination without abnormal findings: Secondary | ICD-10-CM

## 2017-02-13 ENCOUNTER — Other Ambulatory Visit: Payer: Self-pay | Admitting: Medical

## 2017-02-13 ENCOUNTER — Ambulatory Visit
Admission: RE | Admit: 2017-02-13 | Discharge: 2017-02-13 | Disposition: A | Discharge status: Home / Self Care | Payer: BC Managed Care – PPO | Source: Ambulatory Visit | Attending: Medical | Admitting: Medical

## 2017-02-13 DIAGNOSIS — Z0000 Encounter for general adult medical examination without abnormal findings: Secondary | ICD-10-CM

## 2017-04-08 ENCOUNTER — Ambulatory Visit: Payer: BC Managed Care – PPO | Admitting: Hematology & Oncology

## 2017-04-08 ENCOUNTER — Ambulatory Visit: Payer: BC Managed Care – PPO | Admitting: Dermatology

## 2017-04-09 ENCOUNTER — Ambulatory Visit: Payer: BC Managed Care – PPO | Admitting: Hematology & Oncology

## 2017-06-23 ENCOUNTER — Other Ambulatory Visit: Payer: Self-pay

## 2017-06-23 DIAGNOSIS — C4362 Malignant melanoma of left upper limb, including shoulder: Secondary | ICD-10-CM

## 2017-06-24 ENCOUNTER — Ambulatory Visit (HOSPITAL_BASED_OUTPATIENT_CLINIC_OR_DEPARTMENT_OTHER): Payer: BC Managed Care – PPO | Admitting: Hematology & Oncology

## 2017-06-24 ENCOUNTER — Encounter: Payer: Self-pay | Admitting: Dermatology

## 2017-06-24 ENCOUNTER — Ambulatory Visit: Payer: BC Managed Care – PPO | Attending: Dermatology | Admitting: Dermatology

## 2017-06-24 VITALS — BP 137/71 | HR 86 | Temp 98.8°F | Wt 127.0 lb

## 2017-06-24 VITALS — BP 137/71 | HR 86 | Temp 98.6°F | Wt 127.0 lb

## 2017-06-24 DIAGNOSIS — Z85820 Personal history of malignant melanoma of skin: Secondary | ICD-10-CM | POA: Insufficient documentation

## 2017-06-24 DIAGNOSIS — C4362 Malignant melanoma of left upper limb, including shoulder: Secondary | ICD-10-CM

## 2017-06-24 DIAGNOSIS — Z1283 Encounter for screening for malignant neoplasm of skin: Secondary | ICD-10-CM

## 2017-06-24 DIAGNOSIS — D229 Melanocytic nevi, unspecified: Secondary | ICD-10-CM | POA: Insufficient documentation

## 2017-06-24 NOTE — Progress Notes (Signed)
[facility]    CC: Melanoma follow-up, Total body skin examination (TBSE)    HISTORY OF PRESENT ILLNESS  Interval:  Presents today for TBSE on surveillance for melanoma left posterior shoulder. Last TBSE was with Jeannette Corpus, FNP  in March 2018, at which time no suspicious lesions were identified warranting biopsy. Today she denies any new, changing, itching, peeling, bleeding, painful lesions. Denies enlarged or tender lymph nodes. States some congestion and cough at night since Saturday that she believes is likely a viral infection.     Since last visit she was diagnosed with gout, discovered during surgery for removal of what was thought to be a vascular lesion of the right heel.    Detailed:  [name] [name] is 62 y.o. female with a history of Stage 1B Left posterior shoulder. Patient noted a growing mole which became like a "pinkish/purpleish blister"  over the past 5 months, + bleeding. Saw her dermatologist Dr. Christella Noa who recommended excisional biopsy which was performed by Dr. Webb Silversmith on 04/06/14. Pathology showed 1.47 mm thick, non-ulcerated melanoma with 2 mitoses/mm2. Peripheral margin was close with melanoma in situ. She underwent wide local excision and sentinel lymph node biopsy on 05/19/14 which showed no residual melanoma at primary site and two negative left supraclavicular lymph nodes. Pre op CXR and LDH were normal.    No personal/family history of skin cancer. No tanning bed use. No blistering sunburns.    Of note, lipoma (confirmed on biopsy and FISH November 2016) on left lateral thigh was removed.   Current Outpatient Prescriptions   Medication Sig Dispense Refill   . aspirin EC 81 MG EC tablet Take 81 mg by mouth daily.     Marland Kitchen atorvastatin (LIPITOR) 10 MG tablet Take one tablet by mouth one time daily 30 tablet 0   . loratadine (CLARITIN) 10 MG tablet Take 10 mg by mouth as needed.        . Omega-3 Fatty Acids (FISH OIL) 1200 MG CAPS Take by mouth 2 (two)  times daily.Essential Oil Omega Complex- 2tab qd         . UNABLE TO FIND Copaiba (Colpaifera Essential Oil- 1tab qd     . Fexofenadine HCl (MUCINEX ALLERGY PO) Take by mouth.     Marland Kitchen UNABLE TO FIND Micro Plex (Food Nutrient Complex) Twice daily.     Marland Kitchen UNABLE TO FIND Polyphenol Complex (deep blue) twice daily per pt.       No current facility-administered medications for this visit.      Allergies   Allergen Reactions   . Ciprofloxacin Swelling     Past Medical History:   Diagnosis Date   . Claustrophobia    . Hyperlipidemia    . Lipoma     base of posterior neck   . Low back pain    . Malignant neoplasm of skin     Melanoma on left shoulder back   . Plantar fasciitis, right    . Rash    . Seasonal allergies      Past Surgical History:   Procedure Laterality Date   . BIOPSY, SENTINEL NODE N/A 05/19/2014    Procedure: BIOPSY, SENTINEL NODE;  Surgeon: Lorenza Chick, MD;  Location: [facility] OR;  Service: General;  Laterality: N/A;   SENTINEL LYMPH NODE BIOSPY W/ NUC MED @ 8:00AM,    . EXCISION, FOOT LESION Right 12/04/2016    Procedure: EXCISION, FOOT LESION;  Surgeon: Cathie Hoops, DPM;  Location: [facility];  Service: Podiatry;  Laterality: Right;  RIGHT FOOT SOFT TISSUE MASS RESECTION   . EXCISION, LIPOMA N/A 11/24/2014    Procedure: EXCISION, LIPOMA OF BASE OF NECK;  Surgeon: Lorenza Chick, MD;  Location: [facility];  Service: General;  Laterality: N/A;  BACK LIPOMA EXCISION   . EXCISION, MASS Left 04/18/2015    Procedure: EXCISION, MASS;  Surgeon: Lorenza Chick, MD;  Location: [facility] OR;  Service: General;  Laterality: Left;  LEFT THIGH MASS EXCISION   . EXCISION, MELANOMA Bilateral 05/19/2014    Procedure: EXCISION, MELANOMA;  Surgeon: Lorenza Chick, MD;  Location: [facility] OR;  Service: General;  Laterality: Bilateral;  LEFT SHOULDER MELANOMA WIDE LOCAL EXCISION, RIGHT MID-BACK ATYPICAL NEVUS EXCISION     . EYE SURGERY     . LASIK  2003   . SKIN BIOPSY       Family History    Problem Relation Age of Onset   . Hypertension Father    . Hyperlipidemia Father    . Aneurysm Father    . Heart disease Father         unsure of age   . Asthma Brother    . Hyperlipidemia Brother    . ADD / ADHD Daughter    . Asthma Daughter    . Alcohol abuse Maternal Uncle    . Alcohol abuse Maternal Grandfather    . Breast cancer Maternal Grandmother    . Cancer Maternal Grandmother         breast   . Thyroid disease Sister      Social History     Social History   . Marital status: Single     Spouse name: N/A   . Number of children: N/A   . Years of education: N/A     Occupational History   . Not on file.     Social History Main Topics   . Smoking status: Former Smoker     Packs/day: 1.00     Years: 30.00     Quit date: 10/29/2002   . Smokeless tobacco: Never Used   . Alcohol use 0.0 oz/week      Comment: drinks wine nightly (2 glasses); very rare other alcohol   . Drug use: No   . Sexual activity: Yes     Partners: Male     Birth control/ protection: None     Other Topics Concern   . Not on file     Social History Narrative   . No narrative on file       REVIEW OF SYSTEMS  Const - no fever no chills   CV - no chest pain   Resp- + cough and congestion  Neuro - no headache   Abd - no pain   Integ - see HPI    All other systems reviewed and negative      Objective:     Vitals:    06/24/17 1427   BP: 137/71   Pulse: 86   Temp: 98.6 F (37 C)      Constitutional: General appearance: NAD, conversant    HEENT: normocephalic, atraumatic. Conjunctiva, lids, lips, examined with no suspicious lesions    SKIN:A total body skin examination is completed including:   - palpation of scalp and inspection of hair of scalp, eyebrows, face, chest and extremities   - inspection of eccrine and apocrine glands.   - inspection and/or palpation of the skin and subcutaneous tissues with the following  anatomic skin sites examined with the following findings:   1. Head including face - no lesions of concern  2. Neck  - no lesions of  concern  3. Chest including breasts, and axillae - no lesions of concern  4. Abdomen -   a. Uniformly dark brown 4 x 4 mm minimally elevated on left abdomen superior and lateral to umbilicus (stable)  5. Genitalia, groin, buttocks - no lesions of concern  6. Back -   a. Posterior base of neck-healed scar (excised lipoma site)  b. Midline upper back- 5 x 5 mm light brown macule with darker brown area of pigment superiorly (measures 1 x 2 mm), few hairs coming out and breaking up pigment (imaged dermoscopically today, stable compared to photos in [place] 11/2015, monitor)  7. Right upper extremity -   a. Right axilla- 3 x 2 mm brown macule (stable, continue to monitor)  8. Left upper extremity -   a. posterior shoulder 6 cm well healed scar (MM site 10/15) no pigmentation or nodulatrity  9. Right lower extremity -  10. Left lower extremity -   a. Left outer thigh - transverse scar 10cm (lipoma excision)- posterior aspect is a 4mm heart shaped dark brown macule (stable)     - Dermatoscopy was used to evaluate the nevi.   - General skin: multiple brown macules, size 2-26mm, approximately 50 in number, distributed primarily on the trunk. Extensive photodamage and rhytids    LYMPHATIC:Examination of lymph node basins is completed, including the H and N, axilla and groin. There is no lymphadenopathy.    NECK: supple, no masses    CVS: no swelling, varicosities or edema    EXTREMITIES: inspection and palpation of digits and nails revealed no abnormalities    NEURO/PSYCH: alert and oriented, pleasant      Pathology reports   Date 04/06/14  Accession Number [id number])  Location Lt Post Shoulder  Dx MM   Clark Level IV   Thickness 1.38mm   Mitotic Rate 2 per mm   Ulceration Absent   Regression Absent       Assessment & Plan:   62 y.o. female with a history of Stage IB Left posterior shoulder 04/06/14 s/p WLE and negative SLNB. Presents for Total body skin examination (TBSE).    1. Melanoma: Stage IB Left posterior shoulder  03/2014  -No evidence of recurrence on exam  -Continue TBSE 6 months x 3 years until October 2020, then annually.   -Patient has all of her skin checks here. She will alternate between Jeannette Corpus, FNP (due July 2019) and Dr. Ruel Favors (January 2020).  -Also see by Dr. Crosby Oyster today. Annual CXR per Dr. Crosby Oyster for surveillance.    2. TBSE   - total body skin examination performed today   - no lesions concerning to warrant biopsy today   - Several have been documented as above and will be followed. Lesion on midline upper back imaged dermoscopically today so that is can be followed.    - Sun protection reviewed   - ABCDEs of melanoma reviewed   - Monthly self-skin examination was advised and the patient was instructed to return to clinic prior to scheduled visit if any suspicious or worrisome findings are discovered    RTC in 6 months for TBSE with Jeannette Corpus, FNP  RTC in 12 months for TBSE with Dr. Ruel Favors (dual visit with Dr. Crosby Oyster)    Jeannette Corpus, DNP, FNP-BC  Nurse Practitioner  [facility]  [address]  [place], [place] [address]  [phone]  [phone]      Suraj S. Ruel Favors, MD, FAAD  Medical Director, [facility]  [facility]  [phone] (t)   [phone] (f)     I personally reviewed the patients history, pathology when relevant, and completed an independent skin examination. I agree with all components of this written note. I have made addendums where/if needed. S. VENNA.

## 2017-06-24 NOTE — Progress Notes (Signed)
[facility]    CC: Melanoma follow-up    HISTORY OF PRESENT ILLNESS  [name] [name] is 62 y.o. female who noted a growing mole in left shoulder which became like "blister" over 5 months. Also noted bleeding from the lesion. Saw her dermatologist Dr. Christella Noa who recommended excisional biopsy which was performed by Dr. Webb Silversmith on 04/06/14. Pathology showed 1.47 mm thick, non-ulcerated melanoma with 2 mitoses/mm2. Peripheral margin was close with melanoma in situ. Preop CXR and LDH were normal. She underwent wide local excision and sentinel lymph node biopsy on 05/19/14 which showed no residual melanoma at primary site and two negative left supraclavicular lymph nodes. She noticed new soft lump in left hip area in August 2016 for which ultrasound showed features most consistent with lipoma. She underwent excision by Dr.Hafner on 04/18/15 which confirmed it to be lipoma.    Last LDH was elevated (265) in March 2017. Repeat LDH in July 2017 was within normal limits (223). CXR 08/22/15 showed no evidence of metastatic disease.     CXR November 2017 showed no evidence of metastatic disease.    She presents for follow-up on surveillance for melanoma of left posterior shoulder. States some congestion and cough at night since Saturday that she believes is likely a viral infection. Denies enlarged or tender lymph nodes.Patient denies any rash/itching, fatigue, weight loss, headache, shortness of breath, nausea, vomiting, diarrhea or abdominal pain.     Current Outpatient Prescriptions   Medication Sig Dispense Refill   . aspirin EC 81 MG EC tablet Take 81 mg by mouth daily.     Marland Kitchen atorvastatin (LIPITOR) 10 MG tablet Take one tablet by mouth one time daily 30 tablet 0   . Fexofenadine HCl (MUCINEX ALLERGY PO) Take by mouth.     . loratadine (CLARITIN) 10 MG tablet Take 10 mg by mouth as needed.        . Omega-3 Fatty Acids (FISH OIL) 1200 MG CAPS Take by mouth 2 (two) times daily.Essential Oil  Omega Complex- 2tab qd         . UNABLE TO FIND Copaiba (Colpaifera Essential Oil- 1tab qd     . UNABLE TO FIND Micro Plex (Food Nutrient Complex) Twice daily.     Marland Kitchen UNABLE TO FIND Polyphenol Complex (deep blue) twice daily per pt.       No current facility-administered medications for this visit.      Allergies   Allergen Reactions   . Ciprofloxacin Swelling     Past Medical History:   Diagnosis Date   . Claustrophobia    . Hyperlipidemia    . Lipoma     base of posterior neck   . Low back pain    . Malignant neoplasm of skin     Melanoma on left shoulder back   . Plantar fasciitis, right    . Rash    . Seasonal allergies      Past Surgical History:   Procedure Laterality Date   . BIOPSY, SENTINEL NODE N/A 05/19/2014    Procedure: BIOPSY, SENTINEL NODE;  Surgeon: Lorenza Chick, MD;  Location: [facility] OR;  Service: General;  Laterality: N/A;   SENTINEL LYMPH NODE BIOSPY W/ NUC MED @ 8:00AM,    . EXCISION, FOOT LESION Right 12/04/2016    Procedure: EXCISION, FOOT LESION;  Surgeon: Cathie Hoops, DPM;  Location: [facility] OR;  Service: Podiatry;  Laterality: Right;  RIGHT FOOT SOFT TISSUE MASS RESECTION   . EXCISION,  LIPOMA N/A 11/24/2014    Procedure: EXCISION, LIPOMA OF BASE OF NECK;  Surgeon: Lorenza Chick, MD;  Location: [facility];  Service: General;  Laterality: N/A;  BACK LIPOMA EXCISION   . EXCISION, MASS Left 04/18/2015    Procedure: EXCISION, MASS;  Surgeon: Lorenza Chick, MD;  Location: [facility];  Service: General;  Laterality: Left;  LEFT THIGH MASS EXCISION   . EXCISION, MELANOMA Bilateral 05/19/2014    Procedure: EXCISION, MELANOMA;  Surgeon: Lorenza Chick, MD;  Location: [facility] OR;  Service: General;  Laterality: Bilateral;  LEFT SHOULDER MELANOMA WIDE LOCAL EXCISION, RIGHT MID-BACK ATYPICAL NEVUS EXCISION     . EYE SURGERY     . LASIK  2003   . SKIN BIOPSY       Family History   Problem Relation Age of Onset   . Hypertension Father    . Hyperlipidemia Father    .  Aneurysm Father    . Heart disease Father         unsure of age   . Asthma Brother    . Hyperlipidemia Brother    . ADD / ADHD Daughter    . Asthma Daughter    . Alcohol abuse Maternal Uncle    . Alcohol abuse Maternal Grandfather    . Breast cancer Maternal Grandmother    . Cancer Maternal Grandmother         breast   . Thyroid disease Sister      Social History     Social History   . Marital status: Single     Spouse name: N/A   . Number of children: N/A   . Years of education: N/A     Occupational History   . Not on file.     Social History Main Topics   . Smoking status: Former Smoker     Packs/day: 1.00     Years: 30.00     Quit date: 10/29/2002   . Smokeless tobacco: Never Used   . Alcohol use 0.0 oz/week      Comment: drinks wine nightly (2 glasses); very rare other alcohol   . Drug use: No   . Sexual activity: Yes     Partners: Male     Birth control/ protection: None     Other Topics Concern   . Not on file     Social History Narrative   . No narrative on file       REVIEW OF SYSTEMS  All other systems were reviewed and are negative except as previously noted in the HPI.       Objective:     Vitals:    06/24/17 1443   BP: 137/71   Pulse: 86   Temp: 98.8 F (37.1 C)   SpO2: 95%       ECOG PS: 0  General: Vitals Reviewed. NAD  Eyes: Anicteric, normal conjunctiva  ENT: No sores in mouth  CV: RRR  RESP: CTA B  GI: NT/ND, no HSM  Neuro: AAO x 3  MS: no swelling in arms or legs  SKIN: 8 cm wide local excision scar in left shoulder without swelling, bleeding or tenderness. No evidence of satellite or in-transit lesion. 10cm excision scar in left lateral upper thigh  LYMPH: negative cervical suplarclavicular axillary inguinal.     Pathology reports   Date 04/06/14  Accession Number ([id number])  Location Lt Post Shoulder  Dx MM   Clark Level IV   Thickness 1.35mm  Mitotic Rate 2 per mm   Ulceration Absent   Regression Absent       Assessment & Plan:   1. Melanoma: Stage 1B Left posterior shoulder, s/p WLE and  negative SLNB December 2015   -No clinical evidence of recurrence.   -Repeat CXR today. If no evidence of metastatic disease, RTC in 1 year.   -Will plan for annual visit with CXR until December 2020.   -Cough, likely unrelated to melanoma, and likely related to post-nasal drip r/t viral upper respiratory infection.   -Skin exam by Dr. Ruel Favors today.    RTC in 1 year.    Tiney Rouge, MD   Medical Oncology  Director, Melanoma and Cutaneous Oncology Therapeutics and Research  [facility]  P [phone]

## 2017-07-13 ENCOUNTER — Telehealth: Payer: Self-pay

## 2017-07-13 ENCOUNTER — Ambulatory Visit
Admission: RE | Admit: 2017-07-13 | Discharge: 2017-07-13 | Disposition: A | Discharge status: Home / Self Care | Payer: BC Managed Care – PPO | Source: Ambulatory Visit | Attending: Hematology & Oncology | Admitting: Hematology & Oncology

## 2017-07-13 DIAGNOSIS — C4362 Malignant melanoma of left upper limb, including shoulder: Secondary | ICD-10-CM | POA: Insufficient documentation

## 2017-07-13 NOTE — Telephone Encounter (Addendum)
Left patient VM [id number] results. Patient informed of normal chest x-ray.  ----- Message from Tiney Rouge, MD sent at 07/13/2017  3:25 PM EST -----  Call patient. Normal CXR    ----- Message -----  From: Interface, Rad Results In  Sent: 07/13/2017   2:53 PM  To: Tiney Rouge, MD

## 2017-11-27 ENCOUNTER — Ambulatory Visit: Payer: Commercial Managed Care - POS | Attending: Family | Admitting: Family

## 2017-11-27 ENCOUNTER — Encounter: Payer: Self-pay | Admitting: Family

## 2017-11-27 VITALS — BP 127/83 | HR 84 | Temp 98.2°F | Wt 132.2 lb

## 2017-11-27 DIAGNOSIS — D229 Melanocytic nevi, unspecified: Secondary | ICD-10-CM

## 2017-11-27 DIAGNOSIS — L821 Other seborrheic keratosis: Secondary | ICD-10-CM

## 2017-11-27 DIAGNOSIS — L820 Inflamed seborrheic keratosis: Secondary | ICD-10-CM | POA: Insufficient documentation

## 2017-11-27 DIAGNOSIS — D485 Neoplasm of uncertain behavior of skin: Secondary | ICD-10-CM

## 2017-11-27 DIAGNOSIS — Z1283 Encounter for screening for malignant neoplasm of skin: Secondary | ICD-10-CM

## 2017-11-27 DIAGNOSIS — Z85820 Personal history of malignant melanoma of skin: Secondary | ICD-10-CM

## 2017-11-27 LAB — SURGICAL PATHOLOGY EXAM

## 2017-11-27 NOTE — Progress Notes (Signed)
[facility]    CC: Melanoma follow-up, Total body skin examination (TBSE)    HISTORY OF PRESENT ILLNESS  Interval:  Presents today for TBSE on surveillance for melanoma left posterior shoulder. Last TBSE was with Dr. Ruel Favors in January 2019, at which time no suspicious lesions were identified warranting biopsy. Today she denies any new, changing, itching, peeling, bleeding, painful lesions. Denies enlarged or tender lymph nodes.     Detailed:  [name] [name] is 62 y.o. female with a history of Stage 1B Left posterior shoulder. Patient noted a growing mole which became like a "pinkish/purpleish blister"  over the past 5 months, + bleeding. Saw her dermatologist Dr. Christella Noa who recommended excisional biopsy which was performed by Dr. Webb Silversmith on 04/06/14. Pathology showed 1.47 mm thick, non-ulcerated melanoma with 2 mitoses/mm2. Peripheral margin was close with melanoma in situ. She underwent wide local excision and sentinel lymph node biopsy on 05/19/14 which showed no residual melanoma at primary site and two negative left supraclavicular lymph nodes. Pre op CXR and LDH were normal.    No personal/family history of skin cancer. No tanning bed use. No blistering sunburns.     Of note, lipoma (confirmed on biopsy and FISH November 2016) on left lateral thigh was removed, still has some occasional sensitivity and nerve pain at site induced by certain movements.    She was diagnosed with gout, discovered during surgery for removal of what was thought to be a vascular lesion of the right heel.    Current Outpatient Prescriptions   Medication Sig Dispense Refill   . allopurinol (ZYLOPRIM) 100 MG tablet allopurinol 100 mg tablet     . aspirin 120 MG suppository aspirin   81 mg qd     . aspirin EC 81 MG EC tablet Take 81 mg by mouth daily.     Marland Kitchen atorvastatin (LIPITOR) 10 MG tablet Take one tablet by mouth one time daily 30 tablet 0   . atorvastatin (LIPITOR) 20 MG tablet atorvastatin 20 mg  tablet     . EPINEPHrine (EPIPEN 2-PAK) 0.3 MG/0.3ML Solution Auto-injector injection EpiPen 2-Pak 0.3 mg/0.3 mL injection, auto-injector     . estrogens, conjugated, (PREMARIN) 0.3 MG tablet Premarin 0.3 mg tablet   TAKE ONE TABLET BY MOUTH ONE TIME DAILY     . estrogens, conjugated, (PREMARIN) 0.45 MG tablet Premarin 0.45 mg tablet     . Fexofenadine HCl (MUCINEX ALLERGY PO) Take by mouth.     Marland Kitchen HYDROcodone-acetaminophen (NORCO) 5-325 MG per tablet hydrocodone 5 mg-acetaminophen 325 mg tablet     . indomethacin (INDOCIN) 50 MG capsule      . influenza quadrivalent-split vaccine, PF, (FLUARIX QUADRIVALENT) IM injection Fluarix Quad 2017-2018 (PF) 60 mcg (15 mcg x 4)/0.5 mL IM syringe     . levothyroxine (SYNTHROID, LEVOTHROID) 25 MCG tablet levothyroxine 25 mcg tablet     . loratadine (CLARITIN) 10 MG tablet Take 10 mg by mouth as needed.        . loratadine (CLARITIN) 10 MG tablet Claritin 10 mg tablet   Take 1 tablet as needed by oral route.     . Omega-3 Fatty Acids (FISH OIL) 1200 MG CAPS Take by mouth 2 (two) times daily.Essential Oil Omega Complex- 2tab qd         . oxyCODONE-acetaminophen (PERCOCET) 5-325 MG per tablet oxycodone-acetaminophen 5 mg-325 mg tablet     . oxyCODONE-acetaminophen (PERCOCET) 7.5-325 MG per tablet oxycodone-acetaminophen 7.5 mg-325 mg tablet     .  progesterone (PROMETRIUM) 200 MG capsule progesterone micronized 200 mg capsule   TAKE ONE CAPSULE BY MOUTH AT BEDTIME     . UNABLE TO FIND Copaiba (Colpaifera Essential Oil- 1tab qd     . UNABLE TO FIND Micro Plex (Food Nutrient Complex) Twice daily.     Marland Kitchen UNABLE TO FIND Polyphenol Complex (deep blue) twice daily per pt.     . Zoster Vac Recomb Adjuvanted (SHINGRIX) 50 MCG/0.5ML Recon Susp Shingrix (PF) 50 mcg/0.5 mL intramuscular suspension, kit       No current facility-administered medications for this visit.      Allergies   Allergen Reactions   . Ciprofloxacin Swelling and Rash   . Cefaclor Hives     Past Medical History:   Diagnosis  Date   . Claustrophobia    . Hyperlipidemia    . Lipoma     base of posterior neck   . Low back pain    . Malignant neoplasm of skin     Melanoma on left shoulder back   . Plantar fasciitis, right    . Rash    . Seasonal allergies      Past Surgical History:   Procedure Laterality Date   . BIOPSY, SENTINEL NODE N/A 05/19/2014    Procedure: BIOPSY, SENTINEL NODE;  Surgeon: Lorenza Chick, MD;  Location: [facility] OR;  Service: General;  Laterality: N/A;   SENTINEL LYMPH NODE BIOSPY W/ NUC MED @ 8:00AM,    . EXCISION, FOOT LESION Right 12/04/2016    Procedure: EXCISION, FOOT LESION;  Surgeon: Cathie Hoops, DPM;  Location: [facility] OR;  Service: Podiatry;  Laterality: Right;  RIGHT FOOT SOFT TISSUE MASS RESECTION   . EXCISION, LIPOMA N/A 11/24/2014    Procedure: EXCISION, LIPOMA OF BASE OF NECK;  Surgeon: Lorenza Chick, MD;  Location: [facility];  Service: General;  Laterality: N/A;  BACK LIPOMA EXCISION   . EXCISION, MASS Left 04/18/2015    Procedure: EXCISION, MASS;  Surgeon: Lorenza Chick, MD;  Location: [facility] OR;  Service: General;  Laterality: Left;  LEFT THIGH MASS EXCISION   . EXCISION, MELANOMA Bilateral 05/19/2014    Procedure: EXCISION, MELANOMA;  Surgeon: Lorenza Chick, MD;  Location: [facility] OR;  Service: General;  Laterality: Bilateral;  LEFT SHOULDER MELANOMA WIDE LOCAL EXCISION, RIGHT MID-BACK ATYPICAL NEVUS EXCISION     . EYE SURGERY     . LASIK  2003   . SKIN BIOPSY       Family History   Problem Relation Age of Onset   . Hypertension Father    . Hyperlipidemia Father    . Aneurysm Father    . Heart disease Father         unsure of age   . Asthma Brother    . Hyperlipidemia Brother    . ADD / ADHD Daughter    . Asthma Daughter    . Alcohol abuse Maternal Uncle    . Alcohol abuse Maternal Grandfather    . Breast cancer Maternal Grandmother    . Cancer Maternal Grandmother         breast   . Thyroid disease Sister      Social History     Social History   . Marital status:  Single     Spouse name: N/A   . Number of children: N/A   . Years of education: N/A     Occupational History   . Not on file.  Social History Main Topics   . Smoking status: Former Smoker     Packs/day: 1.00     Years: 30.00     Quit date: 10/29/2002   . Smokeless tobacco: Never Used   . Alcohol use 0.0 oz/week      Comment: drinks wine nightly (2 glasses); very rare other alcohol   . Drug use: No   . Sexual activity: Yes     Partners: Male     Birth control/ protection: None     Other Topics Concern   . Not on file     Social History Narrative   . No narrative on file       REVIEW OF SYSTEMS  Const - no fever no chills   CV - no chest pain   Resp- no cough  Neuro - no headache   Abd - no pain   Integ - see HPI    All other systems reviewed and negative      Objective:     Vitals:    11/27/17 1428   BP: 127/83   Pulse: 84   Temp: 98.2 F (36.8 C)   SpO2: 98%      Constitutional: General appearance: NAD, conversant    HEENT: normocephalic, atraumatic. Conjunctiva, lids, lips, examined with no suspicious lesions    SKIN:A total body skin examination is completed including:   - palpation of scalp and inspection of hair of scalp, eyebrows, face, chest and extremities   - inspection of eccrine and apocrine glands.   - inspection and/or palpation of the skin and subcutaneous tissues with the following anatomic skin sites examined with the following findings:   1. Head including face - no lesions of concern  2. Neck  - no lesions of concern  3. Chest including breasts, and axillae - no lesions of concern  4. Abdomen -   a. Uniformly dark brown 4 x 4 mm minimally elevated on left abdomen superior and lateral to umbilicus (stable)  5. Genitalia, groin, buttocks - no lesions of concern  6. Back -   a. Posterior base of neck-healed scar (excised lipoma site)  b. Midline upper back- 5 x 5 mm light brown macule with darker brown area of pigment superiorly (measures 1 x 2 mm), few hairs coming out and breaking up pigment  (stable compared to photos in Mirror, monitor)  c. Left lateral mid back- 2 x 2 mm skin colored papule with a 2 x 1 mm irregular streaky brown pigment (changed vs. TBI- new papular component, tangential shave biopsy)  7. Right upper extremity -   a. Right axilla- 3 x 2 mm brown macule (stable, continue to monitor)  8. Left upper extremity -   a. posterior shoulder 6 cm well healed scar (MM site 10/15) no pigmentation or nodulatrity  9. Right lower extremity -  10. Left lower extremity -   a. Left outer thigh - transverse scar 10cm (lipoma excision)- posterior aspect is a 4mm heart shaped dark brown macule (stable)     - Dermatoscopy was used to evaluate the nevi.   - General skin: multiple brown macules, size 2-74mm, approximately 50 in number, distributed primarily on the trunk. Extensive photodamage and rhytids    LYMPHATIC:Examination of lymph node basins is completed, including the H and N, axilla and groin. There is no lymphadenopathy.    NECK: supple, no masses    CVS: no swelling, varicosities or edema    EXTREMITIES: inspection and palpation of digits  and nails revealed no abnormalities    NEURO/PSYCH: alert and oriented, pleasant      Pathology reports   Date 04/06/14  Accession Number [id number])  Location Lt Post Shoulder  Dx MM   Clark Level IV   Thickness 1.2mm   Mitotic Rate 2 per mm   Ulceration Absent   Regression Absent     Procedure: Tangential Shave Biopsy  Location: Left lateral mid back  After discussing the benefits and risks of a biopsy including pain, infection, bleeding and/ or scarring, informed consent was obtained to perform a biopsy. A time out was taken to confirm the patient's name, date of birth and anatomic site of the lesion. The area was then prepped with alcohol in the usual sterile fashion and anesthetized with approximately 1.5 cc of 1% lidocaine with epinephrine. Shave biopsy performed with a dermablade. A thick disk of tissue is removed extending at least into the  mid-dermis. Hemostasis was controlled with aluminum chloride solution and electrodesiccation. The site was dressed with hydrated petrolatum and a band-aide. The patient tolerated the procedure well and was provided verbal and written wound care instructions. The patient will be notified of the results in 1-2 weeks.      Assessment & Plan:   62 y.o. female with a history of Stage IB Left posterior shoulder 04/06/14 s/p WLE and negative SLNB. Presents for Total body skin examination (TBSE).    1. Neoplasm of uncertain behavior of skin  -Left lateral mid back, tangential shave biopsy, r/o DN vs. MM vs. Combined nevus and ISK    2. 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   -Patient has all of her skin checks here. She will alternate between Jeannette Corpus, FNP (due July 2020) and Dr. Ruel Favors (January 2020).  - Annual CXR per Dr. Crosby Oyster for surveillance.    3. Skin cancer screening  - total body skin examination performed today   - no other lesions concerning to warrant biopsy today   - Several have been documented as above and will be followed.   - Sun protection reviewed   - ABCDEs of melanoma reviewed   - Monthly self-skin examination was advised and the patient was instructed to return to clinic prior to scheduled visit if any suspicious or worrisome findings are discovered    4. Multiple benign appearing nevi  -Provided reassurance    RTC in 6 months (dual visit Dr. Charlann Lange)  RTC in 12 months for TBSE with me.    Jeannette Corpus, DNP, FNP-BC  Nurse Practitioner, [facility]  [facility]  [facility]  [address]  [place], [place] [address]  [phone]  [phone]

## 2017-12-01 ENCOUNTER — Telehealth: Payer: Self-pay

## 2017-12-01 LAB — LAB USE ONLY - HISTORICAL SURGICAL PATHOLOGY

## 2017-12-01 NOTE — Telephone Encounter (Signed)
Unable to reach [facility] at her work so left a message at her home number to inform her of the results of the skin biopsy on her left lateral back on 11/27/17: Seborrheic Keratosis, benign with free margins.  Relayed next appointment at the [facility] is a dual visit with Dr. Ruel Favors and Dr. Crosby Oyster on 06/23/2018. Encouraged patient to call with any questions or concerns.

## 2017-12-15 ENCOUNTER — Telehealth: Payer: Self-pay | Admitting: Dermatology

## 2017-12-15 NOTE — Telephone Encounter (Signed)
Patient called to go over biopsy results from 11/27/2017, results reviewed with patient. Patient wanted to know next steps with follow-ups; informed her per her last office visit note patient to do dual visit wih Dr. Charlann Lange Q6 months and with NP Q12 months. Patient verbalized understanding and had no further questions.

## 2018-02-28 IMAGING — DX DG ANKLE COMPLETE 3+V*R*
3 series · 3 of 3 positions shown · non-contrast
Comparison: None.

CLINICAL DATA: Chronic right ankle pain, worsening today. No
reported injury.

EXAM:
RIGHT ANKLE - COMPLETE 3+ VIEW

[ankle ap]
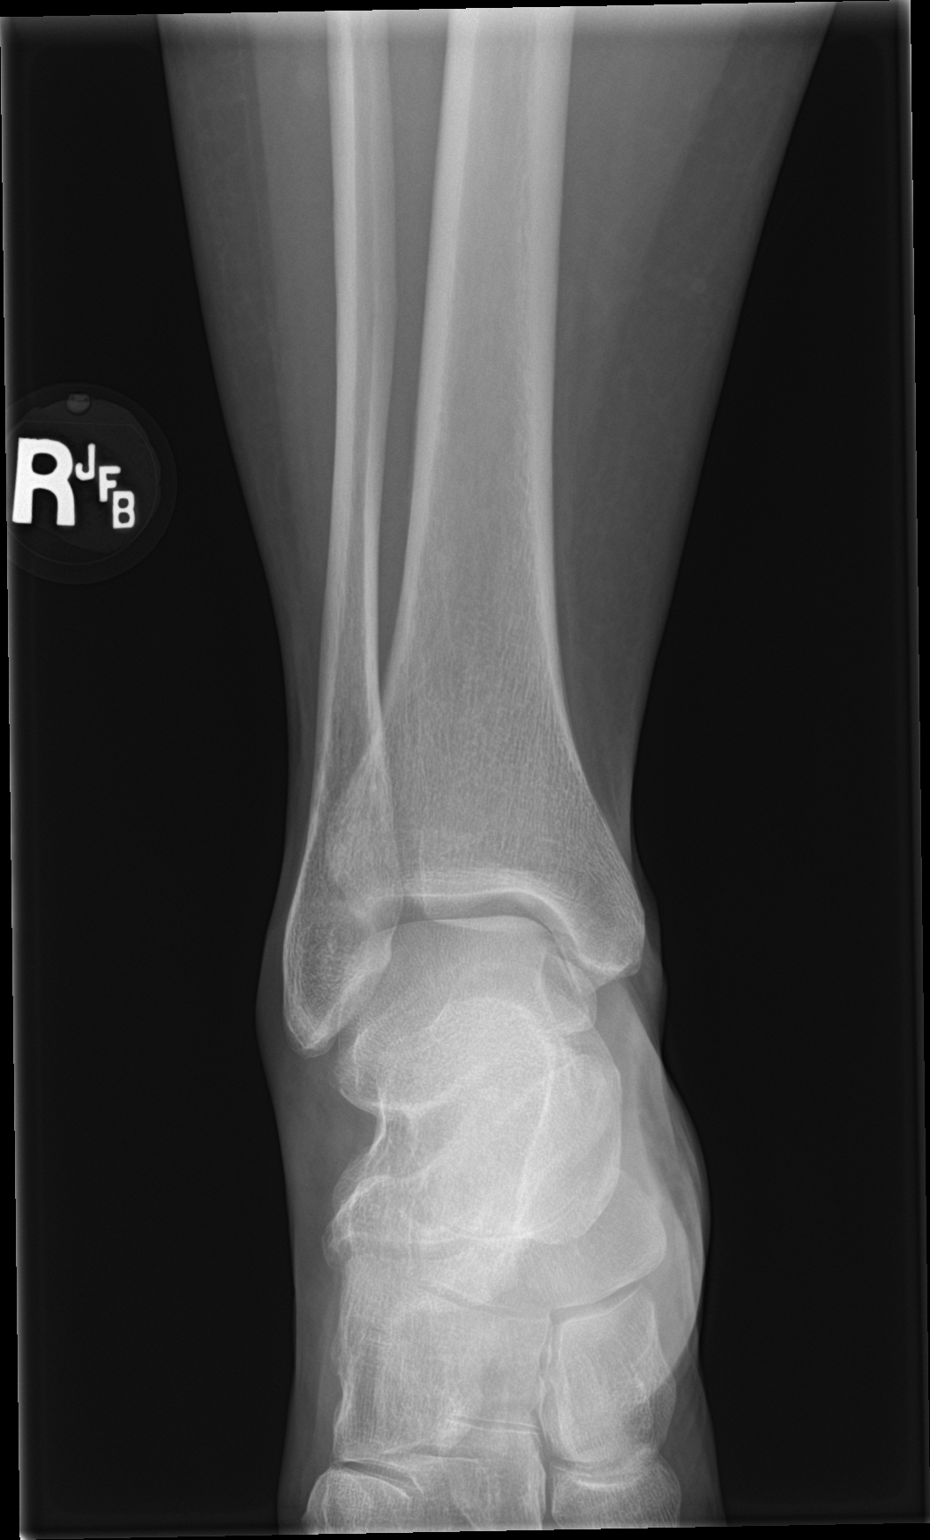

[ankle obl]
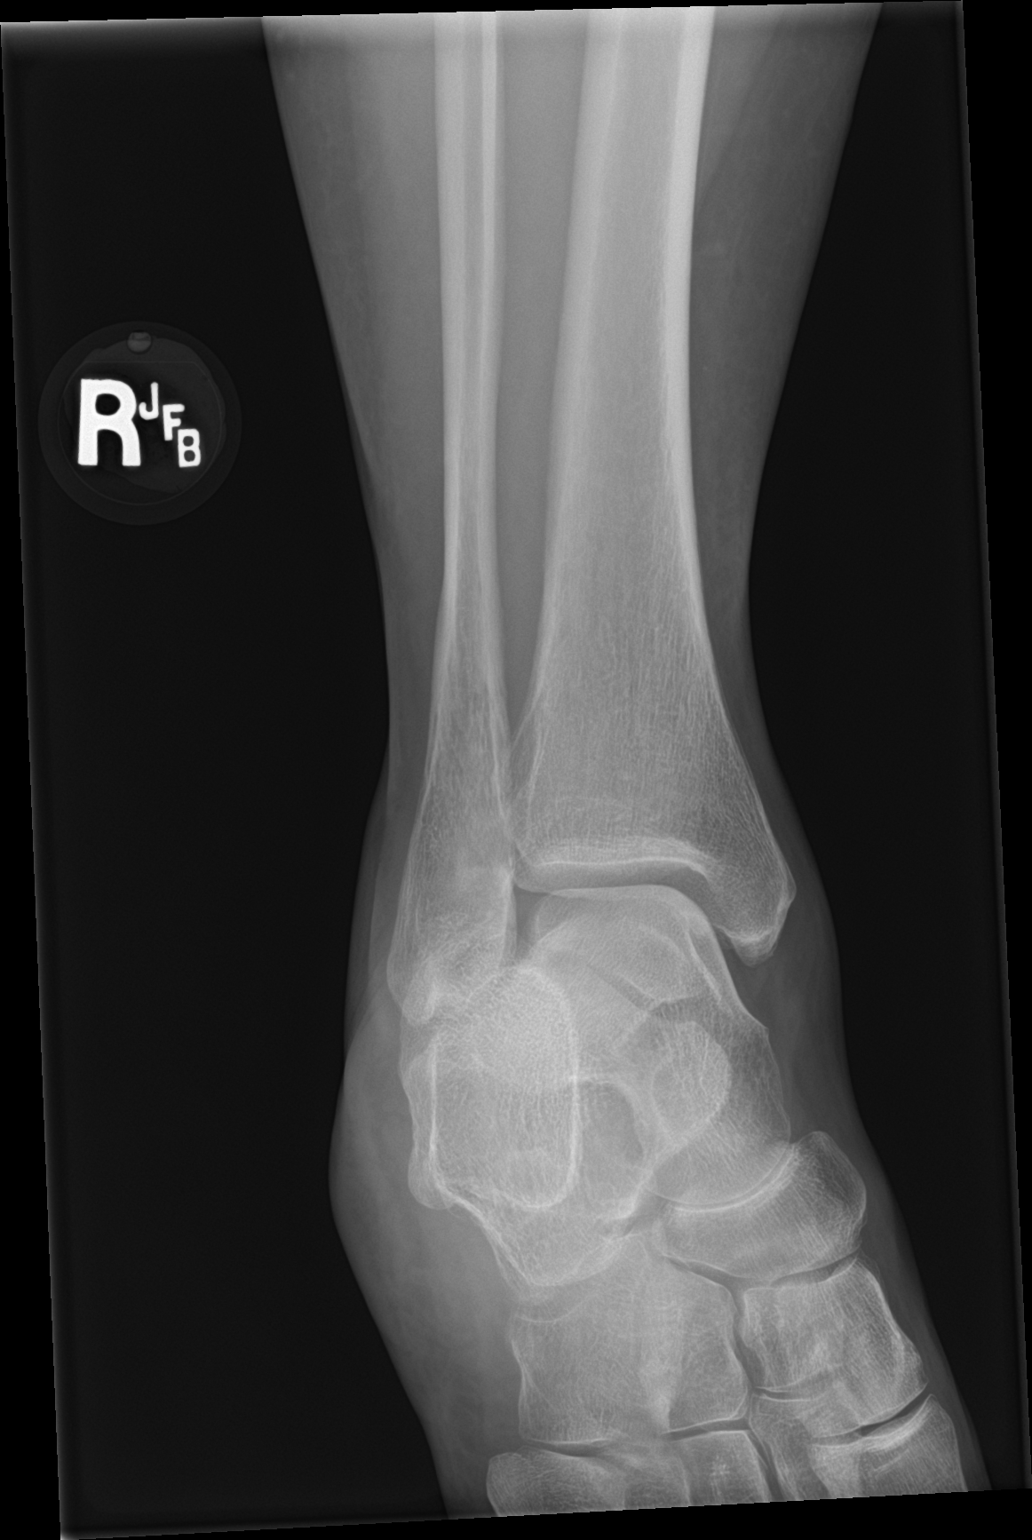

[ankle lat]
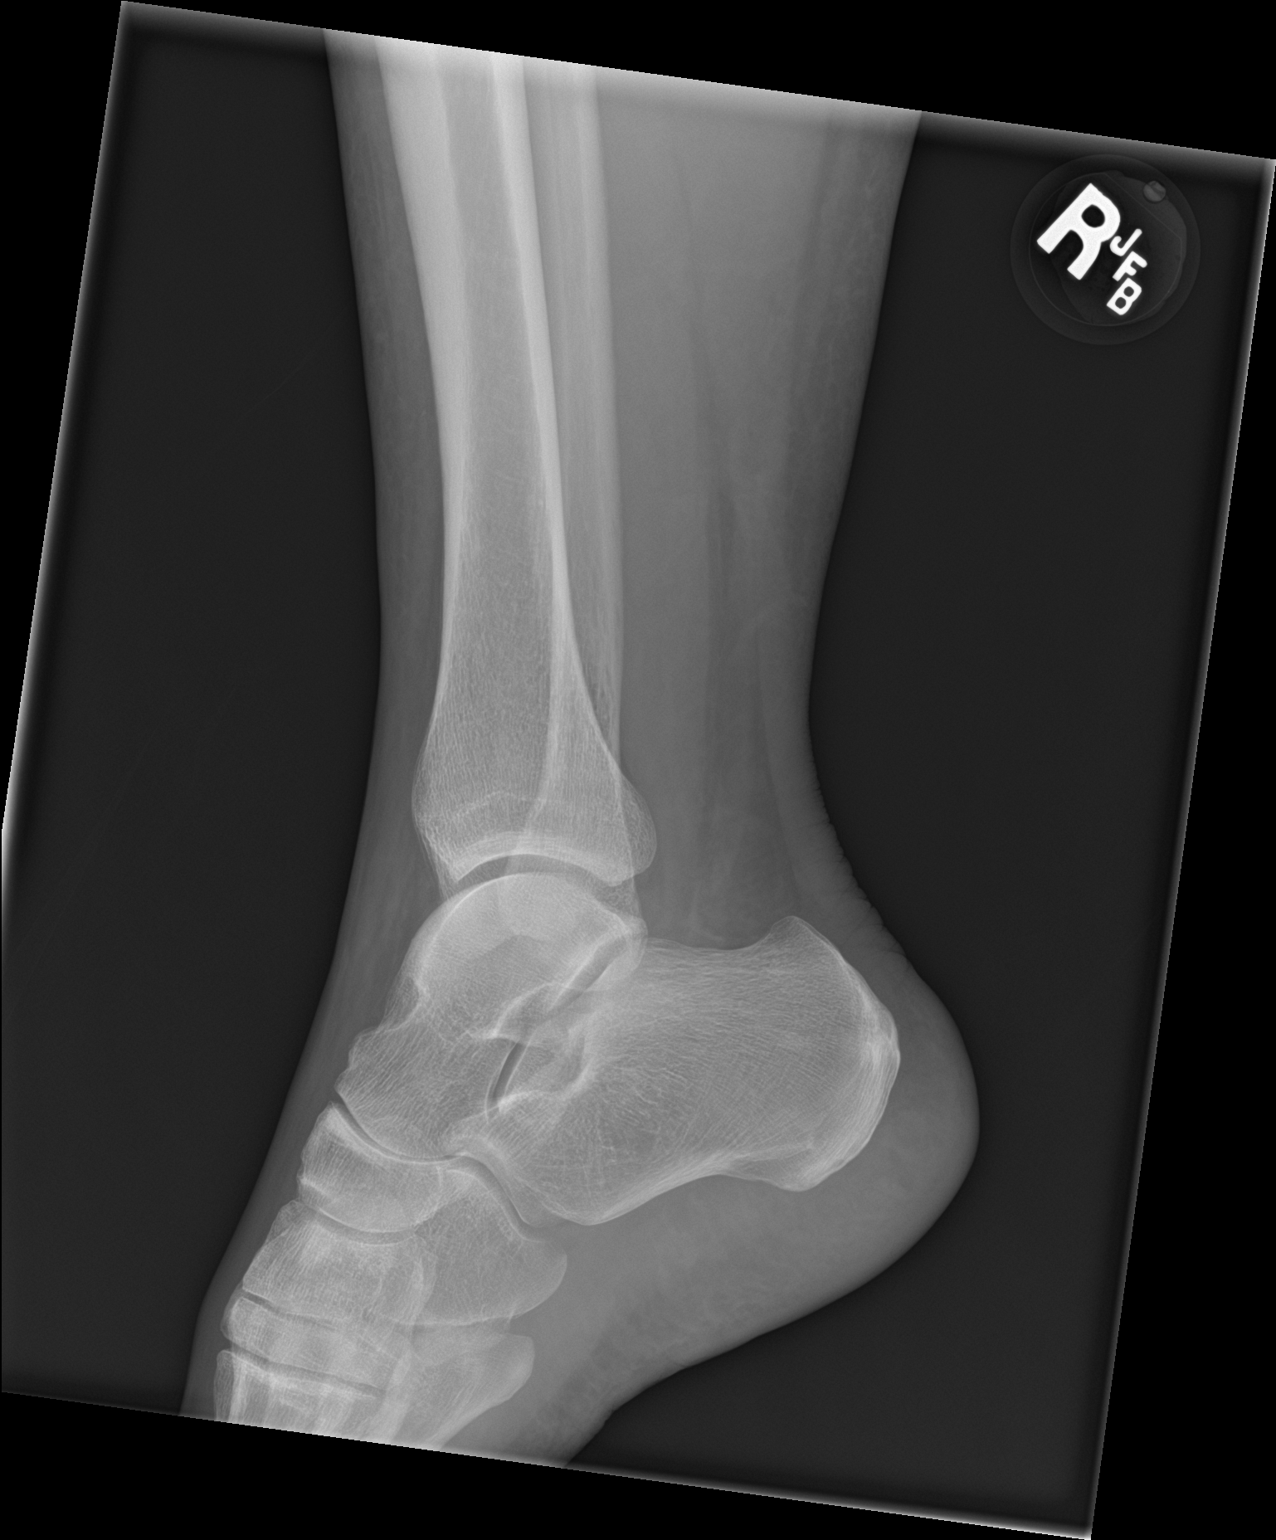

[3 of 3 positions shown; findings below may reference images not displayed]

FINDINGS: There is no evidence of fracture, dislocation, or joint effusion.
There is no evidence of arthropathy or other focal bone abnormality.
Soft tissues are unremarkable.
IMPRESSION: Negative.

## 2018-06-04 ENCOUNTER — Ambulatory Visit
Admission: RE | Admit: 2018-06-04 | Discharge: 2018-06-04 | Disposition: A | Discharge status: Home / Self Care | Payer: Commercial Managed Care - POS | Source: Ambulatory Visit | Attending: Medical | Admitting: Medical

## 2018-06-04 ENCOUNTER — Other Ambulatory Visit: Payer: Self-pay | Admitting: Medical

## 2018-06-04 DIAGNOSIS — Z1231 Encounter for screening mammogram for malignant neoplasm of breast: Secondary | ICD-10-CM

## 2018-06-22 ENCOUNTER — Telehealth: Payer: Self-pay | Admitting: Hematology & Oncology

## 2018-06-23 ENCOUNTER — Ambulatory Visit: Payer: Commercial Managed Care - POS | Admitting: Hematology & Oncology

## 2018-06-23 ENCOUNTER — Telehealth: Payer: Self-pay | Admitting: Family

## 2018-06-23 ENCOUNTER — Encounter: Payer: Self-pay | Admitting: Dermatology

## 2018-06-23 ENCOUNTER — Ambulatory Visit: Payer: Commercial Managed Care - POS | Admitting: Dermatology

## 2018-06-23 ENCOUNTER — Ambulatory Visit
Admission: RE | Admit: 2018-06-23 | Discharge: 2018-06-23 | Disposition: A | Discharge status: Home / Self Care | Payer: Commercial Managed Care - POS | Source: Ambulatory Visit | Attending: Family | Admitting: Family

## 2018-06-23 ENCOUNTER — Other Ambulatory Visit: Payer: Self-pay

## 2018-06-23 ENCOUNTER — Encounter: Payer: Self-pay | Admitting: Hematology & Oncology

## 2018-06-23 VITALS — BP 147/89 | HR 91 | Temp 98.3°F | Ht 62.0 in | Wt 129.0 lb

## 2018-06-23 DIAGNOSIS — C4362 Malignant melanoma of left upper limb, including shoulder: Secondary | ICD-10-CM | POA: Insufficient documentation

## 2018-06-23 DIAGNOSIS — Z1283 Encounter for screening for malignant neoplasm of skin: Secondary | ICD-10-CM

## 2018-06-23 DIAGNOSIS — Z1321 Encounter for screening for nutritional disorder: Secondary | ICD-10-CM

## 2018-06-23 DIAGNOSIS — Z85820 Personal history of malignant melanoma of skin: Secondary | ICD-10-CM

## 2018-06-23 DIAGNOSIS — IMO0002 Reserved for concepts with insufficient information to code with codable children: Secondary | ICD-10-CM

## 2018-06-23 DIAGNOSIS — R2231 Localized swelling, mass and lump, right upper limb: Secondary | ICD-10-CM

## 2018-06-23 DIAGNOSIS — D229 Melanocytic nevi, unspecified: Secondary | ICD-10-CM

## 2018-06-23 NOTE — Progress Notes (Signed)
[facility]    CC: Melanoma follow-up, Total body skin examination (TBSE)    HISTORY OF PRESENT ILLNESS  Interval:   Location:right shoulder lump  Severity: mild  Timing:gradual  Modifying factors:n/a  Quality:n/a  Duration :unsure  Context:n/a  Associated signs and symptoms:growing non tender      Presents today for TBSE on surveillance for melanoma left posterior shoulder. Last TBSE was with [name] was 6 months ago at which time a lesion on the left lateral mid back was biopsied showing SK. Today she denies any new, changing, itching, peeling, bleeding, painful lesions. Denies enlarged or tender lymph nodes.     Detailed:  [name] [name] is 63 y.o. female with a history of Stage 1B Left posterior shoulder. Patient noted a growing mole which became like a "pinkish/purpleish blister"  over the past 5 months, + bleeding. Saw her dermatologist Dr. Christella Noa who recommended excisional biopsy which was performed by Dr. Webb Silversmith on 04/06/14. Pathology showed 1.47 mm thick, non-ulcerated melanoma with 2 mitoses/mm2. Peripheral margin was close with melanoma in situ. She underwent wide local excision and sentinel lymph node biopsy on 05/19/14 which showed no residual melanoma at primary site and two negative left supraclavicular lymph nodes. Pre op CXR and LDH were normal.    No personal/family history of skin cancer. No tanning bed use. No blistering sunburns.     Of note, lipoma (confirmed on biopsy and FISH November 2016) on left lateral thigh was removed, still has some occasional sensitivity and nerve pain at site induced by certain movements.    She was diagnosed with gout, discovered during surgery for removal of what was thought to be a vascular lesion of the right heel.    Current Outpatient Medications   Medication Sig Dispense Refill    allopurinol (ZYLOPRIM) 100 MG tablet allopurinol 100 mg tablet      aspirin 120 MG suppository aspirin   81 mg qd      aspirin EC 81 MG EC  tablet Take 81 mg by mouth daily.      atorvastatin (LIPITOR) 10 MG tablet Take one tablet by mouth one time daily 30 tablet 0    atorvastatin (LIPITOR) 20 MG tablet atorvastatin 20 mg tablet      indomethacin (INDOCIN) 50 MG capsule       influenza quadrivalent-split vaccine, PF, (FLUARIX QUADRIVALENT) IM injection Fluarix Quad 2017-2018 (PF) 60 mcg (15 mcg x 4)/0.5 mL IM syringe      levothyroxine (SYNTHROID, LEVOTHROID) 25 MCG tablet levothyroxine 25 mcg tablet      loratadine (CLARITIN) 10 MG tablet Take 10 mg by mouth as needed.         Omega-3 Fatty Acids (FISH OIL) 1200 MG CAPS Take by mouth 2 (two) times daily.Essential Oil Omega Complex- 2tab qd          UNABLE TO FIND Copaiba (Colpaifera Essential Oil- 1tab qd      UNABLE TO FIND Micro Plex (Food Nutrient Complex) Twice daily.      UNABLE TO FIND Polyphenol Complex (deep blue) twice daily per pt.      Zoster Vac Recomb Adjuvanted (SHINGRIX) 50 MCG/0.5ML Recon Susp Shingrix (PF) 50 mcg/0.5 mL intramuscular suspension, kit       No current facility-administered medications for this visit.      Allergies   Allergen Reactions    Ciprofloxacin Swelling and Rash    Cefaclor Hives     Past Medical History:   Diagnosis Date  Claustrophobia     Hyperlipidemia     Lipoma     base of posterior neck    Low back pain     Malignant neoplasm of skin     Melanoma on left shoulder back    Plantar fasciitis, right     Rash     Seasonal allergies      Past Surgical History:   Procedure Laterality Date    BIOPSY, SENTINEL NODE N/A 05/19/2014    Procedure: BIOPSY, SENTINEL NODE;  Surgeon: Lorenza Chick, MD;  Location: [facility] OR;  Service: General;  Laterality: N/A;   SENTINEL LYMPH NODE BIOSPY W/ NUC MED @ 8:00AM,     EXCISION, FOOT LESION Right 12/04/2016    Procedure: EXCISION, FOOT LESION;  Surgeon: Cathie Hoops, DPM;  Location: [facility] OR;  Service: Podiatry;  Laterality: Right;  RIGHT FOOT SOFT TISSUE MASS RESECTION     EXCISION, LIPOMA N/A 11/24/2014    Procedure: EXCISION, LIPOMA OF BASE OF NECK;  Surgeon: Lorenza Chick, MD;  Location: [facility];  Service: General;  Laterality: N/A;  BACK LIPOMA EXCISION    EXCISION, MASS Left 04/18/2015    Procedure: EXCISION, MASS;  Surgeon: Lorenza Chick, MD;  Location: [facility] OR;  Service: General;  Laterality: Left;  LEFT THIGH MASS EXCISION    EXCISION, MELANOMA Bilateral 05/19/2014    Procedure: EXCISION, MELANOMA;  Surgeon: Lorenza Chick, MD;  Location: [facility] OR;  Service: General;  Laterality: Bilateral;  LEFT SHOULDER MELANOMA WIDE LOCAL EXCISION, RIGHT MID-BACK ATYPICAL NEVUS EXCISION      EYE SURGERY      LASIK  2003    SKIN BIOPSY       Family History   Problem Relation Age of Onset    Hypertension Father     Hyperlipidemia Father     Aneurysm Father     Heart disease Father         unsure of age    Asthma Brother     Hyperlipidemia Brother     ADD / ADHD Daughter     Asthma Daughter     Alcohol abuse Maternal Uncle     Alcohol abuse Maternal Grandfather     Breast cancer Maternal Grandmother     Cancer Maternal Grandmother         breast    Thyroid disease Sister      Social History     Socioeconomic History    Marital status: Single     Spouse name: Not on file    Number of children: Not on file    Years of education: Not on file    Highest education level: Not on file   Occupational History    Not on file   Social Needs    Financial resource strain: Not on file    Food insecurity:     Worry: Not on file     Inability: Not on file    Transportation needs:     Medical: Not on file     Non-medical: Not on file   Tobacco Use    Smoking status: Former Smoker     Packs/day: 1.00     Years: 30.00     Pack years: 30.00     Last attempt to quit: 10/29/2002     Years since quitting: 15.6    Smokeless tobacco: Never Used   Substance and Sexual Activity    Alcohol use: Yes     Alcohol/week: 0.0  standard drinks     Comment: drinks wine nightly (2  glasses); very rare other alcohol    Drug use: No    Sexual activity: Yes     Partners: Male     Birth control/protection: None   Lifestyle    Physical activity:     Days per week: Not on file     Minutes per session: Not on file    Stress: Not on file   Relationships    Social connections:     Talks on phone: Not on file     Gets together: Not on file     Attends religious service: Not on file     Active member of club or organization: Not on file     Attends meetings of clubs or organizations: Not on file     Relationship status: Not on file    Intimate partner violence:     Fear of current or ex partner: Not on file     Emotionally abused: Not on file     Physically abused: Not on file     Forced sexual activity: Not on file   Other Topics Concern    Exposure to radiation? Not Asked    Exposure to other chemicals? Not Asked    Exposure to Asbestos? Not Asked   Social History Narrative    Not on file       REVIEW OF SYSTEMS  Const - no fever no chills   CV - no chest pain   Resp- no cough  Neuro - no headache   Abd - no pain   Integ - see HPI    All other systems reviewed and negative      Objective:     Vitals:    06/23/18 1353   BP: 147/89   Pulse: 91   Temp: 98.3 F (36.8 C)   SpO2: 93%      Constitutional: General appearance: NAD, conversant    HEENT: normocephalic, atraumatic. Conjunctiva, lids, lips, examined with no suspicious lesions    SKIN:A total body skin examination is completed including:   - palpation of scalp and inspection of hair of scalp, eyebrows, face, chest and extremities   - inspection of eccrine and apocrine glands.   - inspection and/or palpation of the skin and subcutaneous tissues with the following anatomic skin sites examined with the following findings:   1. Head including face - no lesions of concern  2. Neck  - no lesions of concern  3. Chest including breasts, and axillae - no lesions of concern  4. Abdomen -   a. Uniformly dark brown 4 x 4 mm minimally elevated on left  abdomen superior and lateral to umbilicus (stable)  5. Genitalia, groin, buttocks - no lesions of concern  6. Back -   a. Posterior base of neck-healed scar (excised lipoma site)  b. Midline upper back- 5 x 5 mm light brown macule with darker brown area of pigment superiorly (measures 1 x 2 mm), few hairs coming out and breaking up pigment (stable compared to photos in Mirror, monitor)  7. Right upper extremity -   a. Right axilla- 3 x 2 mm brown macule (stable, continue to monitor)  b. Right lateral deltoid 7 cm lipoma soft tissue nodule   8. Left upper extremity -   a. posterior shoulder 6 cm well healed scar (MM site 10/15) no pigmentation or nodulatrity  9. Right lower extremity - no lesions of concern  10.  Left lower extremity -   a. Left outer thigh - transverse scar 10cm (lipoma excision)- posterior aspect is a 4mm heart shaped dark brown macule (stable)     - Dermatoscopy was used to evaluate the nevi.   - General skin: multiple brown macules, size 2-56mm, approximately 50 in number, distributed primarily on the trunk. Extensive photodamage and rhytids    LYMPHATIC:Examination of lymph node basins is completed, including the H and N, axilla and groin. There is no lymphadenopathy.    NECK: supple, no masses    CVS: no swelling, varicosities or edema    EXTREMITIES: inspection and palpation of digits and nails revealed no abnormalities    NEURO/PSYCH: alert and oriented, pleasant      Pathology reports   Date 04/06/14  Accession Number [id number])  Location Lt Post Shoulder  Dx MM   Clark Level IV   Thickness 1.47mm   Mitotic Rate 2 per mm   Ulceration Absent   Regression Absent       Assessment & Plan:   63 y.o. female with a history of Stage IB Left posterior shoulder 04/06/14 s/p WLE and negative SLNB. Presents for Total body skin examination (TBSE).    1. Right deltoid 7cm mass, suspicious for lipoma but patient unsure of duration   - per patient is fairly new. Ultrasound today. She is scheduled for  3:30pm today. If it confirms lipoma, we will refer her to DR Willeen Cass.    2. 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   -Patient has all of her skin checks here. She will alternate between Jeannette Corpus, FNP (due July 2020) and Dr. Ruel Favors (January 2021).  - Annual CXR per Dr. Crosby Oyster for surveillance.    3. Skin cancer screening  - total body skin examination performed today   - no other lesions concerning to warrant biopsy today   - Several have been documented as above and will be followed.   - Sun protection reviewed   - ABCDEs of melanoma reviewed   - Monthly self-skin examination was advised and the patient was instructed to return to clinic prior to scheduled visit if any suspicious or worrisome findings are discovered    4. Multiple benign appearing nevi  - Provided reassurance    RTC in 6 months (dual visit Dr. Charlann Lange)  RTC in 12 months for TBSE with NP      R Ionia Schey NP    [facility]    I personally reviewed the patients history, pathology when relevant, and completed an independent skin examination. I agree with all components of this written note. I have made addendums where/if needed. S. VENNA.

## 2018-06-23 NOTE — Telephone Encounter (Signed)
Attempted call patient to advise of the results below unable to reach. Left VM    =========================================================================================uUTRASOUND  HISTORY: Right lateral deltoid.    COMPARISON: None    TECHNIQUE: Pelvic ultrasound of right lateral shoulder in area of  palpable concern. Limited views of left shoulder for comparison.    FINDINGS: No cystic or solid mass seen.    IMPRESSION:    No sonographic evidence of cystic or solid mass  corresponding to palpable concern about right shoulder. Further  management should be based on clinical evaluation.    Clide Cliff, MD   06/23/2018 3:49 PM    CHEST XRAY  HISTORY: Melanoma.    COMPARISON: 07/13/2017    TECHNIQUE: Frontal and lateral views of the chest.    FINDINGS:      The lungs are  clear. There is no pleural effusion or pneumothorax.  Cardiomediastinal silhouette within normal limits. No acute osseous  changes evident.    IMPRESSION:     1. No active disease in chest.

## 2018-06-23 NOTE — Progress Notes (Signed)
[facility]    CC: Melanoma follow-up    HISTORY OF PRESENT ILLNESS  [name] [name] is 63 y.o. female who noted a growing mole in left shoulder which became like "blister" over 5 months. Also noted bleeding from the lesion. Saw her dermatologist Dr. Christella Noa who recommended excisional biopsy which was performed by Dr. Webb Silversmith on 04/06/14. Pathology showed 1.47 mm thick, non-ulcerated melanoma with 2 mitoses/mm2. Peripheral margin was close with melanoma in situ. Preop CXR and LDH were normal. She underwent wide local excision and sentinel lymph node biopsy on 05/19/14 which showed no residual melanoma at primary site and two negative left supraclavicular lymph nodes. She noticed new soft lump in left hip area in August 2016 for which ultrasound showed features most consistent with lipoma. She underwent excision by Dr.Hafner on 04/18/15 which confirmed it to be lipoma.    Last LDH was elevated (265) in March 2017. Repeat LDH in July 2017 was within normal limits (223). CXR 08/22/15 showed no evidence of metastatic disease.     CXR February 2019 showed no evidence of metastatic disease in the chest.    She presents for follow-up on surveillance for melanoma of left posterior shoulder. Denies enlarged or tender lymph nodes.Patient denies any rash/itching, fatigue, weight loss, headache, shortness of breath, nausea, vomiting, diarrhea or abdominal pain.     Current Outpatient Medications   Medication Sig Dispense Refill   . allopurinol (ZYLOPRIM) 100 MG tablet allopurinol 100 mg tablet     . aspirin 120 MG suppository aspirin   81 mg qd     . aspirin EC 81 MG EC tablet Take 81 mg by mouth daily.     Marland Kitchen atorvastatin (LIPITOR) 10 MG tablet Take one tablet by mouth one time daily 30 tablet 0   . indomethacin (INDOCIN) 50 MG capsule      . levothyroxine (SYNTHROID, LEVOTHROID) 25 MCG tablet levothyroxine 25 mcg tablet     . loratadine (CLARITIN) 10 MG tablet Take 10 mg by mouth as needed.         . Omega-3 Fatty Acids (FISH OIL) 1200 MG CAPS Take by mouth 2 (two) times daily.Essential Oil Omega Complex- 2tab qd         . UNABLE TO FIND Copaiba (Colpaifera Essential Oil- 1tab qd     . UNABLE TO FIND Micro Plex (Food Nutrient Complex) Twice daily.     Marland Kitchen UNABLE TO FIND Polyphenol Complex (deep blue) twice daily per pt.     . Zoster Vac Recomb Adjuvanted (SHINGRIX) 50 MCG/0.5ML Recon Susp Shingrix (PF) 50 mcg/0.5 mL intramuscular suspension, kit     . atorvastatin (LIPITOR) 20 MG tablet atorvastatin 20 mg tablet     . influenza quadrivalent-split vaccine, PF, (FLUARIX QUADRIVALENT) IM injection Fluarix Quad 2017-2018 (PF) 60 mcg (15 mcg x 4)/0.5 mL IM syringe       No current facility-administered medications for this visit.      Allergies   Allergen Reactions   . Ciprofloxacin Swelling and Rash   . Cefaclor Hives     Past Medical History:   Diagnosis Date   . Claustrophobia    . Hyperlipidemia    . Lipoma     base of posterior neck   . Low back pain    . Malignant neoplasm of skin     Melanoma on left shoulder back   . Plantar fasciitis, right    . Rash    . Seasonal allergies  Past Surgical History:   Procedure Laterality Date   . BIOPSY, SENTINEL NODE N/A 05/19/2014    Procedure: BIOPSY, SENTINEL NODE;  Surgeon: Lorenza Chick, MD;  Location: [facility] OR;  Service: General;  Laterality: N/A;   SENTINEL LYMPH NODE BIOSPY W/ NUC MED @ 8:00AM,    . EXCISION, FOOT LESION Right 12/04/2016    Procedure: EXCISION, FOOT LESION;  Surgeon: Cathie Hoops, DPM;  Location: [facility] OR;  Service: Podiatry;  Laterality: Right;  RIGHT FOOT SOFT TISSUE MASS RESECTION   . EXCISION, LIPOMA N/A 11/24/2014    Procedure: EXCISION, LIPOMA OF BASE OF NECK;  Surgeon: Lorenza Chick, MD;  Location: [facility];  Service: General;  Laterality: N/A;  BACK LIPOMA EXCISION   . EXCISION, MASS Left 04/18/2015    Procedure: EXCISION, MASS;  Surgeon: Lorenza Chick, MD;  Location: [facility] OR;  Service: General;   Laterality: Left;  LEFT THIGH MASS EXCISION   . EXCISION, MELANOMA Bilateral 05/19/2014    Procedure: EXCISION, MELANOMA;  Surgeon: Lorenza Chick, MD;  Location: [facility] OR;  Service: General;  Laterality: Bilateral;  LEFT SHOULDER MELANOMA WIDE LOCAL EXCISION, RIGHT MID-BACK ATYPICAL NEVUS EXCISION     . EYE SURGERY     . LASIK  2003   . SKIN BIOPSY       Family History   Problem Relation Age of Onset   . Hypertension Father    . Hyperlipidemia Father    . Aneurysm Father    . Heart disease Father         unsure of age   . Asthma Brother    . Hyperlipidemia Brother    . ADD / ADHD Daughter    . Asthma Daughter    . Alcohol abuse Maternal Uncle    . Alcohol abuse Maternal Grandfather    . Breast cancer Maternal Grandmother    . Cancer Maternal Grandmother         breast   . Thyroid disease Sister      Social History     Socioeconomic History   . Marital status: Single     Spouse name: Not on file   . Number of children: Not on file   . Years of education: Not on file   . Highest education level: Not on file   Occupational History   . Not on file   Social Needs   . Financial resource strain: Not on file   . Food insecurity:     Worry: Not on file     Inability: Not on file   . Transportation needs:     Medical: Not on file     Non-medical: Not on file   Tobacco Use   . Smoking status: Former Smoker     Packs/day: 1.00     Years: 30.00     Pack years: 30.00     Last attempt to quit: 10/29/2002     Years since quitting: 15.6   . Smokeless tobacco: Never Used   Substance and Sexual Activity   . Alcohol use: Yes     Alcohol/week: 0.0 standard drinks     Comment: drinks wine nightly (2 glasses); very rare other alcohol   . Drug use: No   . Sexual activity: Yes     Partners: Male     Birth control/protection: None   Lifestyle   . Physical activity:     Days per week: Not on file     Minutes  per session: Not on file   . Stress: Not on file   Relationships   . Social connections:     Talks on phone: Not on file     Gets  together: Not on file     Attends religious service: Not on file     Active member of club or organization: Not on file     Attends meetings of clubs or organizations: Not on file     Relationship status: Not on file   . Intimate partner violence:     Fear of current or ex partner: Not on file     Emotionally abused: Not on file     Physically abused: Not on file     Forced sexual activity: Not on file   Other Topics Concern   . Exposure to radiation? Not Asked   . Exposure to other chemicals? Not Asked   . Exposure to Asbestos? Not Asked   Social History Narrative   . Not on file       REVIEW OF SYSTEMS  All other systems were reviewed and are negative except as previously noted in the HPI.       Objective:     Vitals:    06/23/18 1340   BP: 147/89   Pulse: 91   Temp: 98.3 F (36.8 C)   SpO2: 93%       ECOG PS: 0  General: Vitals Reviewed. NAD  Eyes: Anicteric, normal conjunctiva  ENT: No sores in mouth  CV: RRR  RESP: CTA B  GI: NT/ND, no HSM  Neuro: AAO x 3  MS: no swelling in arms or legs  SKIN: 8 cm wide local excision scar in left shoulder without swelling, bleeding or tenderness. No evidence of satellite or in-transit lesion. 10cm excision scar in left lateral upper thigh  LYMPH: negative cervical suplarclavicular axillary inguinal.     Pathology reports   Date 04/06/14  Accession Number [id number])  Location Lt Post Shoulder  Dx MM   Clark Level IV   Thickness 1.54mm   Mitotic Rate 2 per mm   Ulceration Absent   Regression Absent       Assessment & Plan:   1. Melanoma: Stage 1B Left posterior shoulder, s/p WLE and negative SLNB December 2015   -No clinical evidence of recurrence.   -Repeat CXR today. If no evidence of metastatic disease, RTC in 1 year.   -Will plan for one more office visit with CXR after which patient can see her PCP for nodal exams  -Skin exam by Dr. Ruel Favors today.    RTC in 1 year.    Tiney Rouge, MD   Medical Oncology  Director, Melanoma and Cutaneous Oncology Therapeutics and  Research  [facility]  P [phone]

## 2018-06-24 ENCOUNTER — Other Ambulatory Visit: Payer: Self-pay | Admitting: Family

## 2018-06-25 LAB — VITAMIN D,25 OH,TOTAL: Vitamin D 25-Hydroxy: 30.9 ng/mL (ref 30.0–100.0)

## 2018-06-30 ENCOUNTER — Telehealth: Payer: Self-pay

## 2018-06-30 NOTE — Telephone Encounter (Signed)
LM to call us to review Vit D result with patient.

## 2018-07-02 ENCOUNTER — Telehealth: Payer: Self-pay

## 2018-07-02 NOTE — Telephone Encounter (Signed)
LM that her Vitamin D level is in the normal range and since she is avoiding the sun due to her history of melanoma. She needs to take Vitamin D 2000 iu daily per Nelva Nay, NP.

## 2018-10-12 NOTE — Progress Notes (Signed)
[name] [name] from [facility] called needing another ICD 10 code for her Vitamin D level check-Z13.21

## 2019-01-27 ENCOUNTER — Telehealth: Payer: Self-pay

## 2019-01-27 NOTE — Telephone Encounter (Signed)
Left a vm for abt her insurance status as far as the Monday appt is concern.expects pt to call back with her insurance information.call back number was also confirmed.

## 2019-01-28 ENCOUNTER — Telehealth: Payer: Self-pay | Admitting: Dermatology

## 2019-01-28 NOTE — Telephone Encounter (Signed)
lvm to confirm apt for 8/24 w/ lucey . Advised to come 15 min prior for prescreening and paper work. Confirmed our address and phone number. Advised to call back to confirm apt and to do prescreening questions. Advised to call back with insurance info

## 2019-01-31 ENCOUNTER — Ambulatory Visit: Payer: No Typology Code available for payment source | Attending: Dermatology | Admitting: Dermatology

## 2019-01-31 ENCOUNTER — Encounter: Payer: Self-pay | Admitting: Dermatology

## 2019-01-31 VITALS — BP 129/85 | HR 84 | Temp 97.9°F | Resp 16 | Wt 125.8 lb

## 2019-01-31 DIAGNOSIS — D229 Melanocytic nevi, unspecified: Secondary | ICD-10-CM

## 2019-01-31 DIAGNOSIS — Z85820 Personal history of malignant melanoma of skin: Secondary | ICD-10-CM

## 2019-01-31 DIAGNOSIS — Z1283 Encounter for screening for malignant neoplasm of skin: Secondary | ICD-10-CM

## 2019-01-31 DIAGNOSIS — D179 Benign lipomatous neoplasm, unspecified: Secondary | ICD-10-CM

## 2019-02-02 ENCOUNTER — Telehealth: Payer: Self-pay | Admitting: Dermatology

## 2019-02-02 NOTE — Telephone Encounter (Signed)
Called patient to schedule her 6 month follow up with Dr Sherlean Foot & Dr Crosby Oyster in January or February advised pt to contact the office to schedule her apt.

## 2019-02-02 NOTE — Progress Notes (Signed)
[facility]    CC: Melanoma follow-up, Total body skin examination (TBSE), new tender lumps on arms    [name] [name] is 63 y.o. female with a history of Stage 1B Left posterior shoulder. Patient noted a growing mole which became like a "pinkish/purpleish blister"  over  5 months, + bleeding. Saw her dermatologist Dr. Christella Noa who recommended excisional biopsy which was performed by Dr. Webb Silversmith on 04/06/14. Pathology showed 1.47 mm thick, non-ulcerated melanoma with 2 mitoses/mm2. Peripheral margin was close with melanoma in situ. She underwent wide local excision and sentinel lymph node biopsy on 05/19/14 which showed no residual melanoma at primary site and two negative left supraclavicular lymph nodes. Pre op CXR and LDH were normal.    No personal/family history of skin cancer. No tanning bed use. No blistering sunburns.     Of note, lipoma (confirmed on biopsy and FISH November 2016) on left lateral thigh was removed, still has some occasional sensitivity and nerve pain at site induced by certain movements. She has also had another lipoma removed on her posterior neck in the past.        Her last visit was in 1/20 with Dr. Ruel Favors, at which time a large SQ nodule/lipoma was noted on the R upper arm - US done with showed no evidence of cystic or solid mass.     Today she complains of 2 new lumps/lipomas on the L arm forearm, which are growing rapidly. She would like them removed as they are interfering with her wearing a watch.     Otherwise she denies any  new, changing, bleeding, or itching moles and denies any tender or palpable lymph nodes.  She feels well.       She was diagnosed with gout, discovered during surgery for removal of what was thought to be a vascular lesion of the right heel.    Current Outpatient Medications   Medication Sig Dispense Refill   . allopurinol (ZYLOPRIM) 100 MG tablet allopurinol 100 mg tablet     . aspirin 120 MG suppository aspirin   81 mg qd     .  aspirin EC 81 MG EC tablet Take 81 mg by mouth daily.     Marland Kitchen atorvastatin (LIPITOR) 10 MG tablet Take one tablet by mouth one time daily 30 tablet 0   . atorvastatin (LIPITOR) 20 MG tablet atorvastatin 20 mg tablet     . indomethacin (INDOCIN) 50 MG capsule      . influenza quadrivalent-split vaccine, PF, (FLUARIX QUADRIVALENT) IM injection Fluarix Quad 2017-2018 (PF) 60 mcg (15 mcg x 4)/0.5 mL IM syringe     . levothyroxine (SYNTHROID, LEVOTHROID) 25 MCG tablet levothyroxine 25 mcg tablet     . loratadine (CLARITIN) 10 MG tablet Take 10 mg by mouth as needed.        . Omega-3 Fatty Acids (FISH OIL) 1200 MG CAPS Take by mouth 2 (two) times daily.Essential Oil Omega Complex- 2tab qd         . UNABLE TO FIND Copaiba (Colpaifera Essential Oil- 1tab qd     . UNABLE TO FIND Micro Plex (Food Nutrient Complex) Twice daily.     Marland Kitchen UNABLE TO FIND Polyphenol Complex (deep blue) twice daily per pt.     . Zoster Vac Recomb Adjuvanted (SHINGRIX) 50 MCG/0.5ML Recon Susp Shingrix (PF) 50 mcg/0.5 mL intramuscular suspension, kit       No current facility-administered medications for this visit.      Allergies  Allergen Reactions   . Ciprofloxacin Swelling and Rash   . Cefaclor Hives     Past Medical History:   Diagnosis Date   . Claustrophobia    . Hyperlipidemia    . Lipoma     base of posterior neck   . Low back pain    . Malignant neoplasm of skin     Melanoma on left shoulder back   . Plantar fasciitis, right    . Rash    . Seasonal allergies      Past Surgical History:   Procedure Laterality Date   . BIOPSY, SENTINEL NODE N/A 05/19/2014    Procedure: BIOPSY, SENTINEL NODE;  Surgeon: Lorenza Chick, MD;  Location: [facility] OR;  Service: General;  Laterality: N/A;   SENTINEL LYMPH NODE BIOSPY W/ NUC MED @ 8:00AM,    . EXCISION, FOOT LESION Right 12/04/2016    Procedure: EXCISION, FOOT LESION;  Surgeon: Cathie Hoops, DPM;  Location: [facility] OR;  Service: Podiatry;  Laterality: Right;  RIGHT FOOT SOFT TISSUE MASS  RESECTION   . EXCISION, LIPOMA N/A 11/24/2014    Procedure: EXCISION, LIPOMA OF BASE OF NECK;  Surgeon: Lorenza Chick, MD;  Location: [facility];  Service: General;  Laterality: N/A;  BACK LIPOMA EXCISION   . EXCISION, MASS Left 04/18/2015    Procedure: EXCISION, MASS;  Surgeon: Lorenza Chick, MD;  Location: [facility] OR;  Service: General;  Laterality: Left;  LEFT THIGH MASS EXCISION   . EXCISION, MELANOMA Bilateral 05/19/2014    Procedure: EXCISION, MELANOMA;  Surgeon: Lorenza Chick, MD;  Location: [facility] OR;  Service: General;  Laterality: Bilateral;  LEFT SHOULDER MELANOMA WIDE LOCAL EXCISION, RIGHT MID-BACK ATYPICAL NEVUS EXCISION     . EYE SURGERY     . LASIK  2003   . SKIN BIOPSY       Family History   Problem Relation Age of Onset   . Hypertension Father    . Hyperlipidemia Father    . Aneurysm Father    . Heart disease Father         unsure of age   . Asthma Brother    . Hyperlipidemia Brother    . ADD / ADHD Daughter    . Asthma Daughter    . Alcohol abuse Maternal Uncle    . Alcohol abuse Maternal Grandfather    . Breast cancer Maternal Grandmother    . Cancer Maternal Grandmother         breast   . Thyroid disease Sister      Social History     Socioeconomic History   . Marital status: Single     Spouse name: Not on file   . Number of children: Not on file   . Years of education: Not on file   . Highest education level: Not on file   Occupational History   . Not on file   Social Needs   . Financial resource strain: Not on file   . Food insecurity     Worry: Not on file     Inability: Not on file   . Transportation needs     Medical: Not on file     Non-medical: Not on file   Tobacco Use   . Smoking status: Former Smoker     Packs/day: 1.00     Years: 30.00     Pack years: 30.00     Last attempt to quit: 10/29/2002     Years  since quitting: 16.2   . Smokeless tobacco: Never Used   Substance and Sexual Activity   . Alcohol use: Yes     Alcohol/week: 0.0 standard drinks     Comment: drinks wine  nightly (2 glasses); very rare other alcohol   . Drug use: No   . Sexual activity: Yes     Partners: Male     Birth control/protection: None   Lifestyle   . Physical activity     Days per week: Not on file     Minutes per session: Not on file   . Stress: Not on file   Relationships   . Social Wellsite geologist on phone: Not on file     Gets together: Not on file     Attends religious service: Not on file     Active member of club or organization: Not on file     Attends meetings of clubs or organizations: Not on file     Relationship status: Not on file   . Intimate partner violence     Fear of current or ex partner: Not on file     Emotionally abused: Not on file     Physically abused: Not on file     Forced sexual activity: Not on file   Other Topics Concern   . Exposure to radiation? Not Asked   . Exposure to other chemicals? Not Asked   . Exposure to Asbestos? Not Asked   Social History Narrative   . Not on file       REVIEW OF SYSTEMS  Const - no fever no chills   CV - no chest pain   Resp- no cough  Neuro - no headache   Abd - no pain   Integ - see HPI    All other systems reviewed and negative      Objective:     Vitals:    01/31/19 1534   BP: 129/85   Pulse: 84   Resp: 16   Temp: 97.9 F (36.6 C)   SpO2: 98%      Constitutional: General appearance: NAD, conversant    HEENT: normocephalic, atraumatic. Conjunctiva, lids, lips, examined with no suspicious lesions    SKIN:A total body skin examination is completed including:   - palpation of scalp and inspection of hair of scalp, eyebrows, face, chest and extremities   - inspection of eccrine and apocrine glands.   - inspection and/or palpation of the skin and subcutaneous tissues with the following anatomic skin sites examined with the following findings:   1. Head including face - no lesions of concern  2. Neck  - no lesions of concern  3. Chest including breasts, and axillae - no lesions of concern  4. Abdomen -   a. Uniformly dark brown 4 x 4 mm  minimally elevated on left abdomen superior and lateral to umbilicus (stable)  b. L mid flank 7 mm x 3 mm dark brown macule, reticular network   5. Genitalia, groin, buttocks - no lesions of concern  6. Back -   a. Posterior base of neck-healed scar (excised lipoma site)  b. Midline upper back- 5 x 5 mm light brown macule with darker brown area of pigment superiorly (measures 1 x 2 mm), few hairs coming out and breaking up pigment (stable compared to photos in Mirror, monitor)  7. Right upper extremity -   a. Right axilla- 3 x 2 mm brown macule (stable, continue to  monitor)  b. Right lateral deltoid 7 cm lipoma soft tissue nodule   8. Left upper extremity -   a. posterior shoulder 6 cm well healed scar (MM site 10/15) no pigmentation or nodularity  b. L dorsal distal forearm 7 cm x 5 cm soft mobile SQ nodule, likely lipoma  c. L inner flexor forearm 5 cm x 6 cm soft mobile SQ nodule, likely lipoma  9. Right lower extremity - no lesions of concern  10. Left lower extremity -   a. Left outer thigh - transverse scar 10cm (lipoma excision)- posterior aspect is a 4mm heart shaped dark brown macule (stable)     - Dermatoscopy was used to evaluate the nevi.   - General skin: multiple brown macules, size 2-2mm, approximately 50 in number, distributed primarily on the trunk. Extensive photodamage and rhytids    LYMPHATIC:Examination of lymph node basins is completed, including the H and N, axilla and groin. There is no lymphadenopathy.    NECK: supple, no masses    CVS: no swelling, varicosities or edema    EXTREMITIES: inspection and palpation of digits and nails revealed no abnormalities    NEURO/PSYCH: alert and oriented, pleasant      Pathology reports   Date 04/06/14  Accession Number [id number])  Location Lt Post Shoulder  Dx MM   Clark Level IV   Thickness 1.50mm   Mitotic Rate 2 per mm   Ulceration Absent   Regression Absent       Assessment & Plan:   63 y.o. female with a history of Stage IB Left posterior  shoulder 04/06/14 s/p WLE and negative SLNB. Presents for Total body skin examination (TBSE).    1. 2 new SQ nodules on the L forearm - likely lipomas (she has a hx of multiple lipomas in the past, s/p excision by Dr. Carylon Perches)   - referred her to Dr. Carylon Perches for excision    2. Hx of Melanoma: Stage IB Left posterior shoulder 03/2014  -No evidence of recurrence on exam  -Continue TBSE 6 months x 3 years until October 2020, then annually.   -Patient has all of her skin checks here.  - Annual CXR per Dr. Crosby Oyster for surveillance.    3. Skin cancer screening  - total body skin examination performed today   - no  lesions concerning to warrant biopsy today   - Several have been documented as above and will be followed.   - Sun protection reviewed   - ABCDEs of melanoma reviewed   - Monthly self-skin examination was advised and the patient was instructed to return to clinic prior to scheduled visit if any suspicious or worrisome findings are discovered    4. Multiple benign appearing nevi  - Provided reassurance    RTC in 6 months (dual visit Dr. Charlann Lange)         Lily Lovings, MD, FAAD  Melanoma and Skin Cancer Specialist  [facility]  [facility]   [address]  [place], [place] [address]  T [phone]  F [phone]

## 2019-07-05 ENCOUNTER — Telehealth: Payer: Self-pay | Admitting: Dermatology

## 2019-07-05 NOTE — Telephone Encounter (Signed)
Pt called to cancel her appt and reschedule to the following week.

## 2019-07-06 ENCOUNTER — Ambulatory Visit: Payer: No Typology Code available for payment source | Admitting: Dermatology

## 2019-07-06 ENCOUNTER — Ambulatory Visit: Payer: No Typology Code available for payment source | Admitting: Hematology & Oncology

## 2019-07-12 NOTE — Progress Notes (Signed)
[facility]    CC: Melanoma follow-up, Total body skin examination (TBSE), new tender lumps on arms    HPI:  [name] [name] is 64 y.o. female with a history of Stage 1B Left posterior shoulder. Patient noted a growing mole which became like a "pinkish/purpleish blister"  over  5 months, + bleeding. Saw her dermatologist Dr. Christella Noa who recommended excisional biopsy which was performed by Dr. Webb Silversmith on 04/06/14. Pathology showed 1.47 mm thick, non-ulcerated melanoma with 2 mitoses/mm2. Peripheral margin was close with melanoma in situ. She underwent wide local excision and sentinel lymph node biopsy on 05/19/14 which showed no residual melanoma at primary site and two negative left supraclavicular lymph nodes.    Current Outpatient Medications   Medication Sig Dispense Refill    allopurinol (ZYLOPRIM) 100 MG tablet allopurinol 100 mg tablet      aspirin 120 MG suppository aspirin   81 mg qd      aspirin EC 81 MG EC tablet Take 81 mg by mouth daily.      atorvastatin (LIPITOR) 10 MG tablet Take one tablet by mouth one time daily 30 tablet 0    atorvastatin (LIPITOR) 20 MG tablet atorvastatin 20 mg tablet      indomethacin (INDOCIN) 50 MG capsule       influenza quadrivalent-split vaccine, PF, (FLUARIX QUADRIVALENT) IM injection Fluarix Quad 2017-2018 (PF) 60 mcg (15 mcg x 4)/0.5 mL IM syringe      levothyroxine (SYNTHROID, LEVOTHROID) 25 MCG tablet levothyroxine 25 mcg tablet      loratadine (CLARITIN) 10 MG tablet Take 10 mg by mouth as needed.         Omega-3 Fatty Acids (FISH OIL) 1200 MG CAPS Take by mouth 2 (two) times daily.Essential Oil Omega Complex- 2tab qd          UNABLE TO FIND Copaiba (Colpaifera Essential Oil- 1tab qd      UNABLE TO FIND Micro Plex (Food Nutrient Complex) Twice daily.      UNABLE TO FIND Polyphenol Complex (deep blue) twice daily per pt.      Zoster Vac Recomb Adjuvanted (SHINGRIX) 50 MCG/0.5ML Recon Susp Shingrix (PF) 50 mcg/0.5 mL  intramuscular suspension, kit       No current facility-administered medications for this visit.      Allergies   Allergen Reactions    Ciprofloxacin Swelling and Rash    Cefaclor Hives     Past Medical History:   Diagnosis Date    Claustrophobia     Hyperlipidemia     Lipoma     base of posterior neck    Low back pain     Malignant neoplasm of skin     Melanoma on left shoulder back    Plantar fasciitis, right     Rash     Seasonal allergies      Past Surgical History:   Procedure Laterality Date    BIOPSY, SENTINEL NODE N/A 05/19/2014    Procedure: BIOPSY, SENTINEL NODE;  Surgeon: Lorenza Chick, MD;  Location: [facility] OR;  Service: General;  Laterality: N/A;   SENTINEL LYMPH NODE BIOSPY W/ NUC MED @ 8:00AM,     EXCISION, FOOT LESION Right 12/04/2016    Procedure: EXCISION, FOOT LESION;  Surgeon: Cathie Hoops, DPM;  Location: [facility] OR;  Service: Podiatry;  Laterality: Right;  RIGHT FOOT SOFT TISSUE MASS RESECTION    EXCISION, LIPOMA N/A 11/24/2014    Procedure: EXCISION, LIPOMA OF BASE OF NECK;  Surgeon: Lorenza Chick, MD;  Location: [facility];  Service: General;  Laterality: N/A;  BACK LIPOMA EXCISION    EXCISION, MASS Left 04/18/2015    Procedure: EXCISION, MASS;  Surgeon: Lorenza Chick, MD;  Location: [facility];  Service: General;  Laterality: Left;  LEFT THIGH MASS EXCISION    EXCISION, MELANOMA Bilateral 05/19/2014    Procedure: EXCISION, MELANOMA;  Surgeon: Lorenza Chick, MD;  [facility];  Service: General;  Laterality: Bilateral;  LEFT SHOULDER MELANOMA WIDE LOCAL EXCISION, RIGHT MID-BACK ATYPICAL NEVUS EXCISION      EYE SURGERY      LASIK  2003    SKIN BIOPSY       Family History   Problem Relation Age of Onset    Hypertension Father     Hyperlipidemia Father     Aneurysm Father     Heart disease Father         unsure of age    Asthma Brother     Hyperlipidemia Brother     ADD / ADHD Daughter     Asthma Daughter     Alcohol abuse  Maternal Uncle     Alcohol abuse Maternal Grandfather     Breast cancer Maternal Grandmother     Cancer Maternal Grandmother         breast    Thyroid disease Sister      Social History     Socioeconomic History    Marital status: Single     Spouse name: Not on file    Number of children: Not on file    Years of education: Not on file    Highest education level: Not on file   Occupational History    Not on file   Social Needs    Financial resource strain: Not on file    Food insecurity     Worry: Not on file     Inability: Not on file    Transportation needs     Medical: Not on file     Non-medical: Not on file   Tobacco Use    Smoking status: Former Smoker     Packs/day: 1.00     Years: 30.00     Pack years: 30.00     Quit date: 10/29/2002     Years since quitting: 16.7    Smokeless tobacco: Never Used   Substance and Sexual Activity    Alcohol use: Yes     Alcohol/week: 0.0 standard drinks     Comment: drinks wine nightly (2 glasses); very rare other alcohol    Drug use: No    Sexual activity: Yes     Partners: Male     Birth control/protection: None   Lifestyle    Physical activity     Days per week: Not on file     Minutes per session: Not on file    Stress: Not on file   Relationships    Social connections     Talks on phone: Not on file     Gets together: Not on file     Attends religious service: Not on file     Active member of club or organization: Not on file     Attends meetings of clubs or organizations: Not on file     Relationship status: Not on file    Intimate partner violence     Fear of current or ex partner: Not on file     Emotionally abused: Not  on file     Physically abused: Not on file     Forced sexual activity: Not on file   Other Topics Concern    Exposure to radiation? Not Asked    Exposure to other chemicals? Not Asked    Exposure to Asbestos? Not Asked   Social History Narrative    Not on file     REVIEW OF SYSTEMS  Const - no fever no chills   Resp- no  cough  Integ - see HPI    Objective:     Vitals reviewed   Constitutional: General appearance: NAD, conversant    HEENT: normocephalic, atraumatic. Conjunctiva, lids, lips, examined with no suspicious lesions    SKIN:A total body skin examination is completed including:   - palpation of scalp and inspection of hair of scalp, eyebrows, face, chest and extremities   - inspection of eccrine and apocrine glands.   - inspection and/or palpation of the skin and subcutaneous tissues with the following anatomic skin sites examined with the following findings:   1. Head including face - no lesions of concern  2. Neck  - no lesions of concern  3. Chest including breasts, and axillae - no lesions of concern  4. Abdomen -   a. Uniformly dark brown 4 x 4 mm minimally elevated on left abdomen superior and lateral to umbilicus (stable)  b. L mid flank 7 mm x 3 mm dark brown macule, reticular network   5. Genitalia, groin, buttocks - no lesions of concern  6. Back -   a. Posterior base of neck-healed scar (excised lipoma site)  b. Midline upper back- 5 x 5 mm light brown macule with darker brown area of pigment superiorly (measures 1 x 2 mm), few hairs coming out and breaking up pigment (stable compared to photos in Mirror, monitor)  7. Right upper extremity -   a. Right axilla- 3 x 2 mm brown macule (stable, continue to monitor)  b. Right lateral deltoid 7 cm lipoma soft tissue nodule   8. Left upper extremity - posterior shoulder 6 cm well healed scar (MM site 10/15) no pigmentation or nodularity; outer mid arm lipoma 2cm   9. Right lower extremity - no lesions of concern  10. Left lower extremity -   a. Left outer thigh - transverse scar 10cm (lipoma excision)- posterior aspect is a 4mm heart shaped dark brown macule (stable)     - Dermatoscopy was used to evaluate the nevi.   - General skin: multiple brown macules, size 2-58mm, approximately 50 in number, distributed primarily on the trunk. Extensive photodamage and  rhytids    LYMPHATIC:Examination of lymph node basins is completed, including the H and N, axilla and groin. There is no lymphadenopathy.    NECK: supple, no masses    CVS: no swelling, varicosities or edema    EXTREMITIES: inspection and palpation of digits and nails revealed no abnormalities    NEURO/PSYCH: alert and oriented, pleasant      Pathology reports   Date 04/06/14  Accession Number [id number])  Location Lt Post Shoulder  Dx MM   Clark Level IV   Thickness 1.39mm   Mitotic Rate 2 per mm   Ulceration Absent   Regression Absent       Assessment & Plan:   64 y.o. female with a history of Stage IB Left posterior shoulder 04/06/14 s/p WLE and negative SLNB. Presents for Total body skin examination (TBSE).    1.  Stage IB Left  posterior shoulder 03/2014  -No evidence of recurrence on exam    2. Multiple lipomas noted      3. Skin cancer screening  - total body skin examination performed today   - no  lesions concerning to warrant biopsy today      4. Multiple benign appearing nevi  - Provided reassurance    5. RTC 1 year     Aurora Mask MD

## 2019-07-13 ENCOUNTER — Ambulatory Visit: Payer: Medicaid Managed Care Other | Attending: Dermatology | Admitting: Dermatology

## 2019-07-13 ENCOUNTER — Encounter: Payer: Self-pay | Admitting: Hematology & Oncology

## 2019-07-13 ENCOUNTER — Ambulatory Visit (INDEPENDENT_AMBULATORY_CARE_PROVIDER_SITE_OTHER): Payer: Medicaid Managed Care Other | Admitting: Hematology & Oncology

## 2019-07-13 VITALS — BP 131/83 | HR 92 | Temp 97.9°F | Ht 62.0 in | Wt 123.9 lb

## 2019-07-13 VITALS — BP 131/83 | HR 92 | Temp 97.9°F | Resp 16 | Ht 62.0 in | Wt 123.9 lb

## 2019-07-13 DIAGNOSIS — Z1283 Encounter for screening for malignant neoplasm of skin: Secondary | ICD-10-CM | POA: Insufficient documentation

## 2019-07-13 DIAGNOSIS — Z85820 Personal history of malignant melanoma of skin: Secondary | ICD-10-CM | POA: Insufficient documentation

## 2019-07-13 DIAGNOSIS — C4362 Malignant melanoma of left upper limb, including shoulder: Secondary | ICD-10-CM | POA: Insufficient documentation

## 2019-07-13 NOTE — Progress Notes (Signed)
[facility] [place] [facility]    CC: Melanoma follow-up    HISTORY OF PRESENT ILLNESS  [name] [name] is 64 y.o. female who noted a growing mole in left shoulder which became like "blister" over 5 months. Also noted bleeding from the lesion. Saw her dermatologist Dr. Christella Noa who recommended excisional biopsy which was performed by Dr. Webb Silversmith on 04/06/14. Pathology showed 1.47 mm thick, non-ulcerated melanoma with 2 mitoses/mm2. Peripheral margin was close with melanoma in situ. Preop CXR and LDH were normal. She underwent wide local excision and sentinel lymph node biopsy on 05/19/14 which showed no residual melanoma at primary site and two negative left supraclavicular lymph nodes. She noticed new soft lump in left hip area in August 2016 for which ultrasound showed features most consistent with lipoma. She underwent excision by Dr.Hafner on 04/18/15 which confirmed it to be lipoma.    Last LDH was elevated (265) in March 2017. Repeat LDH in July 2017 was within normal limits (223). CXR 08/22/15 showed no evidence of metastatic disease.     CXR February 2019 showed no evidence of metastatic disease in the chest.    She presents for follow-up on surveillance for melanoma of left posterior shoulder. Denies enlarged or tender lymph nodes.Patient denies any rash/itching, fatigue, weight loss, headache, shortness of breath, nausea, vomiting, diarrhea or abdominal pain. Endorses onset of depression, started antidepressant with good effect (seeing PCP), will also see [facility] today. Denies suicidal ideation at time of visit today.    Current Outpatient Medications   Medication Sig Dispense Refill   . allopurinol (ZYLOPRIM) 100 MG tablet allopurinol 100 mg tablet     . aspirin 120 MG suppository aspirin   81 mg qd     . aspirin EC 81 MG EC tablet Take 81 mg by mouth daily.     Marland Kitchen atorvastatin (LIPITOR) 10 MG tablet Take one tablet by mouth one time daily 30 tablet 0   . indomethacin (INDOCIN) 50 MG capsule      .  influenza quadrivalent-split vaccine, PF, (FLUARIX QUADRIVALENT) IM injection Fluarix Quad 2017-2018 (PF) 60 mcg (15 mcg x 4)/0.5 mL IM syringe     . levothyroxine (SYNTHROID, LEVOTHROID) 25 MCG tablet levothyroxine 25 mcg tablet     . loratadine (CLARITIN) 10 MG tablet Take 10 mg by mouth as needed.        . Omega-3 Fatty Acids (FISH OIL) 1200 MG CAPS Take by mouth 2 (two) times daily.Essential Oil Omega Complex- 2tab qd         . UNABLE TO FIND Copaiba (Colpaifera Essential Oil- 1tab qd     . UNABLE TO FIND Micro Plex (Food Nutrient Complex) Twice daily.     Marland Kitchen UNABLE TO FIND Polyphenol Complex (deep blue) twice daily per pt.     . Zoster Vac Recomb Adjuvanted (SHINGRIX) 50 MCG/0.5ML Recon Susp Shingrix (PF) 50 mcg/0.5 mL intramuscular suspension, kit     . atorvastatin (LIPITOR) 20 MG tablet atorvastatin 20 mg tablet     . escitalopram (LEXAPRO) 10 MG tablet Take 1 tablet by mouth every 24 hours       No current facility-administered medications for this visit.      Allergies   Allergen Reactions   . Ciprofloxacin Swelling and Rash   . Cefaclor Hives     Past Medical History:   Diagnosis Date   . Claustrophobia    . Hyperlipidemia    . Lipoma     base of posterior neck   .  Low back pain    . Malignant neoplasm of skin     Melanoma on left shoulder back   . Plantar fasciitis, right    . Rash    . Seasonal allergies      Past Surgical History:   Procedure Laterality Date   . BIOPSY, SENTINEL NODE N/A 05/19/2014    Procedure: BIOPSY, SENTINEL NODE;  Surgeon: Lorenza Chick, MD;  Location: [facility] OR;  Service: General;  Laterality: N/A;   SENTINEL LYMPH NODE BIOSPY W/ NUC MED @ 8:00AM,    . EXCISION, FOOT LESION Right 12/04/2016    Procedure: EXCISION, FOOT LESION;  Surgeon: Cathie Hoops, DPM;  Location: [facility] OR;  Service: Podiatry;  Laterality: Right;  RIGHT FOOT SOFT TISSUE MASS RESECTION   . EXCISION, LIPOMA N/A 11/24/2014    Procedure: EXCISION, LIPOMA OF BASE OF NECK;  Surgeon: Lorenza Chick, MD;  Location: [facility];  Service: General;  Laterality: N/A;  BACK LIPOMA EXCISION   . EXCISION, MASS Left 04/18/2015    Procedure: EXCISION, MASS;  Surgeon: Lorenza Chick, MD;  Location: [facility] OR;  Service: General;  Laterality: Left;  LEFT THIGH MASS EXCISION   . EXCISION, MELANOMA Bilateral 05/19/2014    Procedure: EXCISION, MELANOMA;  Surgeon: Lorenza Chick, MD;  Location: [facility] OR;  Service: General;  Laterality: Bilateral;  LEFT SHOULDER MELANOMA WIDE LOCAL EXCISION, RIGHT MID-BACK ATYPICAL NEVUS EXCISION     . EYE SURGERY     . LASIK  2003   . SKIN BIOPSY       Family History   Problem Relation Age of Onset   . Hypertension Father    . Hyperlipidemia Father    . Aneurysm Father    . Heart disease Father         unsure of age   . Asthma Brother    . Hyperlipidemia Brother    . ADD / ADHD Daughter    . Asthma Daughter    . Alcohol abuse Maternal Uncle    . Alcohol abuse Maternal Grandfather    . Breast cancer Maternal Grandmother    . Cancer Maternal Grandmother         breast   . Thyroid disease Sister      Social History     Socioeconomic History   . Marital status: Single     Spouse name: Not on file   . Number of children: Not on file   . Years of education: Not on file   . Highest education level: Not on file   Occupational History   . Not on file   Social Needs   . Financial resource strain: Not on file   . Food insecurity     Worry: Not on file     Inability: Not on file   . Transportation needs     Medical: Not on file     Non-medical: Not on file   Tobacco Use   . Smoking status: Former Smoker     Packs/day: 1.00     Years: 30.00     Pack years: 30.00     Quit date: 10/29/2002     Years since quitting: 16.7   . Smokeless tobacco: Never Used   Substance and Sexual Activity   . Alcohol use: Yes     Alcohol/week: 0.0 standard drinks     Comment: drinks wine nightly (2 glasses); very rare other alcohol   . Drug use: No   .  Sexual activity: Yes     Partners: Male     Birth  control/protection: None   Lifestyle   . Physical activity     Days per week: Not on file     Minutes per session: Not on file   . Stress: Not on file   Relationships   . Social Wellsite geologist on phone: Not on file     Gets together: Not on file     Attends religious service: Not on file     Active member of club or organization: Not on file     Attends meetings of clubs or organizations: Not on file     Relationship status: Not on file   . Intimate partner violence     Fear of current or ex partner: Not on file     Emotionally abused: Not on file     Physically abused: Not on file     Forced sexual activity: Not on file   Other Topics Concern   . Exposure to radiation? Not Asked   . Exposure to other chemicals? Not Asked   . Exposure to Asbestos? Not Asked   Social History Narrative   . Not on file       REVIEW OF SYSTEMS  All other systems were reviewed and are negative except as previously noted in the HPI.       Objective:     Vitals:    07/13/19 1447   BP: 131/83   Pulse: 92   Resp: 16   Temp: 97.9 F (36.6 C)   SpO2: 96%       ECOG PS: 0  General: Vitals Reviewed. NAD  Eyes: Anicteric, normal conjunctiva  ENT: No sores in mouth  CV: RRR  RESP: CTA B  GI: NT/ND, no HSM  Neuro: AAO x 3  MS: no swelling in arms or legs  SKIN: 8 cm wide local excision scar in left shoulder without swelling, bleeding or tenderness. No evidence of satellite or in-transit lesion. 10cm excision scar in left lateral upper thigh  LYMPH: negative cervical suplarclavicular axillary inguinal.     Pathology reports   Date 04/06/14  Accession Number [id number])  Location Lt Post Shoulder  Dx MM   Clark Level IV   Thickness 1.64mm   Mitotic Rate 2 per mm   Ulceration Absent   Regression Absent       Assessment & Plan:   1. Melanoma: Stage 1B Left posterior shoulder, s/p WLE and negative SLNB December 2015     No clinical evidence of recurrence, therefore this will be patients last surveillance follow up.  Repeat CXR, if negative,  today this will be last planned CXR.    She saw Dr Sherlean Foot in August, seeing Dr Ruel Favors for skin exam today.     Orders Placed This Encounter   . XR Chest 2 Views     Dema Severin DNP, FNP-C  Doctor of Nursing Practice, Nurse Practitioner  Melanoma and Cutaneous Oncology  [facility]   [address]  [place], [place], [address]  [phone]  [phone]    I saw and examined patient with nurse practitioner. No clinical evidence of recurrence.   Will review CXR report when available. If it confirms to be normal, will discontinue medical oncology surveillance. Continue skin exams. Reviewed the distress screening test which prompted evaluation by [facility] staff today. No suicidal ideation.     [employer]  Rockne Menghini, MD   Medical Oncology  Director, Melanoma and Cutaneous Oncology [employer] [place]  P [phone]  [email or web address]

## 2019-07-14 ENCOUNTER — Encounter (INDEPENDENT_AMBULATORY_CARE_PROVIDER_SITE_OTHER): Payer: Self-pay

## 2019-07-14 NOTE — Progress Notes (Signed)
[facility])  DERM CONSULT    DATE: 07/13/2019    REASON FOR CONSULT: [name] [name] was consulted by Oncology Clinical Therapist (OCT) due to Distress Screening Score. OCT met with pt in an exam room. OCT introduced self and role at [facility] and [facility].     DISTRESS SCREENING: Pt completed Chronic Illness Distress Scale-Short Form (CIDS-SF). Pt scored a 9 out of 21, with a score of 0 on self-harm, indicating High Distress; 0 items were checked on the Problem Check List (PCL). Pt reports her distress is not directly related to her diagnosis. Pt admits her score was reflective of other psychosocial stressors such as financial concerns and relational problems. Pt identified her siblings as good sources of support. Pt states she has a history of depression and is currently taking psychotropic medication as prescribed by her PCP. Pt disclosed she enjoyed low-impact exercise but due to COVID-19 pandemic has not been able to attend classes. OCT offered [facility] virtual fitness programming as well as provided this writer's contact information.     CLINICAL IMPRESSION/ASSESSMENT: Pt was alert and oriented x4. Pt was dressed in a medical gown and presented with euthymic affect. Pt was receptive to support from this Clinical research associate.     PLAN: OCT remains available to support pt prn.         Forde Radon, MSW  Oncology Clinical Therapist  [facility]  [phone]

## 2019-10-03 ENCOUNTER — Other Ambulatory Visit: Payer: Self-pay | Admitting: Hematology & Oncology

## 2020-05-24 ENCOUNTER — Encounter: Payer: Self-pay | Admitting: Dermatology

## 2020-05-24 ENCOUNTER — Ambulatory Visit: Payer: Medicaid Managed Care Other | Attending: Dermatology | Admitting: Dermatology

## 2020-05-24 VITALS — BP 136/81 | HR 93 | Temp 98.7°F | Resp 15 | Ht 62.0 in | Wt 133.2 lb

## 2020-05-24 DIAGNOSIS — L309 Dermatitis, unspecified: Secondary | ICD-10-CM | POA: Insufficient documentation

## 2020-05-24 MED ORDER — TRIAMCINOLONE ACETONIDE 0.1 % EX OINT
TOPICAL_OINTMENT | CUTANEOUS | 1 refills | Status: DC
Start: 2020-05-24 — End: 2020-06-21

## 2020-05-24 NOTE — Progress Notes (Signed)
[place] [facility]    CC: Melanoma follow-up, Total body skin examination (TBSE), new tender lumps on arms    HPI:  [name] [name] is 64 y.o. female with a history of Stage 1B Left posterior shoulder, who presents for 2 spots on the back she noticed yesterday. They are not symptomatic. She's not sure exactly how long they've been there.             Melanoma history:  Stage 1B Left posterior shoulder. Patient noted a growing mole which became like a "pinkish/purpleish blister"  over  5 months, + bleeding. Saw her dermatologist Dr. Christella Noa who recommended excisional biopsy which was performed by Dr. Webb Silversmith on 04/06/14. Pathology showed 1.47 mm thick, non-ulcerated melanoma with 2 mitoses/mm2. Peripheral margin was close with melanoma in situ. She underwent wide local excision and sentinel lymph node biopsy on 05/19/14 which showed no residual melanoma at primary site and two negative left supraclavicular lymph nodes.    Current Outpatient Medications   Medication Sig Dispense Refill    allopurinol (ZYLOPRIM) 100 MG tablet allopurinol 100 mg tablet      aspirin 120 MG suppository aspirin   81 mg qd      aspirin EC 81 MG EC tablet Take 81 mg by mouth daily.      atorvastatin (LIPITOR) 10 MG tablet Take one tablet by mouth one time daily 30 tablet 0    atorvastatin (LIPITOR) 20 MG tablet atorvastatin 20 mg tablet      diclofenac (VOLTAREN) 50 MG EC tablet       escitalopram (LEXAPRO) 10 MG tablet Take 1 tablet by mouth every 24 hours      indomethacin (INDOCIN) 50 MG capsule       influenza quadrivalent-split vaccine, PF, (FLUARIX QUADRIVALENT) IM injection Fluarix Quad 2017-2018 (PF) 60 mcg (15 mcg x 4)/0.5 mL IM syringe      influenza quadrivalent-split vaccine, PF, (Fluarix Quadrivalent) IM injection Fluarix Quad 2020-2021 (PF) 60 mcg (15 mcg x 4)/0.5 mL IM syringe      levothyroxine (SYNTHROID, LEVOTHROID) 25 MCG tablet levothyroxine 25 mcg tablet      loratadine (CLARITIN) 10 MG  tablet Take 10 mg by mouth as needed.         Omega-3 Fatty Acids (FISH OIL) 1200 MG CAPS Take by mouth 2 (two) times daily.Essential Oil Omega Complex- 2tab qd          Progesterone (Prometrium) 200 MG Cap progesterone micronized 200 mg capsule   TAKE ONE CAPSULE BY MOUTH AT BEDTIME      UNABLE TO FIND Copaiba (Colpaifera Essential Oil- 1tab qd      UNABLE TO FIND Micro Plex (Food Nutrient Complex) Twice daily.      UNABLE TO FIND Polyphenol Complex (deep blue) twice daily per pt.      Zoster Vac Recomb Adjuvanted (SHINGRIX) 50 MCG/0.5ML Recon Susp Shingrix (PF) 50 mcg/0.5 mL intramuscular suspension, kit      triamcinolone (KENALOG) 0.1 % ointment Apply to rash on lower back twice daily until resolved (approx 2-4 weeks) 80 g 1     No current facility-administered medications for this visit.     Allergies   Allergen Reactions    Ciprofloxacin Swelling and Rash    Cefaclor Hives     Past Medical History:   Diagnosis Date    Claustrophobia     Hyperlipidemia     Lipoma     base of posterior neck    Low back  pain     Malignant neoplasm of skin     Melanoma on left shoulder back    Plantar fasciitis, right     Rash     Seasonal allergies      Past Surgical History:   Procedure Laterality Date    BIOPSY, SENTINEL NODE N/A 05/19/2014    Procedure: BIOPSY, SENTINEL NODE;  Surgeon: Lorenza Chick, MD;  Location: [facility] OR;  Service: General;  Laterality: N/A;   SENTINEL LYMPH NODE BIOSPY W/ NUC MED @ 8:00AM,     EXCISION, FOOT LESION Right 12/04/2016    Procedure: EXCISION, FOOT LESION;  Surgeon: Cathie Hoops, DPM;  Location: [facility] OR;  Service: Podiatry;  Laterality: Right;  RIGHT FOOT SOFT TISSUE MASS RESECTION    EXCISION, LIPOMA N/A 11/24/2014    Procedure: EXCISION, LIPOMA OF BASE OF NECK;  Surgeon: Lorenza Chick, MD;  Location: [facility];  Service: General;  Laterality: N/A;  BACK LIPOMA EXCISION    EXCISION, MASS Left 04/18/2015    Procedure: EXCISION, MASS;  Surgeon:  Lorenza Chick, MD;  Location: [facility] OR;  Service: General;  Laterality: Left;  LEFT THIGH MASS EXCISION    EXCISION, MELANOMA Bilateral 05/19/2014    Procedure: EXCISION, MELANOMA;  Surgeon: Lorenza Chick, MD;  Location: [facility] OR;  Service: General;  Laterality: Bilateral;  LEFT SHOULDER MELANOMA WIDE LOCAL EXCISION, RIGHT MID-BACK ATYPICAL NEVUS EXCISION      EYE SURGERY      LASIK  2003    SKIN BIOPSY       Family History   Problem Relation Age of Onset    Hypertension Father     Hyperlipidemia Father     Aneurysm Father     Heart disease Father         unsure of age    Asthma Brother     Hyperlipidemia Brother     ADD / ADHD Daughter     Asthma Daughter     Alcohol abuse Maternal Uncle     Alcohol abuse Maternal Grandfather     Breast cancer Maternal Grandmother     Cancer Maternal Grandmother         breast    Thyroid disease Sister      Social History     Socioeconomic History    Marital status: Single   Tobacco Use    Smoking status: Former Smoker     Packs/day: 1.00     Years: 30.00     Pack years: 30.00     Quit date: 10/29/2002     Years since quitting: 17.5    Smokeless tobacco: Never Used   Substance and Sexual Activity    Alcohol use: Yes     Alcohol/week: 0.0 standard drinks     Comment: drinks wine nightly (2 glasses); very rare other alcohol    Drug use: No    Sexual activity: Yes     Partners: Male     Birth control/protection: None     REVIEW OF SYSTEMS  Const - no fever no chills   Resp- no cough  Integ - see HPI    Objective:     Vitals reviewed   Constitutional: General appearance: NAD, conversant    HEENT: normocephalic, atraumatic. Conjunctiva, lids, lips, examined with no suspicious lesions    SKIN: mid back, a total of 4 round shaped scaly red raised papules and plaques.             Pathology  reports   Date 04/06/14  Accession Number [id number])  Location Lt Post Shoulder  Dx MM   Clark Level IV   Thickness 1.82mm   Mitotic Rate 2 per mm   Ulceration  Absent   Regression Absent       Assessment & Plan:   64 y.o. female with a history of Stage IB Left posterior shoulder 04/06/14 s/p WLE and negative SLNB. Presents for Total body skin examination (TBSE).    1.  Stage IB Left posterior shoulder 03/2014  -continue annual TBSE    2. Dermatitis:  - unclear trigger, but no evidence of any malignancy, so reassurance from that perspective provided today  -Recommend use of triamcinolone ointment twice daily to affected area for the next 2 to 4 weeks, stopping when rash resolved  Use of topical steroids discussed. Side effects of topical steroids discussed, such as skin thinning, stretch marks, skin lightening.  Recommended only using on affected areas and only when skin is red/itchy to avoid SEs.   -We will see her back in 1 month to ensure this resolves if it still present then we will consider biopsy at that point.    RTC 43m to check for resolution of rash  RTC Feb as scheduled for TBSE    Georgiann Cocker, MD    [facility]  [facility] Melanoma and Skin [facility]  [address]  T [phone]   F [phone]  [email or web address]

## 2020-06-20 ENCOUNTER — Telehealth: Payer: Self-pay | Admitting: Dermatology

## 2020-06-20 NOTE — Telephone Encounter (Signed)
Pt confirmed appt, SCREENED for covid and its aware of visitors policy, aware to arrive 15 mins early. Face Mask required. (gt) 1/12

## 2020-06-21 ENCOUNTER — Encounter: Payer: Self-pay | Admitting: Dermatology

## 2020-06-21 ENCOUNTER — Ambulatory Visit: Payer: Medicaid Managed Care Other | Attending: Dermatology | Admitting: Dermatology

## 2020-06-21 VITALS — BP 135/70 | HR 83 | Temp 97.9°F | Resp 17 | Ht 62.0 in | Wt 131.2 lb

## 2020-06-21 DIAGNOSIS — L309 Dermatitis, unspecified: Secondary | ICD-10-CM | POA: Insufficient documentation

## 2020-06-21 MED ORDER — TRIAMCINOLONE ACETONIDE 0.1 % EX CREA
TOPICAL_CREAM | Freq: Two times a day (BID) | CUTANEOUS | 1 refills | Status: DC | PRN
Start: 2020-06-21 — End: 2023-12-23

## 2020-06-21 NOTE — Progress Notes (Signed)
[facility]    CC: rash on back    HPI:  [name] [name] is 65 y.o. female with a history of Stage 1B Left posterior shoulder, who presents for followup of eczematous dermatitis on the back she noticed 1 month ago. She was rxed triamcinolone 0.1% ointment (would have preferred cream), used it for a short course and thinks the spots have resolved.           Melanoma history:  Stage 1B Left posterior shoulder. Patient noted a growing mole which became like a "pinkish/purpleish blister"  over  5 months, + bleeding. Saw her dermatologist Dr. Christella Noa who recommended excisional biopsy which was performed by Dr. Webb Silversmith on 04/06/14. Pathology showed 1.47 mm thick, non-ulcerated melanoma with 2 mitoses/mm2. Peripheral margin was close with melanoma in situ. She underwent wide local excision and sentinel lymph node biopsy on 05/19/14 which showed no residual melanoma at primary site and two negative left supraclavicular lymph nodes.    Current Outpatient Medications   Medication Sig Dispense Refill   . allopurinol (ZYLOPRIM) 100 MG tablet allopurinol 100 mg tablet     . aspirin 120 MG suppository aspirin   81 mg qd     . aspirin EC 81 MG EC tablet Take 81 mg by mouth daily.     Marland Kitchen atorvastatin (LIPITOR) 10 MG tablet Take one tablet by mouth one time daily 30 tablet 0   . atorvastatin (LIPITOR) 20 MG tablet atorvastatin 20 mg tablet     . diclofenac (VOLTAREN) 50 MG EC tablet      . escitalopram (LEXAPRO) 10 MG tablet Take 1 tablet by mouth every 24 hours     . indomethacin (INDOCIN) 50 MG capsule      . influenza quadrivalent-split vaccine, PF, (FLUARIX QUADRIVALENT) IM injection Fluarix Quad 2017-2018 (PF) 60 mcg (15 mcg x 4)/0.5 mL IM syringe     . influenza quadrivalent-split vaccine, PF, (Fluarix Quadrivalent) IM injection Fluarix Quad 2020-2021 (PF) 60 mcg (15 mcg x 4)/0.5 mL IM syringe     . levothyroxine (SYNTHROID, LEVOTHROID) 25 MCG tablet levothyroxine 25 mcg tablet     . loratadine  (CLARITIN) 10 MG tablet Take 10 mg by mouth as needed.        . Omega-3 Fatty Acids (FISH OIL) 1200 MG CAPS Take by mouth 2 (two) times daily.Essential Oil Omega Complex- 2tab qd         . Progesterone (Prometrium) 200 MG Cap progesterone micronized 200 mg capsule   TAKE ONE CAPSULE BY MOUTH AT BEDTIME     . UNABLE TO FIND Copaiba (Colpaifera Essential Oil- 1tab qd     . UNABLE TO FIND Micro Plex (Food Nutrient Complex) Twice daily.     Marland Kitchen UNABLE TO FIND Polyphenol Complex (deep blue) twice daily per pt.     . Zoster Vac Recomb Adjuvanted (SHINGRIX) 50 MCG/0.5ML Recon Susp Shingrix (PF) 50 mcg/0.5 mL intramuscular suspension, kit     . triamcinolone (KENALOG) 0.1 % cream Apply topically 2 (two) times daily as needed (itchy rash) For up to 2 weeks at a time 45 g 1     No current facility-administered medications for this visit.     Allergies   Allergen Reactions   . Ciprofloxacin Swelling and Rash   . Cefaclor Hives     Past Medical History:   Diagnosis Date   . Claustrophobia    . Hyperlipidemia    . Lipoma     base of  posterior neck   . Low back pain    . Malignant neoplasm of skin     Melanoma on left shoulder back   . Plantar fasciitis, right    . Rash    . Seasonal allergies      Past Surgical History:   Procedure Laterality Date   . BIOPSY, SENTINEL NODE N/A 05/19/2014    Procedure: BIOPSY, SENTINEL NODE;  Surgeon: Lorenza Chick, MD;  Location: [facility] OR;  Service: General;  Laterality: N/A;   SENTINEL LYMPH NODE BIOSPY W/ NUC MED @ 8:00AM,    . EXCISION, FOOT LESION Right 12/04/2016    Procedure: EXCISION, FOOT LESION;  Surgeon: Cathie Hoops, DPM;  Location: [facility] OR;  Service: Podiatry;  Laterality: Right;  RIGHT FOOT SOFT TISSUE MASS RESECTION   . EXCISION, LIPOMA N/A 11/24/2014    Procedure: EXCISION, LIPOMA OF BASE OF NECK;  Surgeon: Lorenza Chick, MD;  Location: [facility];  Service: General;  Laterality: N/A;  BACK LIPOMA EXCISION   . EXCISION, MASS Left 04/18/2015     Procedure: EXCISION, MASS;  Surgeon: Lorenza Chick, MD;  Location: [facility] OR;  Service: General;  Laterality: Left;  LEFT THIGH MASS EXCISION   . EXCISION, MELANOMA Bilateral 05/19/2014    Procedure: EXCISION, MELANOMA;  Surgeon: Lorenza Chick, MD;  Location: [facility] OR;  Service: General;  Laterality: Bilateral;  LEFT SHOULDER MELANOMA WIDE LOCAL EXCISION, RIGHT MID-BACK ATYPICAL NEVUS EXCISION     . EYE SURGERY     . LASIK  2003   . SKIN BIOPSY       Family History   Problem Relation Age of Onset   . Hypertension Father    . Hyperlipidemia Father    . Aneurysm Father    . Heart disease Father         unsure of age   . Asthma Brother    . Hyperlipidemia Brother    . ADD / ADHD Daughter    . Asthma Daughter    . Alcohol abuse Maternal Uncle    . Alcohol abuse Maternal Grandfather    . Breast cancer Maternal Grandmother    . Cancer Maternal Grandmother         breast   . Thyroid disease Sister      Social History     Socioeconomic History   . Marital status: Single   Tobacco Use   . Smoking status: Former Smoker     Packs/day: 1.00     Years: 30.00     Pack years: 30.00     Quit date: 10/29/2002     Years since quitting: 17.6   . Smokeless tobacco: Never Used   Substance and Sexual Activity   . Alcohol use: Yes     Alcohol/week: 0.0 standard drinks     Comment: drinks wine nightly (2 glasses); very rare other alcohol   . Drug use: No   . Sexual activity: Yes     Partners: Male     Birth control/protection: None     REVIEW OF SYSTEMS  Const - no fever no chills   Resp- no cough  Integ - see HPI    Objective:     Vitals reviewed   Constitutional: General appearance: NAD, conversant         SKIN: mid back, resolution of eczematous dermatitic plaques noted in photo from one month ago:            Pathology reports  Date 04/06/14  Accession Number [id number])  Location Lt Post Shoulder  Dx MM   Clark Level IV   Thickness 1.18mm   Mitotic Rate 2 per mm   Ulceration Absent   Regression Absent       Assessment &  Plan:   65 y.o. female with a history of Stage IB Left posterior shoulder 04/06/14 s/p WLE and negative SLNB. Presents for followup of rash    1.  Stage IB Left posterior shoulder 03/2014  -continue annual TBSE, due in 1 m with Dr. Sherlean Foot    2. Dermatitis, mid back:  - unclear trigger, but no evidence of any malignancy and has resolved completely.   - patient prefers creams to ointments, so rx'ed tac cream to use BID PRN for up to 2 weeks if rash recurs.  Use of topical steroids discussed. Side effects of topical steroids discussed, such as skin thinning, stretch marks, skin lightening.  Recommended only using on affected areas and only when skin is red/itchy to avoid SEs.     RTC Feb as scheduled for TBSE    Georgiann Cocker, MD    [facility]  [facility]  [address]  T [phone]  F [phone]  [email or web address]

## 2020-07-13 ENCOUNTER — Telehealth: Payer: Self-pay | Admitting: Dermatology

## 2020-07-13 NOTE — Telephone Encounter (Signed)
Confirmed appointment for 07/16/2020: Advised patient to arrive 20-30min prior to appointment time to complete any paperwork. Completed prescreening. Advised patient of visitor policy and mask requirement.

## 2020-07-16 ENCOUNTER — Encounter: Payer: Self-pay | Admitting: Dermatology

## 2020-07-16 ENCOUNTER — Ambulatory Visit: Payer: Self-pay

## 2020-07-16 ENCOUNTER — Ambulatory Visit: Payer: Medicaid Managed Care Other | Attending: Dermatology | Admitting: Dermatology

## 2020-07-16 VITALS — BP 120/74 | HR 79 | Temp 98.1°F | Resp 16 | Ht 62.0 in | Wt 133.1 lb

## 2020-07-16 DIAGNOSIS — D485 Neoplasm of uncertain behavior of skin: Secondary | ICD-10-CM

## 2020-07-16 DIAGNOSIS — D2371 Other benign neoplasm of skin of right lower limb, including hip: Secondary | ICD-10-CM | POA: Insufficient documentation

## 2020-07-16 DIAGNOSIS — Z1283 Encounter for screening for malignant neoplasm of skin: Secondary | ICD-10-CM

## 2020-07-16 DIAGNOSIS — C4401 Basal cell carcinoma of skin of lip: Secondary | ICD-10-CM | POA: Insufficient documentation

## 2020-07-16 DIAGNOSIS — L814 Other melanin hyperpigmentation: Secondary | ICD-10-CM | POA: Insufficient documentation

## 2020-07-16 DIAGNOSIS — Z85820 Personal history of malignant melanoma of skin: Secondary | ICD-10-CM

## 2020-07-16 DIAGNOSIS — D229 Melanocytic nevi, unspecified: Secondary | ICD-10-CM

## 2020-07-16 LAB — SURGICAL PATHOLOGY EXAM

## 2020-07-16 NOTE — Progress Notes (Signed)
[facility]    CC: Melanoma follow-up, Total body skin examination (TBSE)    HPI:    Interval-    Presents today for Total body skin examination (TBSE). Today patient states concern over new lesion on right upper lip. Otherwise, denies any new, changing, itching, peeling, bleeding, painful lesions. Denies enlarged or tender lymph nodes. Feels well overall.    Last TBSE was with Dr.Venna in February 2021, at which time no suspicious lesions were identified warranting biopsy.     Also saw Dr. Cay Schillings in December 2021 and January 2022 for dermatitis on back, treated with topical triamcinolone.    Detailed past-  [name] [name] is 65 y.o. female with a history of Stage 1B Left posterior shoulder. Patient noted a growing mole which became like a "pinkish/purpleish blister"  over  5 months, + bleeding. Saw her dermatologist Dr. Christella Noa who recommended excisional biopsy which was performed by Dr. Webb Silversmith on 04/06/14. Pathology showed 1.47 mm thick, non-ulcerated melanoma with 2 mitoses/mm2. Peripheral margin was close with melanoma in situ. She underwent wide local excision and sentinel lymph node biopsy on 05/19/14 which showed no residual melanoma at primary site and two negative left supraclavicular lymph nodes.      Current Outpatient Medications   Medication Sig Dispense Refill   . allopurinol (ZYLOPRIM) 100 MG tablet allopurinol 100 mg tablet     . aspirin 120 MG suppository aspirin   81 mg qd     . aspirin EC 81 MG EC tablet Take 81 mg by mouth daily.     Marland Kitchen atorvastatin (LIPITOR) 10 MG tablet Take one tablet by mouth one time daily 30 tablet 0   . atorvastatin (LIPITOR) 20 MG tablet atorvastatin 20 mg tablet     . diclofenac (VOLTAREN) 50 MG EC tablet      . escitalopram (LEXAPRO) 10 MG tablet Take 1 tablet by mouth every 24 hours     . indomethacin (INDOCIN) 50 MG capsule      . influenza quadrivalent-split vaccine, PF, (FLUARIX QUADRIVALENT) IM injection Fluarix Quad 2017-2018  (PF) 60 mcg (15 mcg x 4)/0.5 mL IM syringe     . influenza quadrivalent-split vaccine, PF, (Fluarix Quadrivalent) IM injection Fluarix Quad 2020-2021 (PF) 60 mcg (15 mcg x 4)/0.5 mL IM syringe     . levothyroxine (SYNTHROID, LEVOTHROID) 25 MCG tablet levothyroxine 25 mcg tablet     . loratadine (CLARITIN) 10 MG tablet Take 10 mg by mouth as needed.        . Omega-3 Fatty Acids (FISH OIL) 1200 MG CAPS Take by mouth 2 (two) times daily.Essential Oil Omega Complex- 2tab qd         . Progesterone (Prometrium) 200 MG Cap progesterone micronized 200 mg capsule   TAKE ONE CAPSULE BY MOUTH AT BEDTIME     . triamcinolone (KENALOG) 0.1 % cream Apply topically 2 (two) times daily as needed (itchy rash) For up to 2 weeks at a time 45 g 1   . UNABLE TO FIND Copaiba (Colpaifera Essential Oil- 1tab qd     . UNABLE TO FIND Micro Plex (Food Nutrient Complex) Twice daily.     Marland Kitchen UNABLE TO FIND Polyphenol Complex (deep blue) twice daily per pt.     . Zoster Vac Recomb Adjuvanted (SHINGRIX) 50 MCG/0.5ML Recon Susp Shingrix (PF) 50 mcg/0.5 mL intramuscular suspension, kit       No current facility-administered medications for this visit.     Allergies   Allergen  Reactions   . Ciprofloxacin Swelling and Rash   . Cefaclor Hives     Past Medical History:   Diagnosis Date   . Claustrophobia    . Hyperlipidemia    . Lipoma     base of posterior neck   . Low back pain    . Malignant neoplasm of skin     Melanoma on left shoulder back   . Plantar fasciitis, right    . Rash    . Seasonal allergies      Past Surgical History:   Procedure Laterality Date   . BIOPSY, SENTINEL NODE N/A 05/19/2014    Procedure: BIOPSY, SENTINEL NODE;  Surgeon: Lorenza Chick, MD;  Location: [facility] OR;  Service: General;  Laterality: N/A;   SENTINEL LYMPH NODE BIOSPY W/ NUC MED @ 8:00AM,    . EXCISION, FOOT LESION Right 12/04/2016    Procedure: EXCISION, FOOT LESION;  Surgeon: Cathie Hoops, DPM;  Location: [facility] OR;  Service: Podiatry;   Laterality: Right;  RIGHT FOOT SOFT TISSUE MASS RESECTION   . EXCISION, LIPOMA N/A 11/24/2014    Procedure: EXCISION, LIPOMA OF BASE OF NECK;  Surgeon: Lorenza Chick, MD;  Location: [facility];  Service: General;  Laterality: N/A;  BACK LIPOMA EXCISION   . EXCISION, MASS Left 04/18/2015    Procedure: EXCISION, MASS;  Surgeon: Lorenza Chick, MD;  Location: [facility] OR;  Service: General;  Laterality: Left;  LEFT THIGH MASS EXCISION   . EXCISION, MELANOMA Bilateral 05/19/2014    Procedure: EXCISION, MELANOMA;  Surgeon: Lorenza Chick, MD;  Location: [facility] OR;  Service: General;  Laterality: Bilateral;  LEFT SHOULDER MELANOMA WIDE LOCAL EXCISION, RIGHT MID-BACK ATYPICAL NEVUS EXCISION     . EYE SURGERY     . LASIK  2003   . SKIN BIOPSY       Family History   Problem Relation Age of Onset   . Hypertension Father    . Hyperlipidemia Father    . Aneurysm Father    . Heart disease Father         unsure of age   . Asthma Brother    . Hyperlipidemia Brother    . ADD / ADHD Daughter    . Asthma Daughter    . Alcohol abuse Maternal Uncle    . Alcohol abuse Maternal Grandfather    . Breast cancer Maternal Grandmother    . Cancer Maternal Grandmother         breast   . Thyroid disease Sister      Social History     Socioeconomic History   . Marital status: Single   Tobacco Use   . Smoking status: Former Smoker     Packs/day: 1.00     Years: 30.00     Pack years: 30.00     Quit date: 10/29/2002     Years since quitting: 17.7   . Smokeless tobacco: Never Used   Substance and Sexual Activity   . Alcohol use: Yes     Alcohol/week: 0.0 standard drinks     Comment: drinks wine nightly (2 glasses); very rare other alcohol   . Drug use: No   . Sexual activity: Yes     Partners: Male     Birth control/protection: None     REVIEW OF SYSTEMS  Const - no fever no chills   Resp- no cough   Neuro - no headache   Abd - no pain   Integ -  see HPI    All other systems reviewed and negative      Objective:     Vitals reviewed    Constitutional: General appearance: NAD, conversant    HEENT: normocephalic, atraumatic. Conjunctiva, lids, lips, examined with no suspicious lesions    SKIN:A total body skin examination is completed including:   - palpation of scalp and inspection of hair of scalp, eyebrows, face, chest and extremities   - inspection of eccrine and apocrine glands.   - inspection and/or palpation of the skin and subcutaneous tissues with the following anatomic skin sites examined with the following findings:   1. Head including face -   a. Right upper lip- 5 x 3 mm skin colored papule with arborizing vessels, patient's lesion of concern (skin lesion removal)  2. Neck  - no lesions of concern  3. Chest including breasts, and axillae - no lesions of concern  4. Abdomen -   a. Left abdomen superior and lateral to umbilicus- Uniformly dark brown papule, measuring 4 x 4 mm, minimally elevated   b. L mid flank- 7 mm x 3 mm dark brown macule, reticular network   5. Genitalia, groin, buttocks -   a. Right lateral buttock- 8 x 4 mm tan macule  b. Right upper buttock- 3 x 3 mm dark brown macule  6. Back -   a. Posterior base of neck-healed scar (excised lipoma site)  b. Midline upper back- 5 x 5 mm light brown macule with darker brown area of pigment superiorly (measures 1 x 2 mm), few hairs coming out and breaking up pigment (stable compared to photos in Mirror, monitor)  7. Right upper extremity -   a. Right axilla- 3 x 2 mm brown macule (stable, continue to monitor)  b. Right lateral deltoid 7 cm lipoma soft tissue nodule   8. Left upper extremity - posterior shoulder 6 cm well healed scar (MM site 10/15) no pigmentation or nodularity; outer mid arm lipoma 2cm   9. Right lower extremity -   a. Right lateral upper thigh- 5 x 3 mm dark brown macule, inferior 1/2 darker (skin lesion removal)  b. Right upper outer thigh- 8 x 8 mm tan papule  10. Left lower extremity -   a. Left outer thigh - transverse scar 10cm (lipoma excision)-  posterior aspect is a 4mm heart shaped dark brown macule (stable)     - Dermatoscopy was used to evaluate the nevi.   - General skin: multiple brown macules, size 2-31mm, approximately 50 in number, distributed primarily on the trunk. Extensive photodamage and rhytids    LYMPHATIC:Examination of lymph node basins is completed, including the H and N, axilla and groin. There is no lymphadenopathy.    NECK: supple, no masses    CVS: no swelling, varicosities or edema    EXTREMITIES: inspection and palpation of digits and nails revealed no abnormalities    NEURO/PSYCH: alert and oriented, pleasant      Pathology reports   Date 04/06/14  Accession Number ([id number])  Location Lt Post Shoulder  Dx MM   Clark Level IV   Thickness 1.69mm   Mitotic Rate 2 per mm   Ulceration Absent   Regression Absent     Procedure: Skin Lesion Removal (SLR)   Location: Right upper lip, size 5mm   After discussing the benefits and risks of a SLR including pain, infection, bleeding and/ or scarring, informed consent was obtained to perform a SLR. A time out was taken to  confirm the patient's name, date of birth and anatomic site of the lesion. The area was then prepped with alcohol in the usual sterile fashion and anesthetized with approximately 0.5 cc of 1% lidocaine with epinephrine. SLR performed with dermablade. Tissue is removed extending at least into the mid-dermis and around the entirety of the lesion. Hemostasis was controlled with aluminum chloride solution/electrodesiccation. The site was dressed with hydrated petrolatum and a band-aide. The patient tolerated the procedure well and was provided verbal and written wound care instructions. The patient will be notified of the results in 1-2 weeks    Procedure: Skin Lesion Removal (SLR)   Location: Right lateral upper thigh, size 5mm   After discussing the benefits and risks of a SLR including pain, infection, bleeding and/ or scarring, informed consent was obtained to perform a SLR.  A time out was taken to confirm the patient's name, date of birth and anatomic site of the lesion. The area was then prepped with alcohol in the usual sterile fashion and anesthetized with approximately 0.5 cc of 1% lidocaine with epinephrine. SLR performed with dermablade. Tissue is removed extending at least into the mid-dermis and around the entirety of the lesion. Hemostasis was controlled with aluminum chloride solution/electrodesiccation. The site was dressed with hydrated petrolatum and a band-aide. The patient tolerated the procedure well and was provided verbal and written wound care instructions. The patient will be notified of the results in 1-2 weeks      Assessment & Plan:   65 y.o. female with a history of Stage IB Left posterior shoulder 04/06/14 s/p WLE and negative SLNB. Presents for Total body skin examination (TBSE).    1.  Stage IB Left posterior shoulder 03/2014  -No evidence of recurrence on exam  -Recommend annual TBSE for life    2. Neoplasm of uncertain behavior of skin  -Right upper lip, skin lesion removal, r/o BCC  -Right lateral upper thigh, skin lesion removal, r/o DN vs. MM    3. Multiple nevi  -Previous photos of back used to compare nevi- stable  -No other lesions warranting biopsy    3. Skin cancer screening  - total body skin examination performed today    - A few lesions have been documented as above and will be followed   - Sun protection reviewed   - ABCDEs of melanoma reviewed   - Monthly self-skin examination was advised and the patient was instructed to return to clinic prior to scheduled visit if any suspicious or worrisome findings are discovered    4. RTC 1 year for TBSE    Jeannette Corpus, DNP, FNP-BC  Nurse Practitioner, [facility]  [facility]  [facility]    I personally reviewed the patients history, pathology when relevant, and completed an independent skin examination. I agree with all components of this written  note. I have made addendums where/if needed.    Lily Lovings, MD, FAAD  Melanoma and Skin Cancer Specialist  [facility]  [facility]   [address]  [place], [place] [address]  T [phone]  F [phone]           30 mins of time was spent in total preparing to see the patient, reviewing prior notes, performing the full body skin and lymph node examination, using dermoscopy to evaluate skin lesions, ordering medications/tests/procedures (such as skin biopsies or cryotherapy), counseling and educating the patient on sun protection and the importance of  self-skin examination,  documenting the clinical information and coordinating future care and follow up.

## 2020-07-18 LAB — LAB USE ONLY - HISTORICAL SURGICAL PATHOLOGY

## 2020-07-20 ENCOUNTER — Telehealth: Payer: Self-pay

## 2020-07-20 NOTE — Telephone Encounter (Signed)
The results of the biopsy were reviewed with the patient.    Specimen A: The result is malignant: BCC. The next step is refer to mohs. and Specimen B: The result is atypical mole: mild atypia, margins are involved. The next step is no further treatment. Per Dr Sherlean Foot,  - R upper lip - BCC, needs MOHS, refer to the [facility] and fax pathology report to their office, she has seen Dr. Webb Silversmith in the past for her melanoma hx - R lateral upper thigh - mild DN, no further tx needed. Patient said that she has medicaid and will have to look for surgeon who does mohs. She will call us with surgeon's name.        Results for orders placed or performed in visit on 07/16/20 (from the past 720 hour(s))   Surgical Pathology    Narrative    [facility]  [id number]           DINITA, MIGLIACCIO     LABORATORY                                  MED [facility] #:  [id number]                Procedure Date & Time: 07/16/2020, 15:14     SURGICAL PATHOLOGY REPORT        DIAGNOSIS:      A. RIGHT UPPER LIP SKIN, BIOPSY: BASAL CELL CARCINOMA, SUPERFICIAL        AND NODULAR TYPE - ONE PERIPHERAL BIOPSY EDGE INVOLVED - DEEP        BIOPSY EDGE BROADLY INVOLVED          B. 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                CLINICAL INFORMATION:    Rule out BCC; rule out DN vs MM        SPECIMEN:    A.  SKIN, BIOPSY, RIGHT UPPER LIP    B.  SKIN, BIOPSY, RIGHT LATERAL UPPER THIGH        GROSS DESCRIPTION:    The specimen is received in two parts each labeled [id number];    Ennen, Brihanna and medical record number [id number].        A.  Received in formalin labeled "right upper lip" is a 0.5 x 0.2 x 0.1    cm tan-white skin shave which is inked black, bisected and entirely     submitted in one cassette.        B.  Received in formalin labeled "right lateral upper thigh" is a 0.8 x    0.6 x 0.1 cm tan-white skin shave which is inked black, bisected and    entirely submitted in one cassette.        LB/srr    07/17/20            When stains are performed, all  control slides are reviewed and found to    stain appropriately.  Some stains and controls used in arriving at the    diagnosis (if any) were prepared at [facility] and/or    [facility] using reagents that have not been cleared or approved    by the U.S. Food and Drug Administration.  These were developed and    their performance characteristics determined by [facility] and/or [facility]. Their use does not require FDA    approval.        [END OF REPORT]            Printed/Date  07/18/2020  Page 1 of 1          Sankey, [place]                                                 MED REC #:  [id number]

## 2020-07-31 NOTE — Progress Notes (Signed)
Patient informed us that she said that she found a [employer], [employer] who accepts her insurance. She mentioned Dr Boneta Lucks Aragon-Ching. 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 Called Dr Riley Kill Chen's office and was informed that they do not take [facility] insurance. Will call patient.

## 2020-07-31 NOTE — Progress Notes (Signed)
Surgical pathology report was faxed to Dr Angelia Mould plastic surgeon at [phone]. No sure if she does surgical procedure. She does take patient's insurance.

## 2020-10-15 ENCOUNTER — Ambulatory Visit (INDEPENDENT_AMBULATORY_CARE_PROVIDER_SITE_OTHER): Payer: Medicaid Managed Care Other | Admitting: Nurse Practitioner

## 2020-10-15 ENCOUNTER — Encounter (INDEPENDENT_AMBULATORY_CARE_PROVIDER_SITE_OTHER): Payer: Self-pay | Admitting: Nurse Practitioner

## 2020-10-15 VITALS — BP 147/82 | HR 75 | Temp 95.5°F | Wt 127.0 lb

## 2020-10-15 DIAGNOSIS — Z1211 Encounter for screening for malignant neoplasm of colon: Secondary | ICD-10-CM

## 2020-10-15 DIAGNOSIS — Z0000 Encounter for general adult medical examination without abnormal findings: Secondary | ICD-10-CM

## 2020-10-15 DIAGNOSIS — M1A0790 Idiopathic chronic gout, unspecified ankle and foot, without tophus (tophi): Secondary | ICD-10-CM | POA: Insufficient documentation

## 2020-10-15 DIAGNOSIS — Z9109 Other allergy status, other than to drugs and biological substances: Secondary | ICD-10-CM | POA: Insufficient documentation

## 2020-10-15 DIAGNOSIS — R4589 Other symptoms and signs involving emotional state: Secondary | ICD-10-CM

## 2020-10-15 DIAGNOSIS — E782 Mixed hyperlipidemia: Secondary | ICD-10-CM

## 2020-10-15 DIAGNOSIS — E039 Hypothyroidism, unspecified: Secondary | ICD-10-CM

## 2020-10-15 DIAGNOSIS — R2242 Localized swelling, mass and lump, left lower limb: Secondary | ICD-10-CM

## 2020-10-15 DIAGNOSIS — M79672 Pain in left foot: Secondary | ICD-10-CM

## 2020-10-15 MED ORDER — ATORVASTATIN CALCIUM 10 MG PO TABS
10.0000 mg | ORAL_TABLET | Freq: Every day | ORAL | 0 refills | Status: DC
Start: 2020-10-15 — End: 2020-12-11

## 2020-10-15 MED ORDER — ESCITALOPRAM OXALATE 10 MG PO TABS
10.0000 mg | ORAL_TABLET | ORAL | 0 refills | Status: AC
Start: 2020-10-15 — End: ?

## 2020-10-15 MED ORDER — LEVOTHYROXINE SODIUM 25 MCG PO TABS
ORAL_TABLET | ORAL | 0 refills | Status: DC
Start: 2020-10-15 — End: 2020-12-11

## 2020-10-15 MED ORDER — ALLOPURINOL 100 MG PO TABS
ORAL_TABLET | ORAL | 0 refills | Status: AC
Start: 2020-10-15 — End: ?

## 2020-10-15 MED ORDER — LORATADINE 10 MG PO TABS
10.0000 mg | ORAL_TABLET | ORAL | 0 refills | Status: AC | PRN
Start: 2020-10-15 — End: ?

## 2020-10-15 MED ORDER — DICLOFENAC SODIUM 1 % EX GEL
4.0000 g | Freq: Four times a day (QID) | CUTANEOUS | 0 refills | Status: DC
Start: 2020-10-15 — End: 2020-12-11

## 2020-10-15 NOTE — Progress Notes (Signed)
Language Spoken: English  Interpreter ID#:  n/a, fluent in patient's spoken language    HPI:   This is a 65 y.o. female here to establish care.     Multiple medications removed from list per patient request in Epic    Primary care in past but can't go back d/t insurance change    #Left foot swelling near pinky toe  -Allopurinol daily for gout   -Follows with Dr. Arita Miss but no additional intervention advised other than diet changes   -Onset: 06/2020  -Shoot pain but denies numbness/tingling  -Unrelieved by Tylenol and Advil    #Multiple skin abnormailites and history of melenoma  -Followed closely by Dermatology; note in Epic from 07/16/20  -Excisional (to include lymph node) biopsy 2015    #Hypothyroidism  -Levothyroxine 25 daily  -No recent TSH on file  -Sx hx? Denies     #HLD  -Atorvastatin 10mg  daily    #Gout  -Allopurinol daily  -PAST: Indomethacin PRN  -Right foot lateral aspect mass removed 2018    #HM  -Hep C not on file  -Colonoscopy in past (7 years ago) polyps found. States she is overdue for repeat. Denies [facility] of colon cancer.   -DENIES SI    Past Medical History:   Diagnosis Date   . Claustrophobia    . Hyperlipidemia    . Lipoma     base of posterior neck   . Low back pain    . Malignant neoplasm of skin     Melanoma on left shoulder back   . Plantar fasciitis, right    . Rash    . Seasonal allergies      Past Surgical History:   Procedure Laterality Date   . BIOPSY, SENTINEL NODE N/A 05/19/2014    Procedure: BIOPSY, SENTINEL NODE;  Surgeon: Lorenza Chick, MD;  Location: [facility] OR;  Service: General;  Laterality: N/A;   SENTINEL LYMPH NODE BIOSPY W/ NUC MED @ 8:00AM,    . EXCISION, FOOT LESION Right 12/04/2016    Procedure: EXCISION, FOOT LESION;  Surgeon: Cathie Hoops, DPM;  Location: [facility] OR;  Service: Podiatry;  Laterality: Right;  RIGHT FOOT SOFT TISSUE MASS RESECTION   . EXCISION, LIPOMA N/A 11/24/2014    Procedure: EXCISION, LIPOMA OF BASE OF NECK;  Surgeon: Lorenza Chick, MD;   Location: [facility];  Service: General;  Laterality: N/A;  BACK LIPOMA EXCISION   . EXCISION, MASS Left 04/18/2015    Procedure: EXCISION, MASS;  Surgeon: Lorenza Chick, MD;  Location: [facility] OR;  Service: General;  Laterality: Left;  LEFT THIGH MASS EXCISION   . EXCISION, MELANOMA Bilateral 05/19/2014    Procedure: EXCISION, MELANOMA;  Surgeon: Lorenza Chick, MD;  Location: [facility] OR;  Service: General;  Laterality: Bilateral;  LEFT SHOULDER MELANOMA WIDE LOCAL EXCISION, RIGHT MID-BACK ATYPICAL NEVUS EXCISION     . EYE SURGERY     . LASIK  2003   . SKIN BIOPSY       Family History   Problem Relation Age of Onset   . Hypertension Father    . Hyperlipidemia Father    . Aneurysm Father    . Heart disease Father         unsure of age   . Asthma Brother    . Hyperlipidemia Brother    . ADD / ADHD Daughter    . Asthma Daughter    . Alcohol abuse Maternal Uncle    . Alcohol abuse Maternal  Grandfather    . Breast cancer Maternal Grandmother    . Cancer Maternal Grandmother         breast   . Thyroid disease Sister      Social History     Tobacco Use   . Smoking status: Former Smoker     Packs/day: 1.00     Years: 30.00     Pack years: 30.00     Quit date: 10/29/2002     Years since quitting: 17.9   . Smokeless tobacco: Never Used   Substance Use Topics   . Alcohol use: Yes     Alcohol/week: 0.0 standard drinks     Comment: drinks wine nightly (2 glasses); very rare other alcohol   . Drug use: No       ALLERGIES:     Allergies   Allergen Reactions   . Ciprofloxacin Swelling and Rash   . Cefaclor Hives       MEDICATIONS:     Current Outpatient Medications:   .  allopurinol (ZYLOPRIM) 100 MG tablet, allopurinol 100 mg tablet, Disp: 90 tablet, Rfl: 0  .  atorvastatin (LIPITOR) 10 MG tablet, Take 1 tablet (10 mg total) by mouth daily, Disp: 90 tablet, Rfl: 0  .  diclofenac Sodium (VOLTAREN) 1 % Gel topical gel, Apply 4 g topically 4 (four) times daily, Disp: 350 g, Rfl: 0  .  escitalopram (LEXAPRO) 10 MG  tablet, Take 1 tablet (10 mg total) by mouth every 24 hours, Disp: 90 tablet, Rfl: 0  .  levothyroxine (SYNTHROID) 25 MCG tablet, levothyroxine 25 mcg tablet, Disp: 30 tablet, Rfl: 0  .  loratadine (CLARITIN) 10 MG tablet, Take 1 tablet (10 mg total) by mouth as needed for Allergies, Disp: 90 tablet, Rfl: 0  .  Omega-3 Fatty Acids (FISH OIL) 1200 MG CAPS, Take by mouth 2 (two) times daily.Essential Oil Omega Complex- 2tab qd  , Disp: , Rfl:   .  triamcinolone (KENALOG) 0.1 % cream, Apply topically 2 (two) times daily as needed (itchy rash) For up to 2 weeks at a time, Disp: 45 g, Rfl: 1  .  UNABLE TO FIND, Copaiba (Colpaifera Essential Oil- 1tab qd, Disp: , Rfl:   .  UNABLE TO FIND, Micro Plex (Food Nutrient Complex) Twice daily., Disp: , Rfl:   .  UNABLE TO FIND, Polyphenol Complex (deep blue) twice daily per pt., Disp: , Rfl:     PHYSICAL EXAM:   BP 147/82 (BP Site: Left arm, Patient Position: Sitting, Cuff Size: Small)   Pulse 75   Temp (!) 95.5 F (35.3 C) (Temporal)   Wt 57.6 kg (127 lb)   LMP 10/22/2012   SpO2 95%   BMI 23.23 kg/m     Physical Exam    General: awake, alert, oriented x 3  Cardiovascular: S1, S2, regular rate and rhythm, no murmurs, rubs, or gallops  Lungs: clear to auscultation bilaterally, without wheezing, rhonchi, or rales  Abdomen: soft, non tender, normal bowel sounds  Extremities: no clubbing, cyanosis, or edema  Skin: no rashes or lesions noted  FOOT: left foot lateral swelling from middle toes to pinky, does not extend past mid foot, free of erythema/heat/rash    DIAGNOSTICS:     Lab Results   Component Value Date    WBC 6.50 11/22/2014    HGB 12.8 11/22/2014    HCT 40.1 11/22/2014    PLT 197 11/22/2014    CHOL 276 (H) 11/22/2014    TRIG 423 (H)  11/22/2014    HDL 76 11/22/2014    LDL Not Done 11/22/2014    ALT 30 11/22/2014    AST 28 11/22/2014    NA 142 11/22/2014    K 4.2 11/22/2014    CL 104 11/22/2014    CREAT 0.7 11/22/2014    BUN 9.0 11/22/2014    CO2 27 11/22/2014    TSH  4.49 11/22/2014    INR 1.1 12/16/2013    GLU 107 (H) 11/22/2014    HGBA1C 5.3 11/22/2014       No results found.    ACTIVE PROBLEMS:     Patient Active Problem List   Diagnosis   . Hypertriglyceridemia   . Hyperlipidemia with target LDL less than 100   . Allergy   . Subclinical hypothyroidism   . Elevated blood pressure reading without diagnosis of hypertension   . Plantar fasciitis, bilateral   . Alcohol use disorder   . Macrocytosis   . Low vitamin B12 level   . Malignant melanoma of left shoulder   . Skin lesion   . Lipoma       ASSESSMENT and PLAN:   65 y.o. female      [name] [name] was seen today for foot swelling.    Diagnoses and all orders for this visit:    Mixed hyperlipidemia  -     Lipid panel; Future  -     atorvastatin (LIPITOR) 10 MG tablet; Take 1 tablet (10 mg total) by mouth daily  -     levothyroxine (SYNTHROID) 25 MCG tablet; levothyroxine 25 mcg tablet    Hypothyroidism, unspecified type  -     T4, free; Future  -     TSH; Future    Annual physical exam  -     Hemoglobin A1C; Future  -     Hepatitis C (HCV) antibody, Total; Future    Localized swelling of left foot  -     XR Foot Left AP And Lateral Standing; Future  -     Referral to Podiatric Surgery    Colon cancer screening  -     Ambulatory referral to Gastroenterology    Left foot pain  -     Referral to Podiatric Surgery  -     diclofenac Sodium (VOLTAREN) 1 % Gel topical gel; Apply 4 g topically 4 (four) times daily    Chronic gout of foot, unspecified cause, unspecified laterality  -     allopurinol (ZYLOPRIM) 100 MG tablet; allopurinol 100 mg tablet    Symptoms of depression  -     escitalopram (LEXAPRO) 10 MG tablet; Take 1 tablet (10 mg total) by mouth every 24 hours    Environmental allergies  -     loratadine (CLARITIN) 10 MG tablet; Take 1 tablet (10 mg total) by mouth as needed for Allergies      #Left foot pain/swelling  -Denies trauma  -Follows with Podiatry but per patient that physician did not give her conclusive dx or  treatment  -XRAY foot  -Referred to Podiatry Sx  -Topical diclofenac  -Cont allopurinol for gout  -Uric acid for gout ordered    #Depression  -Worse with foot issues  -Referred to therapy; declines at this time  -Denies SI  -Lexapro refilled    #Hypothyroidism   -Labs ordered  -Refilled levothyroxine but stressed importance to patient she must do labs    #HLD  -Lipid panel ordered    #HM  -Referred for colonoscopy   -  Patient agreeable to Hep C testing        Lendon Colonel NP

## 2020-10-15 NOTE — Patient Instructions (Addendum)
Sign papers so I can see primary care records   Complete xray of foot   Schedule an appointment podiatry surgeon   Schedule an appointment Gastroenterology for colonscopy   Apply diclofenac to foot as needed for pain  Complete blood tests. Make sure you are fasting  Follow-up after labs and xray are completed         Gout    Gout is an inflammation of a joint caused by an inflammatory response to gout crystals in the joint fluid. This occurs when there is excess uric acid. Uric acid is a normal waste product in the body. It builds up in the body when the kidneys can't filter enough of it from the blood. This may occur with age. It is also associated with kidney disease. Gout occurs more often in people with obesity, diabetes, high blood pressure, or high levels of fats in the blood. It may run in families. Gout tends to come and go. A flare up of gout is called an attack. Drinking alcohol or eating certain foods (such as shellfish or foods with additives such as high-fructose corn syrup) may increase uric acid levels in the blood and cause a gout attack.   During a gout attack, the affected joint may become hot, red, swollen, and painful. If you have had one attack of gout, you are likely to have another. An attack of gout can be treated with medicine. If these attacks become frequent, a daily medicine may be prescribed to help the kidneys remove uric acid from the body.   Home care  During a gout attack:  Contact your healthcare provider for advice and therapy options at the early stages of a gout flare. Treating the flare right away can prevent it from getting worse.  Rest painful joints. If gout affects the joints of your foot or leg, you may want to use crutches for the first few days to keep from bearing weight on the affected joint.  When sitting or lying down, raise the painful joint to a level higher than your heart.  Apply an ice pack (ice cubes in a plastic bag wrapped in a thin towel) over the injured  area for 20 minutes every 1 to 2 hours the first day for pain relief. Continue this 3 to 4 times a day for swelling and pain.  Avoid alcohol and foods listed below (see Preventing attacks) during a gout attack. Drink extra fluid to help flush the uric acid through your kidneys.  If you were prescribed a medicine to treat gout, take it as your healthcare provider has instructed. Don't skip doses.  Take anti-inflammatory medicine as directed by your healthcare provider.  If pain medicines have been prescribed, take them exactly as directed.    Preventing attacks  Limit or stop  alcohol use. Excess alcohol intake can cause a gout attack.  Limit these foods and beverages:  Organ meats, such as kidneys and liver  Certain seafoods (anchovies, sardines, shrimp, scallops, herring, mackerel)  [employer], meat extracts and meat gravies  Foods and beverages sweetened with high-fructose corn syrup, such as sodas  Eat a healthy diet including low-fat and nonfat dairy, whole grains, and vegetables.  If you are overweight, talk to your healthcare provider about a weight reduction plan. Avoid fasting or extreme low calorie diets (less than 900 calories per day). This will increase uric acid levels in the body.  If you have diabetes or high blood pressure, work with your doctor to manage  these conditions.  Protect the joint from injury. Wear good fitting socks and shoes. Injury can trigger a gout attack.    Follow-up care  Follow up with your healthcare provider, or as advised.   When to seek medical advice  Call your healthcare provider if you have any of the following:  Fever over 100.51F (38.C) with worsening joint pain  Increasing redness around the joint  Pain developing in another joint  Repeated vomiting, abdominal pain, or blood in the vomit or stool (black or red color)  [employer] last reviewed this educational content on 11/07/2017     2000-2022 [employer], [place]. All rights reserved. This information is not intended  as a substitute for professional medical care. Always follow your healthcare professional's instructions.

## 2020-10-16 ENCOUNTER — Ambulatory Visit
Admission: RE | Admit: 2020-10-16 | Discharge: 2020-10-16 | Disposition: A | Discharge status: Home / Self Care | Payer: Medicaid Managed Care Other | Source: Ambulatory Visit | Attending: Nurse Practitioner | Admitting: Nurse Practitioner

## 2020-10-16 DIAGNOSIS — R2242 Localized swelling, mass and lump, left lower limb: Secondary | ICD-10-CM | POA: Insufficient documentation

## 2020-10-17 ENCOUNTER — Other Ambulatory Visit (INDEPENDENT_AMBULATORY_CARE_PROVIDER_SITE_OTHER): Payer: Self-pay | Admitting: Nurse Practitioner

## 2020-10-18 ENCOUNTER — Telehealth (INDEPENDENT_AMBULATORY_CARE_PROVIDER_SITE_OTHER): Payer: Self-pay | Admitting: Nurse Practitioner

## 2020-10-18 LAB — HEMOGLOBIN A1C: Hemoglobin A1C: 5.2 % (ref 4.8–5.6)

## 2020-10-18 LAB — LIPID PANEL, WITHOUT TOTAL CHOLESTEROL/HDL RATIO, SERUM
Cholesterol: 193 mg/dL (ref 100–199)
HDL: 83 mg/dL (ref >39)
LDL Chol Calculated (NIH): 30 mg/dL (ref 0–99)
Triglycerides: 601 mg/dL (ref 0–149)
VLDL Calculated: 80 mg/dL — ABNORMAL HIGH (ref 5–40)

## 2020-10-18 LAB — SPECIMEN STATUS REPORT

## 2020-10-18 LAB — TSH: TSH: 4.09 u[IU]/mL (ref 0.450–4.500)

## 2020-10-18 LAB — HEPATITIS C ANTIBODY: HCV AB: <0.1 s/co ratio (ref 0.0–0.9)

## 2020-10-18 LAB — T4, FREE: T4, Free: 1.02 ng/dL (ref 0.82–1.77)

## 2020-10-18 NOTE — Telephone Encounter (Signed)
Left voicemail for patient to return phone call to schedule labs F/U appt

## 2020-10-30 ENCOUNTER — Telehealth (INDEPENDENT_AMBULATORY_CARE_PROVIDER_SITE_OTHER): Payer: Self-pay

## 2020-10-30 NOTE — Telephone Encounter (Addendum)
RN attempted to call pt today, she did not answer. RN LVM informing pt her X-ray did not show any fracture. She should follow-up with Dr. Arita Miss for the bone spur findings if she is still having pain and swelling. Call back number provided for any further questions or concerns.  ----- Message from Jon Gills, FNP sent at 10/30/2020 11:04 AM EDT -----  Cornelia Copa, She doesn't have fracture - she is seeing dr. Arita Miss . It sounds that he is podiatry - she can FU with him . Thanks   ----- Message -----  From: Berenice Bouton, RN  Sent: 10/30/2020  10:38 AM EDT  To: Jon Gills, FNP    Hi Tsion does this patient need to follow-up with [place] for her X-ray? Or should she just see podiatry for the bone spurs?

## 2020-11-05 NOTE — Progress Notes (Unsigned)
Podiatric Surgery Patient Visit    Patient Name: [name] [name], [name] [name]    Assessment:   Patient is 65 y.o. female with ***    PMH: plantar fasciitis, calustrophobia, HLD     Impression: {Number/Complexity Problems 2021 E&M:50022}    Plan:   - Initial consultation for ***  *** x-rays from *** show ***  *** x-rays obtained today show ***  Patient with findings consistent with***  Discussed clinically suspected diagnosis, and potential treatment options including activity modification with rest/immobilization as needed, anti-inflammatories, orthotics, physical therapy, importance of supportive shoe wear.   . Advised to use a CAM boot for temporary immobilization with WBAT, to be worn at all times when walking or standing.  Patient was fitted for a *** today  . Rest or stay off the injured foot.  Apply ice to the affected area 4x per day or PRN.  Elevate and use compression or an elastic bandage to reduce pain and swelling.    . Over the counter NSAIDs and/or Tylenol for pain as needed for pain.  The NSAID's should be taken with food.    . Plan on repeating *** x-rays at the next visit.        Follow up in ***, sooner if needed   Patient will call with any questions or concerns       ----------------------------------------------------------------------------------------------------------------------------    Amount/Complexity data reviewed (Choose 1 for level 2/3/4, 2 for level 5):     A) Independent Radiology interpretation: ***    B) (any 3):   New Radiology/Labs ordered: ***  Outside Radiology reports/Labs reviewed: ***  Involve Independent historian: ***  External provider notes reviewed: PCP progress notes reviewed     C) External provider discussion: ***    Risk of Problem/Management: {Problem Risk 2021 E&M:50024}    The visit was *** minutes of face to face time, of which >50% spent on counseling and/or coordination of care.  (Time based visits- 99203-30 min, 99204-45 min, 99205-60)      Subjective:     Chief  Complaint: No chief complaint on file.      HPI:  [name] [name] is a 65 y.o. female.  Presents to clinic with chief complaint of ***    On Allopurinol and as needed Indomethacin     The patient denies fever, chills, nausea, vomiting, diarrhea, chest pain or SOB.      Denies N/V/F/C/SOB/CP  All other systems were reviewed and are negative  The following portions of the patient's history were reviewed and updated as appropriate: allergies, current medications, past medical history, past surgical history, family history, and problem list.      Allergies:   Allergies   Allergen Reactions   . Ciprofloxacin Swelling and Rash   . Cefaclor Hives     Past Medical History:   Past Medical History:   Diagnosis Date   . Claustrophobia    . Hyperlipidemia    . Lipoma     base of posterior neck   . Low back pain    . Malignant neoplasm of skin     Melanoma on left shoulder back   . Plantar fasciitis, right    . Rash    . Seasonal allergies      Surgical History:   Past Surgical History:   Procedure Laterality Date   . BIOPSY, SENTINEL NODE N/A 05/19/2014    Procedure: BIOPSY, SENTINEL NODE;  Surgeon: Lorenza Chick, MD;  Location: [facility] OR;  Service: General;  Laterality: N/A;  SENTINEL LYMPH NODE BIOSPY W/ NUC MED @ 8:00AM,    . EXCISION, FOOT LESION Right 12/04/2016    Procedure: EXCISION, FOOT LESION;  Surgeon: Cathie Hoops, DPM;  Location: [facility] OR;  Service: Podiatry;  Laterality: Right;  RIGHT FOOT SOFT TISSUE MASS RESECTION   . EXCISION, LIPOMA N/A 11/24/2014    Procedure: EXCISION, LIPOMA OF BASE OF NECK;  Surgeon: Lorenza Chick, MD;  Location: [facility];  Service: General;  Laterality: N/A;  BACK LIPOMA EXCISION   . EXCISION, MASS Left 04/18/2015    Procedure: EXCISION, MASS;  Surgeon: Lorenza Chick, MD;  Location: [facility];  Service: General;  Laterality: Left;  LEFT THIGH MASS EXCISION   . EXCISION, MELANOMA Bilateral 05/19/2014    Procedure: EXCISION, MELANOMA;  Surgeon: Lorenza Chick, MD;  Location: [facility] OR;  Service: General;  Laterality: Bilateral;  LEFT SHOULDER MELANOMA WIDE LOCAL EXCISION, RIGHT MID-BACK ATYPICAL NEVUS EXCISION     . EYE SURGERY     . LASIK  2003   . SKIN BIOPSY       Tobacco Dependence:  Social History     Tobacco Use   Smoking Status Former Smoker   . Packs/day: 1.00   . Years: 30.00   . Pack years: 30.00   . Quit date: 10/29/2002   . Years since quitting: 18.0   Smokeless Tobacco Never Used         Physical Exam:   There were no vitals filed for this visit.    Gen: AAOx3, well-nourished, well-developed, in no acute distress  Psych: Mood and affect appropriate, pleasant, calm   Pain: ***/10  HEENT: Normocephalic, atraumatic, EOMI  Heart: Regular pulse rate and rhythm  Pulmonary: No signs of respiratory distress, effort normal   Vascular:  DP palpable, PT palpable. Brisk Cap refill < 3 sec to all digits, no edema, no varicosities.  No ischemia, advancing erythema, demarcation or other evidence of acute disease.  Derm:  Normal texture, turgor, and temperature. Skin intact, no open lesions or rashes, no interdigital maceration, no erythema or ecchymosis, no clinical signs of infection  MSK:   MMS 5/5 and symmetric, No other areas of pain or tenderness. No gross deformity. ROM normal without crepitus.  Neuro:  Gross sensation intact. Gross motor function intact.  Able to wiggle toes.  Negative tinel's sign      Foot Exam:  Left: {NMGDMFOOTGEN:37104}   Right: {NMGDMFOOTGEN:37104}     Standing and gait exam: Intact coordination and balance.  Normal gait.***      Blood Pressure Screening - Preventive Care and Screening for High BP and Follow-Up    {QM MIPS HTN - Specialists:55620::"Pt was informed that the blood pressure measured at today's office visit was elevated above goal. ","Pt counseled on Lifestyle factors including those such as a heart healthy diet, weight management, alcohol intake, and/or exercise.","Advised patient to Follow-up with PCP for further  monitoring and management of high blood pressure.":1}        Radiology:     Xrays taken in clinic today: *** x-rays in multiple standing views:   ***      10/16/20  XR Foot Left AP And Lateral Standing [[id number]] (Order [id number])  Status: Final result   Study Result  Narrative & Impression   Indication: Foot pain and swelling since January 2022.    FINDINGS: Weightbearing views. AP and lateral projection left foot. No  prior study. Small bone spurs at the first metatarsal phalangeal joint.  No fracture or osseous destruction. No evidence of a plantar bone spur.    IMPRESSION:    Small bone spurs at the first metatarsal phalangeal joint.  Otherwise study is within normal limits    Kinnie Feil, MD   10/16/2020 3:40 PM         Labs/microbiology:  Results reviewed               Marlana Salvage. Stephan Minister MSN, FNP-BC  [facility]    ------------------------------------------------------------------------------------------------------------------------------  The review of the patient's medications does not in any way constitute an endorsement, by this clinician,  of their use, dosage, indications, route, efficacy, interactions, or other clinical parameters.  This note was generated within the EPIC EMR using Dragon medical speech recognition software and may contain inherent errors or omissions not intended by the user. Grammatical and punctuation errors, random word insertions, deletions, pronoun errors and incomplete sentences are occasional consequences of this technology due to software limitations. Not all errors are caught or corrected.  Although every attempt is made to root out erroneus and incomplete transcription, the note may still not fully represent the intent or opinion of the author. If there are questions or concerns about the content of this note or information contained within the body of this dictation they should be addressed directly with the author for  clarification.

## 2020-11-06 ENCOUNTER — Encounter (INDEPENDENT_AMBULATORY_CARE_PROVIDER_SITE_OTHER): Payer: Self-pay | Admitting: Nurse Practitioner

## 2020-11-06 ENCOUNTER — Ambulatory Visit (INDEPENDENT_AMBULATORY_CARE_PROVIDER_SITE_OTHER): Payer: Medicaid Managed Care Other | Admitting: Nurse Practitioner

## 2020-11-06 VITALS — BP 129/73 | HR 91 | Resp 16

## 2020-11-06 DIAGNOSIS — R200 Anesthesia of skin: Secondary | ICD-10-CM

## 2020-11-06 DIAGNOSIS — M84375A Stress fracture, left foot, initial encounter for fracture: Secondary | ICD-10-CM

## 2020-11-06 DIAGNOSIS — Z8739 Personal history of other diseases of the musculoskeletal system and connective tissue: Secondary | ICD-10-CM

## 2020-11-06 DIAGNOSIS — R208 Other disturbances of skin sensation: Secondary | ICD-10-CM

## 2020-11-06 DIAGNOSIS — E559 Vitamin D deficiency, unspecified: Secondary | ICD-10-CM

## 2020-11-16 ENCOUNTER — Emergency Department
Admission: EM | Admit: 2020-11-16 | Discharge: 2020-11-16 | Disposition: A | Discharge status: Home / Self Care | Payer: Medicaid Managed Care Other | Attending: Student in an Organized Health Care Education/Training Program | Admitting: Student in an Organized Health Care Education/Training Program

## 2020-11-16 ENCOUNTER — Emergency Department: Payer: Medicaid Managed Care Other

## 2020-11-16 DIAGNOSIS — W2201XA Walked into wall, initial encounter: Secondary | ICD-10-CM | POA: Insufficient documentation

## 2020-11-16 DIAGNOSIS — S42291A Other displaced fracture of upper end of right humerus, initial encounter for closed fracture: Secondary | ICD-10-CM | POA: Insufficient documentation

## 2020-11-16 DIAGNOSIS — S42211A Unspecified displaced fracture of surgical neck of right humerus, initial encounter for closed fracture: Secondary | ICD-10-CM

## 2020-11-16 MED ORDER — LIDOCAINE-EPINEPHRINE 1 %-1:100000 IJ SOLN
30.0000 mL | Freq: Once | INTRAMUSCULAR | Status: DC
Start: 2020-11-16 — End: 2020-11-16

## 2020-11-16 MED ORDER — MORPHINE SULFATE 10 MG/ML IJ/IV SOLN (WRAP)
6.0000 mg | Freq: Once | Status: AC
Start: 2020-11-16 — End: 2020-11-16
  Administered 2020-11-16: 6 mg via INTRAMUSCULAR
  Filled 2020-11-16: qty 1

## 2020-11-16 MED ORDER — OXYCODONE HCL 5 MG PO TABS
5.0000 mg | ORAL_TABLET | ORAL | 0 refills | Status: AC | PRN
Start: 2020-11-16 — End: 2020-11-23

## 2020-11-16 NOTE — ED Triage Notes (Signed)
PT IN A WALKING BOOT, RAN RIGHT SHOULDER INTO A WALL. APPEARS DISLOCATED. INJURY 1 HOUR AGO. LAST PO INTAKE AT 8PM.

## 2020-11-16 NOTE — Discharge Instructions (Addendum)
Please use Tylenol and ibuprofen alternating every 3-4 hours for baseline pain control, and oxycodone for breakthrough pain

## 2020-11-26 NOTE — ED Provider Notes (Signed)
EMERGENCY DEPARTMENT HISTORY AND PHYSICAL EXAM     None        Date: 11/16/2020  Patient Name: [name] [name]  Attending Physician: Iva Lento, MD    History of Presenting Illness       History Provided By: Patient    Chief Complaint:  Chief Complaint   Patient presents with    Shoulder Injury     Additional History: [name] [name] is a 65 y.o. female presenting to the ED with shoulder pain.  Patient is in a walking boot, may have slipped and/or ran awkwardly and ran her right shoulder into a wall.  Occurred approximately 1 hour prior to arrival.  Patient had immediate onset of significant upper arm, shoulder pain, no longer able to move her right shoulder.  No radiation of pain.  Has not taken anything for the symptoms at home.  Pain worsens with any ambulation, mild movement of your right shoulder, arm.  No numbness, tingling, weakness in hand, wrist, elbow.  No pain, traumatic injuries anywhere else including head trauma.  Pain is constant.  No alleviating factors noted.    PCP: Wendelyn Breslow, FNP  SPECIALISTS:    No current facility-administered medications for this encounter.     Current Outpatient Medications   Medication Sig Dispense Refill    allopurinol (ZYLOPRIM) 100 MG tablet allopurinol 100 mg tablet 90 tablet 0    atorvastatin (LIPITOR) 10 MG tablet Take 1 tablet (10 mg total) by mouth daily 90 tablet 0    diclofenac Sodium (VOLTAREN) 1 % Gel topical gel Apply 4 g topically 4 (four) times daily 350 g 0    escitalopram (LEXAPRO) 10 MG tablet Take 1 tablet (10 mg total) by mouth every 24 hours 90 tablet 0    levothyroxine (SYNTHROID) 25 MCG tablet levothyroxine 25 mcg tablet 30 tablet 0    loratadine (CLARITIN) 10 MG tablet Take 1 tablet (10 mg total) by mouth as needed for Allergies 90 tablet 0    Omega-3 Fatty Acids (FISH OIL) 1200 MG CAPS Take by mouth 2 (two) times daily.Essential Oil Omega Complex- 2tab qd          triamcinolone (KENALOG) 0.1 % cream Apply topically 2 (two) times  daily as needed (itchy rash) For up to 2 weeks at a time 45 g 1    UNABLE TO FIND Copaiba (Colpaifera Essential Oil- 1tab qd      UNABLE TO FIND Micro Plex (Food Nutrient Complex) Twice daily.      UNABLE TO FIND Polyphenol Complex (deep blue) twice daily per pt.         Past History     Past Medical History:  Past Medical History:   Diagnosis Date    Claustrophobia     Hyperlipidemia     Lipoma     base of posterior neck    Low back pain     Malignant neoplasm of skin     Melanoma on left shoulder back    Plantar fasciitis, right     Rash     Seasonal allergies        Past Surgical History:  Past Surgical History:   Procedure Laterality Date    BIOPSY, SENTINEL NODE N/A 05/19/2014    Procedure: BIOPSY, SENTINEL NODE;  Surgeon: Lorenza Chick, MD;  Location: [facility] OR;  Service: General;  Laterality: N/A;   SENTINEL LYMPH NODE BIOSPY W/ NUC MED @ 8:00AM,     EXCISION, FOOT LESION Right 12/04/2016  Procedure: EXCISION, FOOT LESION;  Surgeon: Cathie Hoops, DPM;  Location: [facility] OR;  Service: Podiatry;  Laterality: Right;  RIGHT FOOT SOFT TISSUE MASS RESECTION    EXCISION, LIPOMA N/A 11/24/2014    Procedure: EXCISION, LIPOMA OF BASE OF NECK;  Surgeon: Lorenza Chick, MD;  Location: [facility];  Service: General;  Laterality: N/A;  BACK LIPOMA EXCISION    EXCISION, MASS Left 04/18/2015    Procedure: EXCISION, MASS;  Surgeon: Lorenza Chick, MD;  Location: [facility] OR;  Service: General;  Laterality: Left;  LEFT THIGH MASS EXCISION    EXCISION, MELANOMA Bilateral 05/19/2014    Procedure: EXCISION, MELANOMA;  Surgeon: Lorenza Chick, MD;  Location: [facility] OR;  Service: General;  Laterality: Bilateral;  LEFT SHOULDER MELANOMA WIDE LOCAL EXCISION, RIGHT MID-BACK ATYPICAL NEVUS EXCISION      EYE SURGERY      LASIK  2003    SKIN BIOPSY         Family History:  Family History   Problem Relation Age of Onset    Hypertension Father     Hyperlipidemia Father     Aneurysm Father     Heart  disease Father         unsure of age    Asthma Brother     Hyperlipidemia Brother     ADD / ADHD Daughter     Asthma Daughter     Alcohol abuse Maternal Uncle     Alcohol abuse Maternal Grandfather     Breast cancer Maternal Grandmother     Cancer Maternal Grandmother         breast    Thyroid disease Sister        Social History:  Social History     Tobacco Use    Smoking status: Former     Packs/day: 1.00     Years: 30.00     Pack years: 30.00     Types: Cigarettes     Quit date: 10/29/2002     Years since quitting: 18.0    Smokeless tobacco: Never   Vaping Use    Vaping Use: Never used   Substance Use Topics    Alcohol use: Yes     Alcohol/week: 0.0 standard drinks     Comment: drinks wine nightly (2 glasses); very rare other alcohol    Drug use: Yes     Comment: MARIJUANA       Allergies:  Allergies   Allergen Reactions    Ciprofloxacin Swelling and Rash    Cefaclor Hives       Review of Systems     Review of Systems   Musculoskeletal:  Negative for neck pain.        Shoulder pain   Neurological:  Negative for headaches.   All other systems reviewed and are negative.    Physical Exam     Vitals:    11/16/20 0433   BP: 131/65   Pulse: 73   Resp: 18   Temp: 97.5 F (36.4 C)   TempSrc: Oral   SpO2: 99%   Weight: 59.4 kg   Height: 5\' 2"  (1.575 m)     Constitutional: Appears uncomfortable, in pain  HENT: Atraumatic, normocephalic, b/l external ears normal  Eyes: EOMI, no scleral icterus b/l  Mouth: Clear, moist, no erythema, exudate  Cardiovascular: Well perfused, no edema, no cyanosis  Respiratory: No increased work of breathing, no evidence of respiratory distress  GI: Abdomen non distended, no rebound, guarding  Skin: Dry, warm, no lesions  MSK: Right shoulder swelling, with step off deformity at glenohumeral joint. Right hand/wrist ROM intact.  Neuro: Right elbow, forearm, wrist, finger sensation intact, range of motion intact, no weakness.  Alert, oriented, at baseline, no gross neurologic deficit, moving all  extremities, GCS 15  Psych: Appropriate mood, behavior, thought process    Diagnostic Study Results     Labs -     Results       ** No results found for the last 24 hours. **            Radiologic Studies -   Radiology Results (24 Hour)       Procedure Component Value Units Date/Time    Shoulder Right 2+ Views [[id number]] Collected: 11/16/20 0503    Order Status: Completed Updated: 11/16/20 0506    Narrative:      HISTORY: r/o dislocation.    COMPARISON: 06/04/2009    TECHNIQUE: XR SHOULDER RIGHT 2+ VIEWS    FINDINGS:  There is a comminuted fracture of the right humeral head and neck with  good alignment of the fracture fragments. No dislocation.  The soft  tissues are normal.      Impression:        Right humeral head and neck fracture.    Jorene Guest, MD   11/16/2020 5:04 AM        .    Medical Decision Making   I am the first provider for this patient.    I reviewed the vital signs, available nursing notes, past medical history, past surgical history, family history and social history.    Vital Signs-Reviewed the patient's vital signs.     Vitals:    11/16/20 0433   BP: 131/65   Pulse: 73   Resp: 18   Temp: 97.5 F (36.4 C)   TempSrc: Oral   SpO2: 99%   Weight: 59.4 kg   Height: 5\' 2"  (1.575 m)       Pulse Oximetry Analysis - Normal 99% on RA    Procedures:   Procedures    ED Course:   ED Course as of 11/26/20 1052   Mon Nov 26, 2020   1051 This is a 65 year old female who presented to the ED with shoulder, upper arm pain.  Patient neurovascularly intact, no evidence of open fracture.  Shoulder x-ray showed a humeral head and neck fracture, no dislocation.  Fracture appears in good anatomic alignment, as such is suitable for discharge with Ortho follow-up.  Patient given 1 dose of morphine for initial pain control, placed in sling.  Prescription given for oxycodone.  Referral given to orthopedics.  Return precautions given.  Discharged home in stable condition. [MV]      ED Course User Index  [MV] Iva Lento, MD           Diagnosis     Clinical Impression:   1. Fx humeral neck, right, closed, initial encounter        Treatment Plan:   ED Disposition       ED Disposition   Discharge    Condition   --    Date/Time   Fri Nov 16, 2020  5:28 AM    Comment   [name] [name] discharge to home/self care.    Condition at disposition: Juanito Doom, MD  11/26/20 1052

## 2020-11-27 ENCOUNTER — Ambulatory Visit (INDEPENDENT_AMBULATORY_CARE_PROVIDER_SITE_OTHER): Payer: Medicaid Managed Care Other | Admitting: Internal Medicine

## 2020-12-03 ENCOUNTER — Ambulatory Visit (INDEPENDENT_AMBULATORY_CARE_PROVIDER_SITE_OTHER): Payer: Medicaid Managed Care Other | Admitting: Internal Medicine

## 2020-12-11 ENCOUNTER — Other Ambulatory Visit (INDEPENDENT_AMBULATORY_CARE_PROVIDER_SITE_OTHER): Payer: Self-pay | Admitting: Nurse Practitioner

## 2020-12-11 DIAGNOSIS — M79672 Pain in left foot: Secondary | ICD-10-CM

## 2020-12-11 DIAGNOSIS — E782 Mixed hyperlipidemia: Secondary | ICD-10-CM

## 2020-12-20 ENCOUNTER — Ambulatory Visit (INDEPENDENT_AMBULATORY_CARE_PROVIDER_SITE_OTHER): Payer: Medicaid Managed Care Other | Admitting: Foot & Ankle Surgery

## 2023-04-01 ENCOUNTER — Ambulatory Visit: Payer: No Typology Code available for payment source | Admitting: Dermatology

## 2023-10-02 LAB — COLOGUARD: Cologuard Result: NEGATIVE

## 2023-11-13 ENCOUNTER — Telehealth: Payer: Self-pay

## 2023-11-13 NOTE — Telephone Encounter (Signed)
 [facility] New Patient Coordinator Note:    11/13/2023      Diagnosis Info:    H/o melanoma      Referring Provider Info:            Imaging / Medical Records Info:             Appointment Info:     Spoke with patient and scheduled with Dr. Rubie

## 2023-12-23 ENCOUNTER — Ambulatory Visit: Payer: Self-pay | Attending: Dermatology | Admitting: Dermatology

## 2023-12-23 VITALS — BP 104/68 | HR 85 | Temp 97.0°F | Resp 16 | Ht 61.2 in | Wt 131.6 lb

## 2023-12-23 DIAGNOSIS — D485 Neoplasm of uncertain behavior of skin: Secondary | ICD-10-CM | POA: Insufficient documentation

## 2023-12-23 DIAGNOSIS — Z1283 Encounter for screening for malignant neoplasm of skin: Secondary | ICD-10-CM | POA: Insufficient documentation

## 2023-12-23 DIAGNOSIS — D229 Melanocytic nevi, unspecified: Secondary | ICD-10-CM | POA: Insufficient documentation

## 2023-12-23 DIAGNOSIS — Z85820 Personal history of malignant melanoma of skin: Secondary | ICD-10-CM | POA: Insufficient documentation

## 2023-12-23 DIAGNOSIS — Z85828 Personal history of other malignant neoplasm of skin: Secondary | ICD-10-CM | POA: Insufficient documentation

## 2023-12-23 NOTE — Progress Notes (Signed)
 [facility]    CC: Melanoma follow-up, Total body skin examination (TBSE)    HPI:    Interval-  Presents today for Total body skin examination (TBSE). Today patient denies any new, changing, itching, peeling, bleeding, painful lesions. Denies enlarged or tender lymph nodes. Feels well overall. 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    Last TBSE was with Dr. Rubie in February 2022, at which time the following lesions were biopsied:  -Right upper lip- superficial and nodular BCC, + deep, excised with Dr. Roz.  -Right lateral upper thigh- mild DN, + deep focal.     Detailed past-  [name] [name] is 68 y.o. female with a history of Stage 1B Left posterior shoulder. Patient noted a growing mole which became like a pinkish/purpleish blister  over  5 months, + bleeding. Saw her dermatologist Dr. Becki who recommended excisional biopsy which was performed by Dr. Janet on 04/06/14. Pathology showed 1.47 mm thick, non-ulcerated melanoma with 2 mitoses/mm2. Peripheral margin was close with melanoma in situ. She underwent wide local excision and sentinel lymph node biopsy on 05/19/14 which showed no residual melanoma at primary site and two negative left supraclavicular lymph nodes.      Current Outpatient Medications   Medication Sig Dispense Refill    allopurinol  (ZYLOPRIM ) 100 MG tablet allopurinol  100 mg tablet 90 tablet 0    atorvastatin  (LIPITOR) 10 MG tablet Take 1 tablet (10 mg total) by mouth daily 90 tablet 0    escitalopram  (LEXAPRO ) 10 MG tablet Take 1 tablet (10 mg total) by mouth every 24 hours 90 tablet 0    levothyroxine  (SYNTHROID ) 25 MCG tablet TAKE ONE TABLET (25BY MOUTH ONE TIME DAILY 30 tablet 0    loratadine  (CLARITIN ) 10 MG tablet Take 1 tablet (10 mg total) by  mouth as needed for Allergies 90 tablet 0     No current facility-administered medications for this visit.     Allergies   Allergen Reactions    Ciprofloxacin Swelling and Rash    Cefaclor Hives     Past Medical History:   Diagnosis Date    Claustrophobia     Hyperlipidemia     Lipoma     base of posterior neck    Low back pain     Malignant neoplasm of skin     Melanoma on left shoulder back    Plantar fasciitis, right     Rash     Seasonal allergies      Past Surgical History:   Procedure Laterality Date    BIOPSY, SENTINEL NODE N/A 05/19/2014    Procedure: BIOPSY, SENTINEL NODE;  Surgeon: Lehman Burkes, MD;  Location: [facility] OR;  Service: General;  Laterality: N/A;   SENTINEL LYMPH NODE BIOSPY W/ NUC MED @ 8:00AM,     EXCISION, FOOT LESION Right 12/04/2016    Procedure: EXCISION, FOOT LESION;  Surgeon: Ewing Lamar Lin, DPM;  Location: [facility] OR;  Service: Podiatry;  Laterality: Right;  RIGHT FOOT SOFT TISSUE MASS RESECTION    EXCISION, LIPOMA N/A 11/24/2014    Procedure: EXCISION, LIPOMA OF BASE OF NECK;  Surgeon: Lehman Burkes, MD;  Location:  [facility];  Service: General;  Laterality: N/A;  BACK LIPOMA EXCISION    EXCISION, MASS Left 04/18/2015  Procedure: EXCISION, MASS;  Surgeon: Lehman Burkes, MD;  Location: [facility];  Service: General;  Laterality: Left;  LEFT THIGH MASS EXCISION    EXCISION, MELANOMA Bilateral 05/19/2014    Procedure: EXCISION, MELANOMA;  Surgeon: Lehman Burkes, MD;  Location: [facility] OR;  Service: General;  Laterality: Bilateral;  LEFT SHOULDER MELANOMA WIDE LOCAL EXCISION, RIGHT MID-BACK ATYPICAL NEVUS EXCISION      EYE SURGERY      LASIK  2003    SKIN BIOPSY       Family History   Problem Relation Name Age of Onset    Hypertension Father      Hyperlipidemia Father      Aneurysm Father      Heart disease Father          unsure of age    Asthma Brother      Hyperlipidemia Brother      ADD / ADHD Daughter      Asthma Daughter      Alcohol abuse  Maternal Uncle      Alcohol abuse Maternal Grandfather      Breast cancer Maternal Grandmother      Cancer Maternal Grandmother          breast    Thyroid disease Sister       Social History     Socioeconomic History    Marital status: Single   Tobacco Use    Smoking status: Former     Current packs/day: 0.00     Average packs/day: 1 pack/day for 30.0 years (30.0 ttl pk-yrs)     Types: Cigarettes     Start date: 10/28/1972     Quit date: 10/29/2002     Years since quitting: 21.1    Smokeless tobacco: Never   Vaping Use    Vaping status: Never Used   Substance and Sexual Activity    Alcohol use: Yes     Alcohol/week: 0.0 standard drinks of alcohol     Comment: drinks wine nightly (2 glasses); very rare other alcohol    Drug use: Yes     Comment: MARIJUANA    Sexual activity: Yes     Partners: Male     Birth control/protection: None     Social Drivers of Psychologist, prison and probation services Strain: High Risk (12/22/2023)    Overall Financial Resource Strain (CARDIA)     Difficulty of Paying Living Expenses: Very hard   Food Insecurity: Food Insecurity Present (12/22/2023)    Hunger Vital Sign     Worried About Running Out of Food in the Last Year: Sometimes true     Ran Out of Food in the Last Year: Sometimes true   Transportation Needs: No Transportation Needs (12/22/2023)    PRAPARE - Therapist, art (Medical): No     Lack of Transportation (Non-Medical): No   Physical Activity: Insufficiently Active (12/22/2023)    Exercise Vital Sign     Days of Exercise per Week: 2 days     Minutes of Exercise per Session: 60 min   Stress: Stress Concern Present (12/22/2023)    [employer] of Occupational Health - Occupational Stress Questionnaire     Feeling of Stress : Very much   Social Connections: Socially Isolated (12/22/2023)    Social Connection and Isolation Panel     Frequency of Communication with Friends and Family: Once a week     Frequency of Social Gatherings with Friends and  Family: Once a week      Attends Religious Services: More than 4 times per year     Active Member of Clubs or Organizations: No     Attends Banker Meetings: Patient declined     Marital Status: Never married   Intimate Partner Violence: Not At Risk (12/22/2023)    Humiliation, Afraid, Rape, and Kick questionnaire     Fear of Current or Ex-Partner: No     Emotionally Abused: No     Physically Abused: No     Sexually Abused: No   Housing Stability: Unknown (12/22/2023)    Housing Stability Vital Sign     Unable to Pay for Housing in the Last Year: No     Homeless in the Last Year: No     REVIEW OF SYSTEMS  Const - no fever no chills   Resp- no cough   Neuro - no headache   Abd - no pain   Integ - see HPI    All other systems reviewed and negative      Objective:   BP 104/68 (BP Site: Left arm, Patient Position: Sitting, Cuff Size: Medium)   Pulse 85   Temp 97 F (36.1 C) (Oral)   Resp 16   Ht 1.554 m (5' 1.2)   Wt 59.7 kg (131 lb 9.6 oz)   LMP 10/22/2012   SpO2 98%   BMI 24.70 kg/m     Constitutional: General appearance: NAD, conversant    HEENT: normocephalic, atraumatic. Conjunctiva, lids, lips, examined with no suspicious lesions    SKIN:A total body skin examination is completed including:   - palpation of scalp and inspection of hair of scalp, eyebrows, face, chest and extremities   - inspection of eccrine and apocrine glands.   - inspection and/or palpation of the skin and subcutaneous tissues with the following anatomic skin sites examined with the following findings:   Head including face -   Right upper lip- 2 cm well healed scar, no evidence of recurrence on exam (BCC 2022)   Neck  - no lesions of concern  Chest including breasts, and axillae - no lesions of concern  Abdomen -   Left abdomen superior and lateral to umbilicus- Uniformly dark brown papule, measuring 4 x 4 mm, minimally elevated   L mid flank- 7 x 4 mm (previously 7 mm x 3 mm) dark brown macule, reticular network  with new darker area centrally,  skin lesion removal   Right lateral mid back- 5 x 3 mm brown macule, new vs. Photos, skin lesion removal   Genitalia, groin, buttocks -   Right lateral buttock- 8 x 4 mm tan macule  Right upper buttock- 3 x 3 mm dark brown macule  Back -   Posterior base of neck-healed scar (excised lipoma site)  Midline upper back- 5 x 5 mm light brown macule with darker brown area of pigment superiorly (measures 1 x 2 mm), few hairs coming out and breaking up pigment (stable compared to photos in Mirror, monitor)  Right upper extremity -   Right axilla- 3 x 2 mm brown macule (stable, continue to monitor)  Right lateral deltoid 7 cm lipoma soft tissue nodule   Right 4th finger- 3 x 2 mm tan macule, darker center   Right lateral 5th finger- 5 x 3 mm tan macule, darker center   Left upper extremity -   posterior shoulder 6 cm well healed scar (MM site 10/15) no pigmentation or nodularity;  outer mid arm lipoma 2cm   Right lower extremity -   Right upper outer thigh- 8 x 8 mm tan papule  Left lower extremity -   Left outer thigh - transverse scar 10cm (lipoma excision)- posterior aspect is a 4mm heart shaped dark brown macule (stable)     - Dermatoscopy was used to evaluate the nevi.   - General skin: multiple brown macules, size 2-57mm, approximately 50 in number, distributed primarily on the trunk. Extensive photodamage and rhytids    LYMPHATIC:Examination of lymph node basins is completed, including the H and N, axilla and groin. There is no lymphadenopathy.    NECK: supple, no masses    CVS: no swelling, varicosities or edema    EXTREMITIES: inspection and palpation of digits and nails revealed no abnormalities    NEURO/PSYCH: alert and oriented, pleasant      Pathology reports   Date 04/06/14  Accession Number ([id number])  Location Lt Post Shoulder  Dx MM   Clark Level IV   Thickness 1.36mm   Mitotic Rate 2 per mm   Ulceration Absent   Regression Absent     Procedure: Skin Lesion Removal (SLR)   Location: left mid flank, size  7 mm   After discussing the benefits and risks of a SLR including pain, infection, bleeding and/ or scarring, informed consent was obtained to perform a SLR. A time out was taken to confirm the patient's name, date of birth and anatomic site of the lesion. The area was then prepped with alcohol in the usual sterile fashion and anesthetized with approximately 0.5 cc of 1% lidocaine  with epinephrine . SLR performed with dermablade. Tissue is removed extending at least into the mid-dermis and around the entirety of the lesion. Hemostasis was controlled with aluminum chloride solution/electrodesiccation. The site was dressed with hydrated petrolatum and a band-aide. The patient tolerated the procedure well and was provided verbal and written wound care instructions. The patient will be notified of the results in 1-2 weeks    Procedure: Skin Lesion Removal (SLR)   Location: right lateral mid back, size 5 mm   After discussing the benefits and risks of a SLR including pain, infection, bleeding and/ or scarring, informed consent was obtained to perform a SLR. A time out was taken to confirm the patient's name, date of birth and anatomic site of the lesion. The area was then prepped with alcohol in the usual sterile fashion and anesthetized with approximately 0.5 cc of 1% lidocaine  with epinephrine . SLR performed with dermablade. Tissue is removed extending at least into the mid-dermis and around the entirety of the lesion. Hemostasis was controlled with aluminum chloride solution/electrodesiccation. The site was dressed with hydrated petrolatum and a band-aide. The patient tolerated the procedure well and was provided verbal and written wound care instructions. The patient will be notified of the results in 1-2 weeks      Assessment & Plan:   68 y.o. female with a history of Stage IB Left posterior shoulder 04/06/14 s/p WLE and negative SLNB. Presents for Total body skin examination (TBSE).    1.  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 Hx of BCC right upper lip 2022 excised  -no evidence of recurrence on exam     3. Neoplasm of  uncertain behavior of skin  -skin lesion removal x 2, both r/o DN vs. MM    4. Multiple nevi  -Previous photos of back used to compare nevi- stable  -No other lesions warranting biopsy    5. Skin cancer screening  - total body skin examination performed today    - A few lesions have been documented as above and will be followed   - Sun protection reviewed   - ABCDEs of melanoma reviewed   - Monthly self-skin examination was advised and the patient was instructed to return to clinic prior to scheduled visit if any suspicious or worrisome findings are discovered    6. RTC 1 year for TBSE- can see MD or APP    Lorane Metro, DNP, FNP-BC  Nurse Practitioner, [facility]  [facility]  [facility]    I personally reviewed the patients history, pathology when relevant, and completed an independent skin examination. I agree with all components of this written note. I have made addendums where/if needed.    Avelina Raman, MD, FAAD  Melanoma and Skin Cancer Specialist  [facility]  [facility]   [address]  [place], [place] [address]  T [phone]  F [phone]     45  mins of time was spent in total preparing to see the patient, reviewing prior notes, performing the full body skin and lymph node examination, using dermoscopy to evaluate skin lesions, ordering medications/tests/procedures (such as skin biopsies or cryotherapy), counseling and educating the patient on sun protection and the importance of self-skin examination,  documenting the clinical information and coordinating future care and follow up.

## 2023-12-25 LAB — SURGICAL PATHOLOGY

## 2023-12-28 ENCOUNTER — Telehealth: Payer: Self-pay

## 2023-12-28 NOTE — Telephone Encounter (Signed)
 Called patient to review pathology results. Left voicemail to return call at earliest convenience.

## 2023-12-29 NOTE — Telephone Encounter (Signed)
 The results of the biopsy were reviewed with the patient per Dr. Rubie.    Specimen A: The result is atypical mole: moderate atypia, margins are involved. The next step is monitor for recurrence. and Specimen B: The result is atypical mole: moderate atypia, margins are involved. The next step is monitor for recurrence. Patient verbalized understanding.    Patient did not have any questions at this time.      Results for orders placed or performed in visit on 12/23/23 (from the past 720 hours)   Surgical Pathology   Result Value    Case Report      Surgical Pathology                                Case: [id number]                                 Authorizing Provider:  Rubie Macintosh, MD        Collected:           12/23/2023 1533              Ordering Location:     [facility] Received:            12/23/2023 1533                                     [facility]                                                                             Oncology                                                                     Pathologist:           Chauncey Reyes BIRCH, MD                                                       Specimens:   A) - Skin (Specify if applicable), LEFT MID FLANK                                                   B) - Skin (Specify if applicable), RIGHT MID BACK  Final Diagnosis      A. 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      Clinical Information      Diagnosis:  D48.5 - Neoplasm of uncertain behavior of skin [ICD-10-CM]    see comment    Post-op Diagnosis:  No Dx found.               Gross Description       A. Skin (Specify if applicable).  The specimen is received labeled [phone] [name] [name], MRN [id number], DOB [birth year].    Specimen received in formalin is labeled  left mid flank, consist of 1 irregular fragment of skin biopsy measuring 0.6 x 0.5 x 0.1 cm with a central lesion measuring 0.5 x 0.3 cm.  The deep margin is inked, specimen is bisected and entirely submitted in 1 cassette.                       Pathologist Assistant: FRUTOSO, FLOYD  B. Skin (Specify if applicable).  The specimen is received labeled [phone] [name] [name], MRN [id number], DOB February 27, 1956.    Specimen received in formalin is labeled  right mid back, consist of 1 irregular fragment of skin biopsy measuring 0.6 x 0.5 x 0.1 cm with a central lesion measuring 0.4 x 0.3 cm.  The deep margin is inked, specimen is bisected and entirely submitted in 1 cassette.                     Pathologist Assistant: FRUTOSO FLOYD      Disclaimer      When stains are performed, all control slides are reviewed and found to stain appropriately.  Some stains and controls used in arriving at the diagnosis (if any) were prepared at [facility] and/or an outside reference laboratory using reagents that have not been cleared or approved by the U.[place]. Food and Drug Administration.  These were developed, and their performance characteristics determined by [facility] and/or an outside reference laboratory. Their use does not require FDA approval.    Please note that some or all of this record was generated using voice recognition software. If there are any questions about the content of this document, please contact the author as some errors in transcription may have occurred.

## 2024-12-26 ENCOUNTER — Ambulatory Visit: Payer: Self-pay | Admitting: Dermatology
